# Patient Record
Sex: Female | Born: 1985
Health system: Southern US, Community
[De-identification: ages and names within clinical notes are randomized; demographics above are authoritative.]

## PROBLEM LIST (undated history)

## (undated) ENCOUNTER — Inpatient Hospital Stay (HOSPITAL_COMMUNITY): Payer: Self-pay

## (undated) DIAGNOSIS — D573 Sickle-cell trait: Secondary | ICD-10-CM

## (undated) DIAGNOSIS — R87629 Unspecified abnormal cytological findings in specimens from vagina: Secondary | ICD-10-CM

## (undated) DIAGNOSIS — I82409 Acute embolism and thrombosis of unspecified deep veins of unspecified lower extremity: Secondary | ICD-10-CM

## (undated) DIAGNOSIS — D571 Sickle-cell disease without crisis: Secondary | ICD-10-CM

## (undated) DIAGNOSIS — D649 Anemia, unspecified: Secondary | ICD-10-CM

## (undated) DIAGNOSIS — K219 Gastro-esophageal reflux disease without esophagitis: Secondary | ICD-10-CM

## (undated) DIAGNOSIS — F32A Depression, unspecified: Secondary | ICD-10-CM

## (undated) DIAGNOSIS — F419 Anxiety disorder, unspecified: Secondary | ICD-10-CM

## (undated) DIAGNOSIS — L309 Dermatitis, unspecified: Secondary | ICD-10-CM

## (undated) HISTORY — DX: Anemia, unspecified: D64.9

## (undated) HISTORY — DX: Gastro-esophageal reflux disease without esophagitis: K21.9

## (undated) HISTORY — PX: LEEP: SHX91

## (undated) HISTORY — DX: Anxiety disorder, unspecified: F41.9

---
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
## 2006-08-26 ENCOUNTER — Other Ambulatory Visit: Admission: RE | Admit: 2006-08-26 | Discharge: 2006-08-26 | Payer: Self-pay | Admitting: Family Medicine

## 2007-04-22 ENCOUNTER — Other Ambulatory Visit: Admission: RE | Admit: 2007-04-22 | Discharge: 2007-04-22 | Payer: Self-pay | Admitting: Obstetrics and Gynecology

## 2008-08-09 ENCOUNTER — Emergency Department (HOSPITAL_COMMUNITY): Admission: EM | Admit: 2008-08-09 | Discharge: 2008-08-09 | Payer: Self-pay | Admitting: Emergency Medicine

## 2009-03-12 ENCOUNTER — Emergency Department (HOSPITAL_COMMUNITY): Admission: EM | Admit: 2009-03-12 | Discharge: 2009-03-12 | Payer: Self-pay | Admitting: Emergency Medicine

## 2011-01-13 LAB — WET PREP, GENITAL
Trich, Wet Prep: NONE SEEN
Yeast Wet Prep HPF POC: NONE SEEN

## 2011-10-29 ENCOUNTER — Emergency Department (HOSPITAL_COMMUNITY)
Admission: EM | Admit: 2011-10-29 | Discharge: 2011-10-29 | Disposition: A | Discharge status: Home / Self Care | Payer: Medicaid Other | Attending: Emergency Medicine | Admitting: Emergency Medicine

## 2011-10-29 ENCOUNTER — Emergency Department (HOSPITAL_COMMUNITY): Payer: Medicaid Other

## 2011-10-29 ENCOUNTER — Encounter (HOSPITAL_COMMUNITY): Payer: Self-pay | Admitting: Family Medicine

## 2011-10-29 DIAGNOSIS — N83209 Unspecified ovarian cyst, unspecified side: Secondary | ICD-10-CM | POA: Insufficient documentation

## 2011-10-29 DIAGNOSIS — N390 Urinary tract infection, site not specified: Secondary | ICD-10-CM | POA: Insufficient documentation

## 2011-10-29 DIAGNOSIS — Z79899 Other long term (current) drug therapy: Secondary | ICD-10-CM | POA: Insufficient documentation

## 2011-10-29 DIAGNOSIS — N83299 Other ovarian cyst, unspecified side: Secondary | ICD-10-CM

## 2011-10-29 DIAGNOSIS — F17200 Nicotine dependence, unspecified, uncomplicated: Secondary | ICD-10-CM | POA: Insufficient documentation

## 2011-10-29 HISTORY — DX: Sickle-cell trait: D57.3

## 2011-10-29 LAB — URINE MICROSCOPIC-ADD ON

## 2011-10-29 LAB — URINALYSIS, ROUTINE W REFLEX MICROSCOPIC
Glucose, UA: NEGATIVE mg/dL
Protein, ur: NEGATIVE mg/dL
Specific Gravity, Urine: 1.026 (ref 1.005–1.030)
Urobilinogen, UA: 0.2 mg/dL (ref 0.0–1.0)

## 2011-10-29 LAB — WET PREP, GENITAL: Yeast Wet Prep HPF POC: NONE SEEN

## 2011-10-29 LAB — POCT PREGNANCY, URINE: Preg Test, Ur: NEGATIVE

## 2011-10-29 MED ORDER — HYDROCODONE-ACETAMINOPHEN 5-500 MG PO TABS: 1.0000 | ORAL_TABLET | Freq: Four times a day (QID) | ORAL | Status: AC | PRN

## 2011-10-29 MED ORDER — NITROFURANTOIN MONOHYD MACRO 100 MG PO CAPS: 100.0000 mg | ORAL_CAPSULE | Freq: Two times a day (BID) | ORAL | Status: AC

## 2011-10-29 NOTE — ED Notes (Signed)
Pt states she is unable to obtain urine specimen at this time.  

## 2011-10-29 NOTE — ED Notes (Signed)
Patient transported to Ultrasound 

## 2011-10-29 NOTE — ED Provider Notes (Signed)
History     CSN: [id number]  Arrival date & time 10/29/11  1257   First MD Initiated Contact with Patient 10/29/11 1540      Chief Complaint  Patient presents with  . Abdominal Pain    (Consider location/radiation/quality/duration/timing/severity/associated sxs/prior treatment) Patient is a 26 y.o. female presenting with abdominal pain. The history is provided by the patient.  Abdominal Pain The primary symptoms of the illness include abdominal pain and dysuria. The primary symptoms of the illness do not include fever, shortness of breath, nausea, vomiting, diarrhea, vaginal discharge or vaginal bleeding. Episode onset: 3 weeks. The onset of the illness was gradual. The problem has not changed since onset. The abdominal pain is located in the LLQ (Left pelvic region). The abdominal pain does not radiate. The severity of the abdominal pain is 8/10. The abdominal pain is relieved by nothing. The abdominal pain is exacerbated by urination, coughing and movement.  Onset: 3 weeks. The discomfort is mild. She is currently sexually active. The dysuria is not associated with discharge, hematuria, frequency, urgency or vaginal pain.  The patient states that she believes she is currently not pregnant. The patient has not had a change in bowel habit. Symptoms associated with the illness do not include chills, anorexia, constipation, urgency, hematuria, frequency or back pain. Significant associated medical issues do not include GERD, diabetes or gallstones.    Past Medical History  Diagnosis Date  . Sickle cell trait     History reviewed. No pertinent past surgical history.  History reviewed. No pertinent family history.  History  Substance Use Topics  . Smoking status: Current Everyday Smoker    Types: Cigarettes  . Smokeless tobacco: Not on file  . Alcohol Use: No    OB History    Grav Para Term Preterm Abortions TAB SAB Ect Mult Living                  Review of Systems    Constitutional: Negative for fever and chills.  Respiratory: Negative for shortness of breath.   Gastrointestinal: Positive for abdominal pain. Negative for nausea, vomiting, diarrhea, constipation and anorexia.  Genitourinary: Positive for dysuria. Negative for urgency, frequency, hematuria, vaginal bleeding, vaginal discharge and vaginal pain.  Musculoskeletal: Negative for back pain.  All other systems reviewed and are negative.    Allergies  Review of patient's allergies indicates no known allergies.  Home Medications   Current Outpatient Rx  Name Route Sig Dispense Refill  . ETONOGESTREL-ETHINYL ESTRADIOL 0.12-0.015 MG/24HR VA RING Vaginal Place 1 each vaginally every 28 (twenty-eight) days. Insert vaginally and leave in place for 3 consecutive weeks, then remove for 1 week.    Marland Kitchen FOLIC ACID 1 MG PO TABS Oral Take 1 mg by mouth daily.    Marland Kitchen HYDROCORTISONE 1 % EX CREA Topical Apply 1 application topically 2 (two) times daily. For ecezema    . IBUPROFEN 200 MG PO TABS Oral Take 600 mg by mouth every 6 (six) hours as needed. For headache    . ADULT MULTIVITAMIN W/MINERALS CH Oral Take 1 tablet by mouth daily.      BP 120/80  Pulse 85  Temp(Src) 98.8 F (37.1 C) (Oral)  Resp 18  Ht 5\' 1"  (1.549 m)  Wt 144 lb (65.318 kg)  BMI 27.21 kg/m2  SpO2 100%  LMP 10/05/2011  Physical Exam  Nursing note and vitals reviewed. Constitutional: She is oriented to person, place, and time. She appears well-developed and well-nourished. She appears distressed.  HENT:  Head: Normocephalic and atraumatic.  Eyes: EOM are normal. Pupils are equal, round, and reactive to light.  Cardiovascular: Normal rate, regular rhythm, normal heart sounds and intact distal pulses.  Exam reveals no friction rub.   No murmur heard. Pulmonary/Chest: Effort normal and breath sounds normal. She has no wheezes. She has no rales.  Abdominal: Soft. Bowel sounds are normal. She exhibits no distension. There is  tenderness in the left lower quadrant. There is no rebound, no guarding and no CVA tenderness.    Genitourinary: Right adnexum displays no mass and no tenderness. Left adnexum displays tenderness. Left adnexum displays no mass. No tenderness around the vagina. Vaginal discharge found.       Curd-like vaginal discharge in the vault  Musculoskeletal: Normal range of motion. She exhibits no tenderness.       No edema  Neurological: She is alert and oriented to person, place, and time. No cranial nerve deficit.  Skin: Skin is warm and dry. No rash noted.  Psychiatric: She has a normal mood and affect. Her behavior is normal.    ED Course  Procedures (including critical care time)  Labs Reviewed  URINALYSIS, ROUTINE W REFLEX MICROSCOPIC - Abnormal; Notable for the following:    APPearance CLOUDY (*)    Hgb urine dipstick TRACE (*)    Ketones, ur 40 (*)    Leukocytes, UA MODERATE (*)    All other components within normal limits  WET PREP, GENITAL - Abnormal; Notable for the following:    Clue Cells, Wet Prep RARE (*)    WBC, Wet Prep HPF POC FEW (*)    All other components within normal limits  URINE MICROSCOPIC-ADD ON - Abnormal; Notable for the following:    Squamous Epithelial / LPF MANY (*)    Bacteria, UA MANY (*)    All other components within normal limits  POCT PREGNANCY, URINE  POCT PREGNANCY, URINE  POCT PREGNANCY, URINE  GC/CHLAMYDIA PROBE AMP, GENITAL   US Transvaginal Non-ob  10/29/2011  *RADIOLOGY REPORT*  Clinical Data: Left lower quadrant pelvic pain.  TRANSABDOMINAL AND TRANSVAGINAL ULTRASOUND OF PELVIS Technique:  Both transabdominal and transvaginal ultrasound examinations of the pelvis were performed. Transabdominal technique was performed for global imaging of the pelvis including uterus, ovaries, adnexal regions, and pelvic cul-de-sac.  Comparison: None.   It was necessary to proceed with endovaginal exam following the transabdominal exam to visualize the  endometrium.  Findings:  Uterus: Uterus measures 6.7 x 2.7 x 3.5 cm and demonstrates normal myometrium.  No fibroid is observed.  Endometrium: Measures 6 mm in thickness, within normal limits.  Right ovary:  Measures 2.8 x 1.8 x 1.8 cm and appears normal.  Left ovary: Measures 5.2 x 3.3 x 4.8 cm and contains a complex and slightly septated cystic lesion measuring 3.6 x 2.9 x 3.2 cm. There is also a simple-appearing 1.9 x 1.3 x 1.1 cm left ovarian cyst or follicle.  Detailed Doppler interrogation of the ovary was not requested or performed, although color flow images appear to demonstrate some vascularity in the ovarian parenchyma making torsion unlikely.  There is trace free pelvic fluid adjacent to the left ovary.  IMPRESSION: 1.  Complex cystic lesion of the left ovary favors a hemorrhagic cyst.  Follow-up sonography in 6 weeks time may be warranted in order to ensure expected resolution.  There is also a small adjacent follicle or small cyst in the left ovary, and a trace amount of free pelvic fluid eccentric to  the left. 2.  The right ovary and uterus appear unremarkable.  Original Report Authenticated By: Dellia Cloud, M.D.   US Pelvis Complete  10/29/2011  *RADIOLOGY REPORT*  Clinical Data: Left lower quadrant pelvic pain.  TRANSABDOMINAL AND TRANSVAGINAL ULTRASOUND OF PELVIS Technique:  Both transabdominal and transvaginal ultrasound examinations of the pelvis were performed. Transabdominal technique was performed for global imaging of the pelvis including uterus, ovaries, adnexal regions, and pelvic cul-de-sac.  Comparison: None.   It was necessary to proceed with endovaginal exam following the transabdominal exam to visualize the endometrium.  Findings:  Uterus: Uterus measures 6.7 x 2.7 x 3.5 cm and demonstrates normal myometrium.  No fibroid is observed.  Endometrium: Measures 6 mm in thickness, within normal limits.  Right ovary:  Measures 2.8 x 1.8 x 1.8 cm and appears normal.  Left ovary:  Measures 5.2 x 3.3 x 4.8 cm and contains a complex and slightly septated cystic lesion measuring 3.6 x 2.9 x 3.2 cm. There is also a simple-appearing 1.9 x 1.3 x 1.1 cm left ovarian cyst or follicle.  Detailed Doppler interrogation of the ovary was not requested or performed, although color flow images appear to demonstrate some vascularity in the ovarian parenchyma making torsion unlikely.  There is trace free pelvic fluid adjacent to the left ovary.  IMPRESSION: 1.  Complex cystic lesion of the left ovary favors a hemorrhagic cyst.  Follow-up sonography in 6 weeks time may be warranted in order to ensure expected resolution.  There is also a small adjacent follicle or small cyst in the left ovary, and a trace amount of free pelvic fluid eccentric to the left. 2.  The right ovary and uterus appear unremarkable.  Original Report Authenticated By: Dellia Cloud, M.D.     No diagnosis found.    MDM   Patient history lower quadrant pain for 3 weeks which is mostly in the left pelvic region. It is worse with urination and movement. She denies any nausea, vomiting, diarrhea, vaginal discharge. She states the pain started after her menses which was at time. She has been using her Nuvaring and low likelihood of pregnancy. On exam she has mild left pelvic pain but no other concerning findings. She is otherwise well appearing.  On pelvic exam does appear to have yeast in the vaginal vault and only pain over the left ovary. Concern for either a ovarian cyst versus TOA and will get pelvic ultrasound. Wet prep and GC chlamydia done.  5:29 PM Ultrasound shows a complex left ovarian cyst which is the cause of the patient's pain. Also with bacteria and moderate leukocytes in her urine will treat for a urinary tract infection as the wet prep came back without any signs of yeast.      Gwyneth Sprout, MD 10/29/11 1732

## 2011-10-29 NOTE — ED Notes (Signed)
Pt reports having constant LLQ abdominal pain x3 weeks. Denis N/V/D. Denies vaginal bleeding/discharge or any urinary problems.

## 2012-01-19 ENCOUNTER — Emergency Department (HOSPITAL_COMMUNITY)
Admission: EM | Admit: 2012-01-19 | Discharge: 2012-01-19 | Disposition: A | Discharge status: Home / Self Care | Payer: Medicaid Other | Attending: Emergency Medicine | Admitting: Emergency Medicine

## 2012-01-19 ENCOUNTER — Encounter (HOSPITAL_COMMUNITY): Payer: Self-pay

## 2012-01-19 ENCOUNTER — Emergency Department (HOSPITAL_COMMUNITY): Payer: Medicaid Other

## 2012-01-19 DIAGNOSIS — S62339A Displaced fracture of neck of unspecified metacarpal bone, initial encounter for closed fracture: Secondary | ICD-10-CM

## 2012-01-19 MED ORDER — ONDANSETRON 4 MG PO TBDP: 4.0000 mg | ORAL_TABLET | Freq: Three times a day (TID) | ORAL | Status: AC | PRN

## 2012-01-19 MED ORDER — HYDROCODONE-ACETAMINOPHEN 5-325 MG PO TABS: 1.0000 | ORAL_TABLET | ORAL | Status: AC | PRN
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
## 2012-01-20 NOTE — ED Provider Notes (Signed)
Medical screening examination/treatment/procedure(s) were performed by non-physician practitioner and as supervising physician I was immediately available for consultation/collaboration.  Ethelda Chick, MD 01/20/12 [phone]

## 2012-02-11 ENCOUNTER — Emergency Department (HOSPITAL_COMMUNITY)
Admission: EM | Admit: 2012-02-11 | Discharge: 2012-02-11 | Disposition: A | Discharge status: Home / Self Care | Payer: Medicaid Other | Attending: Emergency Medicine | Admitting: Emergency Medicine

## 2012-02-11 ENCOUNTER — Emergency Department (HOSPITAL_COMMUNITY): Payer: Medicaid Other

## 2012-02-11 DIAGNOSIS — X58XXXA Exposure to other specified factors, initial encounter: Secondary | ICD-10-CM | POA: Insufficient documentation

## 2012-02-11 DIAGNOSIS — S62339A Displaced fracture of neck of unspecified metacarpal bone, initial encounter for closed fracture: Secondary | ICD-10-CM | POA: Insufficient documentation

## 2012-02-11 DIAGNOSIS — D573 Sickle-cell trait: Secondary | ICD-10-CM | POA: Insufficient documentation

## 2012-02-11 DIAGNOSIS — S92301P Fracture of unspecified metatarsal bone(s), right foot, subsequent encounter for fracture with malunion: Secondary | ICD-10-CM

## 2012-02-11 DIAGNOSIS — F17200 Nicotine dependence, unspecified, uncomplicated: Secondary | ICD-10-CM | POA: Insufficient documentation

## 2012-02-11 DIAGNOSIS — M25449 Effusion, unspecified hand: Secondary | ICD-10-CM | POA: Insufficient documentation

## 2012-02-11 DIAGNOSIS — M25549 Pain in joints of unspecified hand: Secondary | ICD-10-CM | POA: Insufficient documentation

## 2012-02-11 MED ORDER — IBUPROFEN 600 MG PO TABS: 600.0000 mg | ORAL_TABLET | Freq: Four times a day (QID) | ORAL | Status: AC | PRN
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
## 2012-02-11 NOTE — ED Notes (Signed)
Pt left before receiving discharge instructions.
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
## 2012-02-11 NOTE — ED Provider Notes (Signed)
Medical screening examination/treatment/procedure(s) were performed by non-physician practitioner and as supervising physician I was immediately available for consultation/collaboration.  Flint Melter, MD 02/11/12 2126

## 2012-02-11 NOTE — Discharge Instructions (Signed)
Please follow up with hand specialist, see referral.

## 2012-07-30 ENCOUNTER — Encounter (HOSPITAL_COMMUNITY): Payer: Self-pay | Admitting: Emergency Medicine

## 2012-07-30 ENCOUNTER — Emergency Department (HOSPITAL_COMMUNITY): Payer: BC Managed Care – PPO

## 2012-07-30 ENCOUNTER — Emergency Department (HOSPITAL_COMMUNITY)
Admission: EM | Admit: 2012-07-30 | Discharge: 2012-07-30 | Disposition: A | Discharge status: Home / Self Care | Payer: BC Managed Care – PPO | Attending: Emergency Medicine | Admitting: Emergency Medicine

## 2012-07-30 DIAGNOSIS — S62306A Unspecified fracture of fifth metacarpal bone, right hand, initial encounter for closed fracture: Secondary | ICD-10-CM

## 2012-07-30 DIAGNOSIS — Y929 Unspecified place or not applicable: Secondary | ICD-10-CM | POA: Insufficient documentation

## 2012-07-30 DIAGNOSIS — S62309A Unspecified fracture of unspecified metacarpal bone, initial encounter for closed fracture: Secondary | ICD-10-CM | POA: Insufficient documentation

## 2012-07-30 DIAGNOSIS — W108XXA Fall (on) (from) other stairs and steps, initial encounter: Secondary | ICD-10-CM | POA: Insufficient documentation

## 2012-07-30 DIAGNOSIS — Y939 Activity, unspecified: Secondary | ICD-10-CM | POA: Insufficient documentation

## 2012-07-30 MED ORDER — OXYCODONE-ACETAMINOPHEN 5-325 MG PO TABS
1.0000 | ORAL_TABLET | Freq: Once | ORAL | Status: AC
  Administered 2012-07-30: 1 via ORAL
  Filled 2012-07-30: qty 1

## 2012-07-30 MED ORDER — OXYCODONE-ACETAMINOPHEN 5-325 MG PO TABS: ORAL_TABLET | ORAL | Status: DC

## 2012-07-30 MED ORDER — IBUPROFEN 800 MG PO TABS
800.0000 mg | ORAL_TABLET | Freq: Once | ORAL | Status: AC
  Administered 2012-07-30: 800 mg via ORAL
  Filled 2012-07-30: qty 1
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
## 2012-07-30 NOTE — ED Provider Notes (Signed)
Medical screening examination/treatment/procedure(s) were performed by non-physician practitioner and as supervising physician I was immediately available for consultation/collaboration.  Alando Colleran, MD 07/30/12 2324 

## 2012-07-30 NOTE — Progress Notes (Signed)
Pt reports pcp is "[facility]" Pt listed with blue cross blue shield and medicaid coverage Aware how to obtain a pcp via toll free number/website
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
## 2012-07-30 NOTE — ED Notes (Signed)
Patient given discharge instructions, information, prescriptions, and diet order. Patient states that they adequately understand discharge information given and to return to ED if symptoms return or worsen.     

## 2013-01-17 ENCOUNTER — Emergency Department (HOSPITAL_COMMUNITY)
Admission: EM | Admit: 2013-01-17 | Discharge: 2013-01-17 | Disposition: A | Discharge status: Home / Self Care | Payer: BC Managed Care – PPO | Attending: Emergency Medicine | Admitting: Emergency Medicine

## 2013-01-17 ENCOUNTER — Emergency Department (HOSPITAL_COMMUNITY): Payer: BC Managed Care – PPO

## 2013-01-17 ENCOUNTER — Encounter (HOSPITAL_COMMUNITY): Payer: Self-pay | Admitting: *Deleted

## 2013-01-17 DIAGNOSIS — S448X9A Injury of other nerves at shoulder and upper arm level, unspecified arm, initial encounter: Secondary | ICD-10-CM | POA: Insufficient documentation

## 2013-01-17 DIAGNOSIS — Y9389 Activity, other specified: Secondary | ICD-10-CM | POA: Insufficient documentation

## 2013-01-17 DIAGNOSIS — Y92410 Unspecified street and highway as the place of occurrence of the external cause: Secondary | ICD-10-CM | POA: Insufficient documentation

## 2013-01-17 DIAGNOSIS — S99929A Unspecified injury of unspecified foot, initial encounter: Secondary | ICD-10-CM | POA: Insufficient documentation

## 2013-01-17 DIAGNOSIS — M25511 Pain in right shoulder: Secondary | ICD-10-CM

## 2013-01-17 DIAGNOSIS — M25561 Pain in right knee: Secondary | ICD-10-CM

## 2013-01-17 DIAGNOSIS — D573 Sickle-cell trait: Secondary | ICD-10-CM | POA: Insufficient documentation

## 2013-01-17 DIAGNOSIS — S8990XA Unspecified injury of unspecified lower leg, initial encounter: Secondary | ICD-10-CM | POA: Insufficient documentation

## 2013-01-17 MED ORDER — IBUPROFEN 800 MG PO TABS
800.0000 mg | ORAL_TABLET | Freq: Once | ORAL | Status: AC
  Administered 2013-01-17: 800 mg via ORAL
  Filled 2013-01-17: qty 1

## 2013-01-17 MED ORDER — TRAMADOL HCL 50 MG PO TABS: 50.0000 mg | ORAL_TABLET | Freq: Four times a day (QID) | ORAL | Status: DC | PRN

## 2013-01-17 MED ORDER — METHOCARBAMOL 500 MG PO TABS: 500.0000 mg | ORAL_TABLET | Freq: Two times a day (BID) | ORAL | Status: DC
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
## 2013-01-18 NOTE — ED Provider Notes (Signed)
Medical screening examination/treatment/procedure(s) were performed by non-physician practitioner and as supervising physician I was immediately available for consultation/collaboration.   Joan Capri L Chaselyn Nanney, MD 01/18/13 0747 

## 2013-03-17 ENCOUNTER — Encounter (HOSPITAL_COMMUNITY): Payer: Self-pay | Admitting: *Deleted

## 2013-03-17 ENCOUNTER — Emergency Department (HOSPITAL_COMMUNITY)
Admission: EM | Admit: 2013-03-17 | Discharge: 2013-03-17 | Disposition: A | Discharge status: Home / Self Care | Payer: BC Managed Care – PPO | Attending: Emergency Medicine | Admitting: Emergency Medicine

## 2013-03-17 ENCOUNTER — Emergency Department (HOSPITAL_COMMUNITY): Payer: BC Managed Care – PPO

## 2013-03-17 DIAGNOSIS — K921 Melena: Secondary | ICD-10-CM | POA: Insufficient documentation

## 2013-03-17 DIAGNOSIS — R142 Eructation: Secondary | ICD-10-CM | POA: Insufficient documentation

## 2013-03-17 DIAGNOSIS — Z79899 Other long term (current) drug therapy: Secondary | ICD-10-CM | POA: Insufficient documentation

## 2013-03-17 DIAGNOSIS — Z3202 Encounter for pregnancy test, result negative: Secondary | ICD-10-CM | POA: Insufficient documentation

## 2013-03-17 DIAGNOSIS — R141 Gas pain: Secondary | ICD-10-CM | POA: Insufficient documentation

## 2013-03-17 DIAGNOSIS — Z791 Long term (current) use of non-steroidal anti-inflammatories (NSAID): Secondary | ICD-10-CM | POA: Insufficient documentation

## 2013-03-17 DIAGNOSIS — K219 Gastro-esophageal reflux disease without esophagitis: Secondary | ICD-10-CM | POA: Insufficient documentation

## 2013-03-17 DIAGNOSIS — Z862 Personal history of diseases of the blood and blood-forming organs and certain disorders involving the immune mechanism: Secondary | ICD-10-CM | POA: Insufficient documentation

## 2013-03-17 DIAGNOSIS — K5900 Constipation, unspecified: Secondary | ICD-10-CM | POA: Insufficient documentation

## 2013-03-17 LAB — URINALYSIS, ROUTINE W REFLEX MICROSCOPIC
Leukocytes, UA: NEGATIVE
Nitrite: NEGATIVE
Specific Gravity, Urine: 1.024 (ref 1.005–1.030)
pH: 6 (ref 5.0–8.0)

## 2013-03-17 MED ORDER — GI COCKTAIL ~~LOC~~
30.0000 mL | Freq: Once | ORAL | Status: AC
  Administered 2013-03-17: 30 mL via ORAL
  Filled 2013-03-17: qty 30

## 2013-03-17 MED ORDER — POLYETHYLENE GLYCOL 3350 17 GM/SCOOP PO POWD: 17.0000 g | Freq: Every day | ORAL | Status: DC

## 2013-03-17 MED ORDER — RANITIDINE HCL 150 MG PO TABS: 150.0000 mg | ORAL_TABLET | Freq: Two times a day (BID) | ORAL | Status: DC

## 2013-03-17 MED ORDER — PEG 3350-KCL-NABCB-NACL-NASULF 236 G PO SOLR: ORAL | Status: DC

## 2013-03-17 NOTE — ED Provider Notes (Signed)
History     CSN: [id number]  Arrival date & time 03/17/13  1217   First MD Initiated Contact with Patient 03/17/13 1221      No chief complaint on file.   (Consider location/radiation/quality/duration/timing/severity/associated sxs/prior treatment) HPI Joan Knight is a 27 y.o. female who presents to ED with complaint of constipation. States this has been happening for 3 years. States daily has abdominal bloating, and states "stomach feels full." denies any pain. Denies fever, chills, nausea, vomiting. Today noted bright red blood in stool. States has to use suppositories every day to have a bowel movement. States tried laxitives in the past and states "they do not work." States she eats healthy, with plenty of green leafy vegetables from her garden. States last bowel movement 4 days ago, no relief in the last few days with suppositories.    Past Medical History  Diagnosis Date  . Sickle cell trait     History reviewed. No pertinent past surgical history.  History reviewed. No pertinent family history.  History  Substance Use Topics  . Smoking status: Never Smoker   . Smokeless tobacco: Not on file  . Alcohol Use: No    OB History   Grav Para Term Preterm Abortions TAB SAB Ect Mult Living                  Review of Systems  Constitutional: Negative for fever and chills.  Gastrointestinal: Positive for constipation, blood in stool and abdominal distention.  All other systems reviewed and are negative.    Allergies  Review of patient's allergies indicates no known allergies.  Home Medications   Current Outpatient Rx  Name  Route  Sig  Dispense  Refill  . calcium carbonate (TUMS - DOSED IN MG ELEMENTAL CALCIUM) 500 MG chewable tablet   Oral   Chew 1 tablet by mouth daily.         Marland Kitchen etonogestrel-ethinyl estradiol (NUVARING) 0.12-0.015 MG/24HR vaginal ring   Vaginal   Place 1 each vaginally every 28 (twenty-eight) days. Insert vaginally and leave in place  for 3 consecutive weeks, then remove for 1 week.         . gabapentin (NEURONTIN) 300 MG capsule   Oral   Take 300 mg by mouth at bedtime.         . meloxicam (MOBIC) 15 MG tablet   Oral   Take 15 mg by mouth daily.         . methocarbamol (ROBAXIN) 500 MG tablet   Oral   Take 500 mg by mouth 2 (two) times daily.         . Multiple Vitamin (MULTIVITAMIN WITH MINERALS) TABS   Oral   Take 1 tablet by mouth daily.         . traMADol (ULTRAM) 50 MG tablet   Oral   Take 50 mg by mouth every 6 (six) hours as needed for pain.           There were no vitals taken for this visit.  Physical Exam  Nursing note and vitals reviewed. Constitutional: She appears well-developed and well-nourished. No distress.  Neck: Neck supple.  Cardiovascular: Normal rate, regular rhythm and normal heart sounds.   Pulmonary/Chest: Effort normal and breath sounds normal. No respiratory distress. She has no wheezes. She has no rales.  Abdominal: Soft. Bowel sounds are normal. She exhibits no distension and no mass. There is tenderness. There is no rebound and no guarding.  Diffuse mild tenderness  Musculoskeletal: She exhibits no edema.  Neurological: She is alert.  Skin: Skin is warm and dry.    ED Course  Procedures (including critical care time)  Results for orders placed during the hospital encounter of 03/17/13  URINALYSIS, ROUTINE W REFLEX MICROSCOPIC      Result Value Range   Color, Urine YELLOW  YELLOW   APPearance CLOUDY (*) CLEAR   Specific Gravity, Urine 1.024  1.005 - 1.030   pH 6.0  5.0 - 8.0   Glucose, UA NEGATIVE  NEGATIVE mg/dL   Hgb urine dipstick NEGATIVE  NEGATIVE   Bilirubin Urine NEGATIVE  NEGATIVE   Ketones, ur NEGATIVE  NEGATIVE mg/dL   Protein, ur NEGATIVE  NEGATIVE mg/dL   Urobilinogen, UA 0.2  0.0 - 1.0 mg/dL   Nitrite NEGATIVE  NEGATIVE   Leukocytes, UA NEGATIVE  NEGATIVE  POCT PREGNANCY, URINE      Result Value Range   Preg Test, Ur NEGATIVE   NEGATIVE   Dg Abd 2 Views  03/17/2013   *RADIOLOGY REPORT*  Clinical Data: Abdominal pain.  No bowel movement.  Sickle cell disease.  ABDOMEN - 2 VIEW  Comparison: None.  Findings: Normal bowel gas pattern without free peritoneal air. Mildly prominent stool throughout the colon.  Unremarkable bones.  IMPRESSION: No acute abnormality.  Mildly prominent stool.   Original Report Authenticated By: Beckie Salts, M.D.     1. Constipation   2. Acid reflux       MDM  Pt with large stool on abdominal x-ray. Pt is dependant on suppositories, which i think is partially why pt is having issues with constipation. Also now mentioning reflux symptoms. States burning into chest and throat. Instructed to stop suppositories. Will try polyethylene glycol for constipation and daily for bowel routine until regular bowel movements then get off of laxitives. Also will start on ranitidine for reflux. Follow up with gastroenterologist. Pt does not have surgical abdomen. She is in no distress. Symptoms are chronic.   Filed Vitals:   03/17/13 1322  BP: 122/69  Pulse: 52  Temp: 98.6 F (37 C)  TempSrc: Oral  Resp: 18  SpO2: 100%           Lottie Mussel, PA-C 03/17/13 1939

## 2013-03-17 NOTE — Progress Notes (Signed)
[facility] ED CM noted pt with coverage but no pcp listed Spoke with pt who confirms no pcp but  [facility] ED CM spoke with pt on how to obtain an in network pcp with insurance coverage via the customer service number or web site  She reports she had medicaid (family planning) and was seeing Dr Inger at that time (last seen "two to three years ago"_but now since she is in "school" she has blue cross and blue shield coverage  Cm answered questions for pt about how a pcp can be obtained after college  

## 2013-03-17 NOTE — ED Notes (Signed)
Pt presents to ed with c/o chronic constipation that is getting worse. Pt sts has to use laxative suppositories every day in order to have bm, sts last bm was on Sunday, 6/8. Pt sts she is able to eat and drink as usual.

## 2013-03-18 NOTE — ED Provider Notes (Signed)
Medical screening examination/treatment/procedure(s) were performed by non-physician practitioner and as supervising physician I was immediately available for consultation/collaboration.   Benny Lennert, MD 03/18/13 [phone]

## 2013-05-16 ENCOUNTER — Other Ambulatory Visit (HOSPITAL_COMMUNITY): Payer: Self-pay | Admitting: Pain Medicine

## 2013-05-16 ENCOUNTER — Encounter (HOSPITAL_COMMUNITY): Payer: Self-pay | Admitting: Emergency Medicine

## 2013-05-16 ENCOUNTER — Emergency Department (HOSPITAL_COMMUNITY)
Admission: EM | Admit: 2013-05-16 | Discharge: 2013-05-16 | Disposition: A | Discharge status: Home / Self Care | Payer: BC Managed Care – PPO | Attending: Emergency Medicine | Admitting: Emergency Medicine

## 2013-05-16 ENCOUNTER — Ambulatory Visit (HOSPITAL_COMMUNITY)
Admission: RE | Admit: 2013-05-16 | Discharge: 2013-05-16 | Disposition: A | Discharge status: Home / Self Care | Payer: BC Managed Care – PPO | Source: Ambulatory Visit | Attending: Pain Medicine | Admitting: Pain Medicine

## 2013-05-16 DIAGNOSIS — Z862 Personal history of diseases of the blood and blood-forming organs and certain disorders involving the immune mechanism: Secondary | ICD-10-CM | POA: Insufficient documentation

## 2013-05-16 DIAGNOSIS — F17200 Nicotine dependence, unspecified, uncomplicated: Secondary | ICD-10-CM | POA: Insufficient documentation

## 2013-05-16 DIAGNOSIS — Z87828 Personal history of other (healed) physical injury and trauma: Secondary | ICD-10-CM | POA: Insufficient documentation

## 2013-05-16 DIAGNOSIS — M7989 Other specified soft tissue disorders: Secondary | ICD-10-CM

## 2013-05-16 DIAGNOSIS — K5900 Constipation, unspecified: Secondary | ICD-10-CM | POA: Insufficient documentation

## 2013-05-16 DIAGNOSIS — I82409 Acute embolism and thrombosis of unspecified deep veins of unspecified lower extremity: Secondary | ICD-10-CM | POA: Insufficient documentation

## 2013-05-16 DIAGNOSIS — R51 Headache: Secondary | ICD-10-CM | POA: Insufficient documentation

## 2013-05-16 DIAGNOSIS — I82401 Acute embolism and thrombosis of unspecified deep veins of right lower extremity: Secondary | ICD-10-CM

## 2013-05-16 DIAGNOSIS — Z3202 Encounter for pregnancy test, result negative: Secondary | ICD-10-CM | POA: Insufficient documentation

## 2013-05-16 DIAGNOSIS — Z79899 Other long term (current) drug therapy: Secondary | ICD-10-CM | POA: Insufficient documentation

## 2013-05-16 DIAGNOSIS — D72829 Elevated white blood cell count, unspecified: Secondary | ICD-10-CM | POA: Insufficient documentation

## 2013-05-16 DIAGNOSIS — M79609 Pain in unspecified limb: Secondary | ICD-10-CM | POA: Insufficient documentation

## 2013-05-16 DIAGNOSIS — M549 Dorsalgia, unspecified: Secondary | ICD-10-CM | POA: Insufficient documentation

## 2013-05-16 LAB — CBC
Platelets: 291 10*3/uL (ref 150–400)
RBC: 4.23 MIL/uL (ref 3.87–5.11)
WBC: 12.6 10*3/uL — ABNORMAL HIGH (ref 4.0–10.5)

## 2013-05-16 LAB — BASIC METABOLIC PANEL
CO2: 25 mEq/L (ref 19–32)
Calcium: 9.4 mg/dL (ref 8.4–10.5)
Chloride: 102 mEq/L (ref 96–112)
Sodium: 139 mEq/L (ref 135–145)

## 2013-05-16 MED ORDER — RIVAROXABAN 15 MG PO TABS: 15.0000 mg | ORAL_TABLET | Freq: Two times a day (BID) | ORAL | Status: DC

## 2013-05-16 NOTE — ED Notes (Signed)
Pt had right leg vascular study today that was positive for DVT; pt sts pain and swelling to right leg and calf x 2 weeks
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
## 2013-05-16 NOTE — ED Notes (Signed)
Pt presents with right calf pain for 2 weeks. Had a vascular study performed today and was positive for a DVT.

## 2013-05-16 NOTE — ED Notes (Addendum)
Per [employer], [place], POC urine pregnancy result was negative.

## 2013-05-16 NOTE — ED Notes (Signed)
PT aware Urine sample needed. Will try to void again.

## 2013-05-16 NOTE — Progress Notes (Addendum)
05/16/13/  1936 Beecher Mcardle RN BSN ED CM consulted to [facility] E-41 by EDP . Pt presents to ED with Rt.leg pain.  In to speak with patient in regards to medication assistance for Xarelto, and follow up care.  Pt states that she has BCBS but does not have a local PCP. Provided pt. with verbal and written information about the [facility]. Provided and went over the [employer] discharge kit, and how to enroll. Explained that this trail offer is for a free 30 day supply of Xeralto, and she can fill prescription without a co- pay at any pharmacy. Pt verbalizes understanding and agrees with plan. Pt being discharged to self-care. Pt instructed to call to make appt with clinic, I will also send e-mail notification to [facility] to contact her for follow up. Dr. Lou Miner made aware of discharge plan. No further CM needs identified.

## 2013-05-16 NOTE — Progress Notes (Signed)
*  PRELIMINARY RESULTS* Vascular Ultrasound Lower extremity venous duplex has been completed.  Preliminary findings: Right = DVT involving the femoral vein, popliteal vein, posterior tibial veins, and peroneal veins. Left = no evidence of DVT.  Called report to Dr. Olevia Perches. Patient has been instructed to go to [facility] ED to start treatment.    Farrel Demark, RDMS, RVT  05/16/2013, 2:34 PM

## 2013-05-17 LAB — POCT PREGNANCY, URINE: Preg Test, Ur: NEGATIVE

## 2013-05-17 NOTE — ED Provider Notes (Signed)
I saw and evaluated the patient, reviewed the resident's note and I agree with the findings and plan.   No signs of progression of DVT. D/w social work in ED to help set up f/u for DVT care as outpatient, and will treat with xarelto  Audree Camel, MD 05/17/13 1328

## 2013-05-31 ENCOUNTER — Ambulatory Visit: Payer: BC Managed Care – PPO | Attending: Internal Medicine | Admitting: Internal Medicine

## 2013-05-31 MED ORDER — RIVAROXABAN 20 MG PO TABS: 20.0000 mg | ORAL_TABLET | Freq: Every day | ORAL | Status: DC

## 2013-05-31 MED ORDER — RIVAROXABAN 15 MG PO TABS: 15.0000 mg | ORAL_TABLET | Freq: Two times a day (BID) | ORAL | Status: DC

## 2013-05-31 NOTE — Progress Notes (Unsigned)
Patient here for medication refill Needs  xarelto

## 2013-05-31 NOTE — Progress Notes (Unsigned)
Patient ID: Joan Knight, female   DOB: 05/19/1986, 27 y.o.   MRN: [id number]  CC:  HPI: 27 year old female is here to establish care, she was recently seen in the ER on 8/11 forRight = DVT involving the femoral vein, popliteal vein, posterior tibial veins, and peroneal veins. Patient is on NuvaRing. She was started on xarelto . Patient has a history of sickle cell trait and is on hormonal contraception, both of which increase her risk of DVT.  She states that the patient has swelling, cramping if she walks. Denies family history of DVT and PE    No Known Allergies Past Medical History  Diagnosis Date  . Sickle cell trait    Current Outpatient Prescriptions on File Prior to Visit  Medication Sig Dispense Refill  . calcium carbonate (TUMS - DOSED IN MG ELEMENTAL CALCIUM) 500 MG chewable tablet Chew 1 tablet by mouth daily.      . diclofenac sodium (VOLTAREN) 1 % GEL Apply 2 g topically 4 (four) times daily as needed (for pain).      Marland Kitchen etonogestrel-ethinyl estradiol (NUVARING) 0.12-0.015 MG/24HR vaginal ring Place 1 each vaginally every 28 (twenty-eight) days. Insert vaginally and leave in place for 3 consecutive weeks, then remove for 1 week.      . gabapentin (NEURONTIN) 300 MG capsule Take 300 mg by mouth at bedtime.      . meloxicam (MOBIC) 15 MG tablet Take 15 mg by mouth daily.      . methocarbamol (ROBAXIN) 500 MG tablet Take 500 mg by mouth 2 (two) times daily.      . Multiple Vitamin (MULTIVITAMIN WITH MINERALS) TABS Take 1 tablet by mouth daily.      . traMADol (ULTRAM) 50 MG tablet Take 50 mg by mouth every 6 (six) hours as needed for pain.       No current facility-administered medications on file prior to visit.   No family history on file. History   Social History  . Marital Status: Single    Spouse Name: N/A    Number of Children: N/A  . Years of Education: N/A   Occupational History  . Not on file.   Social History Main Topics  . Smoking status: Current  Every Day Smoker  . Smokeless tobacco: Not on file  . Alcohol Use: No  . Drug Use: No  . Sexual Activity: Not on file   Other Topics Concern  . Not on file   Social History Narrative  . No narrative on file    Review of Systems  Constitutional: Negative for fever, chills, diaphoresis, activity change, appetite change and fatigue.  HENT: Negative for ear pain, nosebleeds, congestion, facial swelling, rhinorrhea, neck pain, neck stiffness and ear discharge.   Eyes: Negative for pain, discharge, redness, itching and visual disturbance.  Respiratory: Negative for cough, choking, chest tightness, shortness of breath, wheezing and stridor.   Cardiovascular: Negative for chest pain, palpitations and leg swelling.  Gastrointestinal: Negative for abdominal distention.  Genitourinary: Negative for dysuria, urgency, frequency, hematuria, flank pain, decreased urine volume, difficulty urinating and dyspareunia.  Musculoskeletal: Negative for back pain, joint swelling, arthralgias and gait problem.  Neurological: Negative for dizziness, tremors, seizures, syncope, facial asymmetry, speech difficulty, weakness, light-headedness, numbness and headaches.  Hematological: Negative for adenopathy. Does not bruise/bleed easily.  Psychiatric/Behavioral: Negative for hallucinations, behavioral problems, confusion, dysphoric mood, decreased concentration and agitation.    Objective:   Filed Vitals:   05/31/13 1038  BP: 116/80  Pulse: 100  Temp: 98 F (36.7 C)  Resp: 18    Physical Exam  Constitutional: Appears well-developed and well-nourished. No distress.  HENT: Normocephalic. External right and left ear normal. Oropharynx is clear and moist.  Eyes: Conjunctivae and EOM are normal. PERRLA, no scleral icterus.  Neck: Normal ROM. Neck supple. No JVD. No tracheal deviation. No thyromegaly.  CVS: RRR, S1/S2 +, no murmurs, no gallops, no carotid bruit.  Pulmonary: Effort and breath sounds normal,  no stridor, rhonchi, wheezes, rales.  Abdominal: Soft. BS +,  no distension, tenderness, rebound or guarding.  Musculoskeletal: Normal range of motion. No edema and no tenderness.  Lymphadenopathy: No lymphadenopathy noted, cervical, inguinal. Neuro: Alert. Normal reflexes, muscle tone coordination. No cranial nerve deficit. Skin: Skin is warm and dry. No rash noted. Not diaphoretic. No erythema. No pallor.  Psychiatric: Normal mood and affect. Behavior, judgment, thought content normal.   Lab Results  Component Value Date   WBC 12.6* 05/16/2013   HGB 13.2 05/16/2013   HCT 36.1 05/16/2013   MCV 85.3 05/16/2013   PLT 291 05/16/2013   Lab Results  Component Value Date   CREATININE 0.70 05/16/2013   BUN 13 05/16/2013   NA 139 05/16/2013   K 3.5 05/16/2013   CL 102 05/16/2013   CO2 25 05/16/2013    No results found for this basename: HGBA1C   Lipid Panel  No results found for this basename: chol, trig, hdl, cholhdl, vldl, ldlcalc       Assessment and plan:   There are no active problems to display for this patient.  DVT Patient will continue xarelto Due to postphlebitic syndrome, the patient will continue to have leg cramps, swelling, discomfort for at least a few weeks. I have recommended the patient, to sit down for intermittent periods of time during the day and to avoid prolonged standing. She should also discontinue hormonal contraception because of her DVT and use barrier methods only         The patient was given clear instructions to go to ER or return to medical center if symptoms don't improve, worsen or new problems develop. The patient verbalized understanding. The patient was told to call to get any lab results if not heard anything in the next week.

## 2013-06-24 ENCOUNTER — Encounter (HOSPITAL_COMMUNITY): Payer: Self-pay | Admitting: Emergency Medicine

## 2013-06-24 ENCOUNTER — Emergency Department (HOSPITAL_COMMUNITY)
Admission: EM | Admit: 2013-06-24 | Discharge: 2013-06-24 | Disposition: A | Discharge status: Home / Self Care | Payer: BC Managed Care – PPO | Attending: Emergency Medicine | Admitting: Emergency Medicine

## 2013-06-24 DIAGNOSIS — Z862 Personal history of diseases of the blood and blood-forming organs and certain disorders involving the immune mechanism: Secondary | ICD-10-CM | POA: Insufficient documentation

## 2013-06-24 DIAGNOSIS — Z79899 Other long term (current) drug therapy: Secondary | ICD-10-CM | POA: Insufficient documentation

## 2013-06-24 DIAGNOSIS — F17200 Nicotine dependence, unspecified, uncomplicated: Secondary | ICD-10-CM | POA: Insufficient documentation

## 2013-06-24 DIAGNOSIS — N12 Tubulo-interstitial nephritis, not specified as acute or chronic: Secondary | ICD-10-CM | POA: Insufficient documentation

## 2013-06-24 DIAGNOSIS — Z3202 Encounter for pregnancy test, result negative: Secondary | ICD-10-CM | POA: Insufficient documentation

## 2013-06-24 DIAGNOSIS — H0014 Chalazion left upper eyelid: Secondary | ICD-10-CM

## 2013-06-24 DIAGNOSIS — H0019 Chalazion unspecified eye, unspecified eyelid: Secondary | ICD-10-CM | POA: Insufficient documentation

## 2013-06-24 LAB — CBC WITH DIFFERENTIAL/PLATELET
Basophils Absolute: 0.1 10*3/uL (ref 0.0–0.1)
Basophils Relative: 1 % (ref 0–1)
Eosinophils Absolute: 0.2 10*3/uL (ref 0.0–0.7)
Lymphs Abs: 3 10*3/uL (ref 0.7–4.0)
MCH: 30.6 pg (ref 26.0–34.0)
MCHC: 36.6 g/dL — ABNORMAL HIGH (ref 30.0–36.0)
Neutrophils Relative %: 54 % (ref 43–77)
Platelets: 276 10*3/uL (ref 150–400)
RBC: 4.51 MIL/uL (ref 3.87–5.11)
RDW: 12.7 % (ref 11.5–15.5)

## 2013-06-24 LAB — BASIC METABOLIC PANEL
Calcium: 9.8 mg/dL (ref 8.4–10.5)
GFR calc Af Amer: >90 mL/min (ref >90)
GFR calc non Af Amer: >90 mL/min (ref >90)
Potassium: 3.1 mEq/L — ABNORMAL LOW (ref 3.5–5.1)
Sodium: 137 mEq/L (ref 135–145)

## 2013-06-24 LAB — URINALYSIS, ROUTINE W REFLEX MICROSCOPIC
Ketones, ur: NEGATIVE mg/dL
Nitrite: POSITIVE — AB
Specific Gravity, Urine: 1.017 (ref 1.005–1.030)
Urobilinogen, UA: 0.2 mg/dL (ref 0.0–1.0)
pH: 6.5 (ref 5.0–8.0)

## 2013-06-24 MED ORDER — ONDANSETRON HCL 4 MG PO TABS: 4.0000 mg | ORAL_TABLET | Freq: Four times a day (QID) | ORAL | Status: DC

## 2013-06-24 MED ORDER — CIPROFLOXACIN HCL 500 MG PO TABS: 500.0000 mg | ORAL_TABLET | Freq: Two times a day (BID) | ORAL | Status: DC

## 2013-06-24 MED ORDER — SODIUM CHLORIDE 0.9 % IV BOLUS (SEPSIS)
1000.0000 mL | Freq: Once | INTRAVENOUS | Status: AC
  Administered 2013-06-24: 1000 mL via INTRAVENOUS

## 2013-06-24 MED ORDER — MORPHINE SULFATE 4 MG/ML IJ SOLN
4.0000 mg | Freq: Once | INTRAMUSCULAR | Status: AC
  Administered 2013-06-24: 4 mg via INTRAVENOUS
  Filled 2013-06-24: qty 1

## 2013-06-24 MED ORDER — HYDROCODONE-ACETAMINOPHEN 5-325 MG PO TABS: 1.0000 | ORAL_TABLET | ORAL | Status: DC | PRN

## 2013-06-24 MED ORDER — ERYTHROMYCIN 5 MG/GM OP OINT: TOPICAL_OINTMENT | OPHTHALMIC | Status: DC

## 2013-06-24 MED ORDER — DEXTROSE 5 % IV SOLN
1.0000 g | Freq: Once | INTRAVENOUS | Status: AC
  Administered 2013-06-24: 1 g via INTRAVENOUS
  Filled 2013-06-24: qty 10

## 2013-06-24 MED ORDER — POTASSIUM CHLORIDE CRYS ER 20 MEQ PO TBCR
40.0000 meq | EXTENDED_RELEASE_TABLET | Freq: Once | ORAL | Status: AC
  Administered 2013-06-24: 40 meq via ORAL
  Filled 2013-06-24: qty 2

## 2013-06-24 MED ORDER — ONDANSETRON HCL 4 MG/2ML IJ SOLN
4.0000 mg | Freq: Once | INTRAMUSCULAR | Status: AC
  Administered 2013-06-24: 4 mg via INTRAVENOUS
  Filled 2013-06-24: qty 2

## 2013-06-24 NOTE — ED Provider Notes (Signed)
CSN: [id number]     Arrival date & time 06/24/13  1201 History   First MD Initiated Contact with Patient 06/24/13 1206     Chief Complaint  Patient presents with  . Eye Pain  . Abdominal Pain   (Consider location/radiation/quality/duration/timing/severity/associated sxs/prior Treatment) HPI  Agent presents to the emergency department with complaints of left eye swelling and pain that started on Tuesday as well as abdominal pain on her left side towards her back it started on Tuesday if well.  Tuesday- she states that she had a little pimple on the margin of her upper left eyelid. She is warm moist compresses and it opened up and fluid came out. This morning her eye lid was swollen and her eyelashes were matted shut. She denies change in vision or inability to move her eye. No pain with eye movement. No fevers or vomiting.  Abdominal pain- pt has been having dysuria and left flank pain since Tuesday. She has tried AZO over the counter but it has not helped much. She says the pain is a full sensation and is severe. She denies having any diarrhea, nausea, vomiting or fevers.  Past Medical History  Diagnosis Date  . Sickle cell trait    History reviewed. No pertinent past surgical history. History reviewed. No pertinent family history. History  Substance Use Topics  . Smoking status: Current Every Day Smoker  . Smokeless tobacco: Not on file  . Alcohol Use: No   OB History   Grav Para Term Preterm Abortions TAB SAB Ect Mult Living                 Review of Systems  All other systems reviewed and are negative.       Allergies  Review of patient's allergies indicates no known allergies.  Home Medications   Current Outpatient Rx  Name  Route  Sig  Dispense  Refill  . acetaminophen (TYLENOL) 500 MG tablet   Oral   Take 1,000 mg by mouth 3 (three) times daily as needed for pain.         . calcium carbonate (TUMS - DOSED IN MG ELEMENTAL CALCIUM) 500 MG chewable tablet  Oral   Chew 1 tablet by mouth daily.         . diclofenac sodium (VOLTAREN) 1 % GEL   Topical   Apply 2 g topically 4 (four) times daily as needed (for pain).         Marland Kitchen gabapentin (NEURONTIN) 300 MG capsule   Oral   Take 300 mg by mouth daily as needed (pain).          . meloxicam (MOBIC) 15 MG tablet   Oral   Take 15 mg by mouth daily as needed for pain.          . methocarbamol (ROBAXIN) 500 MG tablet   Oral   Take 500 mg by mouth 2 (two) times daily as needed (pain).          . phenazopyridine (PYRIDIUM) 95 MG tablet   Oral   Take 190 mg by mouth 3 (three) times daily as needed for pain.         Marland Kitchen Phenazopyridine HCl (AZO TABS PO)   Oral   Take 2 tablets by mouth as needed (pain).         . Rivaroxaban (XARELTO) 20 MG TABS tablet   Oral   Take 1 tablet (20 mg total) by mouth daily.   60 tablet  2   . traMADol (ULTRAM) 50 MG tablet   Oral   Take 50 mg by mouth every 6 (six) hours as needed for pain.         Marland Kitchen etonogestrel-ethinyl estradiol (NUVARING) 0.12-0.015 MG/24HR vaginal ring   Vaginal   Place 1 each vaginally every 28 (twenty-eight) days. Insert vaginally and leave in place for 3 consecutive weeks, then remove for 1 week.          BP 110/68  Pulse 72  Temp(Src) 98.3 F (36.8 C) (Oral)  Resp 18  SpO2 98%  LMP 06/10/2013 Physical Exam  Nursing note and vitals reviewed. Constitutional: She appears well-developed and well-nourished. No distress.  HENT:  Head: Normocephalic and atraumatic.  Eyes: Pupils are equal, round, and reactive to light.  Neck: Normal range of motion. Neck supple.  Cardiovascular: Normal rate and regular rhythm.   Pulmonary/Chest: Effort normal.  Abdominal: Soft. There is tenderness in the left upper quadrant and left lower quadrant. There is CVA tenderness.    Neurological: She is alert.  Skin: Skin is warm and dry.    ED Course  Procedures (including critical care time) Labs Review Labs Reviewed    URINALYSIS, ROUTINE W REFLEX MICROSCOPIC - Abnormal; Notable for the following:    Color, Urine ORANGE (*)    Hgb urine dipstick TRACE (*)    Nitrite POSITIVE (*)    Leukocytes, UA MODERATE (*)    All other components within normal limits  CBC WITH DIFFERENTIAL - Abnormal; Notable for the following:    MCHC 36.6 (*)    All other components within normal limits  BASIC METABOLIC PANEL - Abnormal; Notable for the following:    Potassium 3.1 (*)    All other components within normal limits  URINE MICROSCOPIC-ADD ON - Abnormal; Notable for the following:    Bacteria, UA FEW (*)    All other components within normal limits  URINE CULTURE  PREGNANCY, URINE   Imaging Review No results found.  MDM   1. Chalazion of left upper eyelid   2. Pyelonephritis    Potassium 3.1- replacement given in ED.  Urinalysis shows bacteria and hgb - symptoms and labs consistent with Pyelo. 1 g IV Rocephin and pain/nausea medications given in ED.  Rx: Cipro, Vicodin and Zofran Rx; Erythromycin ointment for eye infection  27 y.o.Joan Knight's evaluation in the Emergency Department is complete. It has been determined that no acute conditions requiring further emergency intervention are present at this time. The patient/guardian have been advised of the diagnosis and plan. We have discussed signs and symptoms that warrant return to the ED, such as changes or worsening in symptoms.  Vital signs are stable at discharge. Filed Vitals:   06/24/13 1215  BP: 110/68  Pulse: 72  Temp: 98.3 F (36.8 C)  Resp: 18    Patient/guardian has voiced understanding and agreed to follow-up with the PCP or specialist.     Dorthula Matas, PA-C 06/24/13 1428  Dorthula Matas, PA-C 06/24/13 1430

## 2013-06-24 NOTE — ED Notes (Signed)
Pt reports left eye swelling and pain, and also sts having pain in her side and abdomen that is moving to her back.

## 2013-06-24 NOTE — ED Notes (Signed)
Patient aware that urine specimen is needed. Unable to void at this time. 

## 2013-06-24 NOTE — ED Notes (Signed)
Pt c/o l eye pain since Tuesday. Reports pain started with a bump, then started draining last night.

## 2013-06-24 NOTE — ED Provider Notes (Signed)
Medical screening examination/treatment/procedure(s) were performed by non-physician practitioner and as supervising physician I was immediately available for consultation/collaboration.   Roshon Duell M Rodolfo Gaster, MD 06/24/13 1438 

## 2013-06-26 LAB — URINE CULTURE: Colony Count: >=100000

## 2013-06-27 NOTE — ED Notes (Signed)
+   Urine culture sensitive to same-chart appended per protocol MD.

## 2013-10-13 ENCOUNTER — Ambulatory Visit: Payer: BC Managed Care – PPO | Admitting: Internal Medicine

## 2013-10-21 ENCOUNTER — Encounter (HOSPITAL_COMMUNITY): Payer: Self-pay | Admitting: Emergency Medicine

## 2013-10-21 ENCOUNTER — Emergency Department (HOSPITAL_COMMUNITY)
Admission: EM | Admit: 2013-10-21 | Discharge: 2013-10-21 | Disposition: A | Discharge status: Home / Self Care | Payer: BC Managed Care – PPO | Attending: Emergency Medicine | Admitting: Emergency Medicine

## 2013-10-21 DIAGNOSIS — R1110 Vomiting, unspecified: Secondary | ICD-10-CM | POA: Insufficient documentation

## 2013-10-21 DIAGNOSIS — Z86718 Personal history of other venous thrombosis and embolism: Secondary | ICD-10-CM | POA: Insufficient documentation

## 2013-10-21 DIAGNOSIS — J111 Influenza due to unidentified influenza virus with other respiratory manifestations: Secondary | ICD-10-CM | POA: Insufficient documentation

## 2013-10-21 DIAGNOSIS — R5381 Other malaise: Secondary | ICD-10-CM | POA: Insufficient documentation

## 2013-10-21 DIAGNOSIS — R5383 Other fatigue: Secondary | ICD-10-CM

## 2013-10-21 DIAGNOSIS — R000 Tachycardia, unspecified: Secondary | ICD-10-CM | POA: Insufficient documentation

## 2013-10-21 DIAGNOSIS — Z862 Personal history of diseases of the blood and blood-forming organs and certain disorders involving the immune mechanism: Secondary | ICD-10-CM | POA: Insufficient documentation

## 2013-10-21 DIAGNOSIS — Z79899 Other long term (current) drug therapy: Secondary | ICD-10-CM | POA: Insufficient documentation

## 2013-10-21 DIAGNOSIS — Z7901 Long term (current) use of anticoagulants: Secondary | ICD-10-CM | POA: Insufficient documentation

## 2013-10-21 DIAGNOSIS — Z3202 Encounter for pregnancy test, result negative: Secondary | ICD-10-CM | POA: Insufficient documentation

## 2013-10-21 DIAGNOSIS — R42 Dizziness and giddiness: Secondary | ICD-10-CM | POA: Insufficient documentation

## 2013-10-21 DIAGNOSIS — F17200 Nicotine dependence, unspecified, uncomplicated: Secondary | ICD-10-CM | POA: Insufficient documentation

## 2013-10-21 HISTORY — DX: Acute embolism and thrombosis of unspecified deep veins of unspecified lower extremity: I82.409

## 2013-10-21 LAB — URINALYSIS, ROUTINE W REFLEX MICROSCOPIC
Bilirubin Urine: NEGATIVE
GLUCOSE, UA: NEGATIVE mg/dL
Hgb urine dipstick: NEGATIVE
KETONES UR: 15 mg/dL — AB
LEUKOCYTES UA: NEGATIVE
Nitrite: NEGATIVE
PH: 7 (ref 5.0–8.0)
Protein, ur: NEGATIVE mg/dL
Specific Gravity, Urine: 1.026 (ref 1.005–1.030)
Urobilinogen, UA: 1 mg/dL (ref 0.0–1.0)

## 2013-10-21 LAB — BASIC METABOLIC PANEL
BUN: 10 mg/dL (ref 6–23)
CHLORIDE: 100 meq/L (ref 96–112)
CO2: 26 meq/L (ref 19–32)
CREATININE: 0.77 mg/dL (ref 0.50–1.10)
Calcium: 9.5 mg/dL (ref 8.4–10.5)
GFR calc Af Amer: >90 mL/min (ref >90)
GFR calc non Af Amer: >90 mL/min (ref >90)
GLUCOSE: 85 mg/dL (ref 70–99)
Potassium: 3.7 mEq/L (ref 3.7–5.3)
Sodium: 140 mEq/L (ref 137–147)

## 2013-10-21 LAB — CBC WITH DIFFERENTIAL/PLATELET
BASOS ABS: 0 10*3/uL (ref 0.0–0.1)
Basophils Relative: 1 % (ref 0–1)
Eosinophils Absolute: 0 10*3/uL (ref 0.0–0.7)
Eosinophils Relative: 0 % (ref 0–5)
HEMATOCRIT: 35.3 % — AB (ref 36.0–46.0)
HEMOGLOBIN: 12.9 g/dL (ref 12.0–15.0)
LYMPHS ABS: 1.2 10*3/uL (ref 0.7–4.0)
LYMPHS PCT: 22 % (ref 12–46)
MCH: 30.3 pg (ref 26.0–34.0)
MCHC: 36.5 g/dL — ABNORMAL HIGH (ref 30.0–36.0)
MCV: 82.9 fL (ref 78.0–100.0)
MONO ABS: 0.8 10*3/uL (ref 0.1–1.0)
Monocytes Relative: 16 % — ABNORMAL HIGH (ref 3–12)
NEUTROS ABS: 3.3 10*3/uL (ref 1.7–7.7)
Neutrophils Relative %: 62 % (ref 43–77)
Platelets: 251 10*3/uL (ref 150–400)
RBC: 4.26 MIL/uL (ref 3.87–5.11)
RDW: 13.5 % (ref 11.5–15.5)
WBC: 5.3 10*3/uL (ref 4.0–10.5)

## 2013-10-21 LAB — URINE MICROSCOPIC-ADD ON

## 2013-10-21 LAB — PREGNANCY, URINE: PREG TEST UR: NEGATIVE

## 2013-10-21 LAB — HEPATIC FUNCTION PANEL
ALT: 13 U/L (ref 0–35)
AST: 19 U/L (ref 0–37)
Albumin: 4.3 g/dL (ref 3.5–5.2)
Alkaline Phosphatase: 58 U/L (ref 39–117)
Bilirubin, Direct: <0.2 mg/dL (ref 0.0–0.3)
Total Bilirubin: <0.2 mg/dL — ABNORMAL LOW (ref 0.3–1.2)
Total Protein: 7.6 g/dL (ref 6.0–8.3)

## 2013-10-21 LAB — LIPASE, BLOOD: Lipase: 19 U/L (ref 11–59)

## 2013-10-21 MED ORDER — SODIUM CHLORIDE 0.9 % IV BOLUS (SEPSIS)
1000.0000 mL | Freq: Once | INTRAVENOUS | Status: AC
  Administered 2013-10-21: 1000 mL via INTRAVENOUS

## 2013-10-21 MED ORDER — OSELTAMIVIR PHOSPHATE 75 MG PO CAPS: 75.0000 mg | ORAL_CAPSULE | Freq: Two times a day (BID) | ORAL | Status: DC

## 2013-10-21 MED ORDER — KETOROLAC TROMETHAMINE 30 MG/ML IJ SOLN
30.0000 mg | Freq: Once | INTRAMUSCULAR | Status: AC
  Administered 2013-10-21: 30 mg via INTRAVENOUS
  Filled 2013-10-21: qty 1

## 2013-10-21 MED ORDER — NAPROXEN 500 MG PO TABS: 500.0000 mg | ORAL_TABLET | Freq: Two times a day (BID) | ORAL | Status: DC

## 2013-10-21 MED ORDER — METOCLOPRAMIDE HCL 10 MG PO TABS: 10.0000 mg | ORAL_TABLET | Freq: Four times a day (QID) | ORAL | Status: DC | PRN

## 2013-10-21 MED ORDER — ONDANSETRON HCL 4 MG/2ML IJ SOLN
4.0000 mg | Freq: Once | INTRAMUSCULAR | Status: AC
  Administered 2013-10-21: 4 mg via INTRAVENOUS
  Filled 2013-10-21: qty 2

## 2013-10-21 NOTE — ED Notes (Signed)
Pt complains of weakness, fatigue, dizziness, and congestion.  Pt reports vomiting 2x last night.  Pt reports unable to hold down food or liquid. Pt reports body aches.

## 2013-10-21 NOTE — Discharge Instructions (Signed)
Take motrin, tylenol for fever and myalgias.   Take tamiflu as prescribed. STOP taking it if you are vomiting.   Stay hydrated.   Take reglan for nausea.   Follow up with your doctor.   Return to ER if you have dehydration, vomiting, fever for a week.

## 2013-10-21 NOTE — ED Provider Notes (Signed)
CSN: [id number]     Arrival date & time 10/21/13  1113 History   First MD Initiated Contact with Patient 10/21/13 1155     Chief Complaint  Patient presents with  . Influenza   (Consider location/radiation/quality/duration/timing/severity/associated sxs/prior Treatment) The history is provided by the patient.  Joan Knight is a 28 y.o. female hx of sickle cell trait, DVT on xarelto here with weakness, fatigue, dizziness, congestion. Symptoms for the last 2 days. Low grade temperature as well also some body aches. Subjective fever. Since yesterday, she has been vomiting and was unable to tolerate fluids. She didn't get flu shot this year and thought that she may have the flu. Denies worsening shortness of breath.    Past Medical History  Diagnosis Date  . Sickle cell trait   . DVT (deep venous thrombosis)    History reviewed. No pertinent past surgical history. No family history on file. History  Substance Use Topics  . Smoking status: Current Every Day Smoker -- 0.50 packs/day    Types: Cigarettes  . Smokeless tobacco: Not on file  . Alcohol Use: No   OB History   Grav Para Term Preterm Abortions TAB SAB Ect Mult Living                 Review of Systems  Gastrointestinal: Positive for vomiting.  Musculoskeletal: Positive for myalgias.  All other systems reviewed and are negative.    Allergies  Review of patient's allergies indicates no known allergies.  Home Medications   Current Outpatient Rx  Name  Route  Sig  Dispense  Refill  . acetaminophen (TYLENOL) 500 MG tablet   Oral   Take 1,000 mg by mouth 3 (three) times daily as needed for pain.         . calcium carbonate (TUMS - DOSED IN MG ELEMENTAL CALCIUM) 500 MG chewable tablet   Oral   Chew 1 tablet by mouth daily.         . ondansetron (ZOFRAN) 4 MG tablet   Oral   Take 1 tablet (4 mg total) by mouth every 6 (six) hours.   12 tablet   0   . Rivaroxaban (XARELTO) 20 MG TABS tablet   Oral   Take  1 tablet (20 mg total) by mouth daily.   60 tablet   2    BP 99/66  Pulse 105  Temp(Src) 99 F (37.2 C) (Oral)  Resp 18  Ht 5\' 2"  (1.575 m)  Wt 136 lb (61.689 kg)  BMI 24.87 kg/m2  SpO2 100%  LMP 10/06/2013 Physical Exam  Nursing note and vitals reviewed. Constitutional: She is oriented to person, place, and time.  Tired   HENT:  Head: Normocephalic.  MM slightly dry   Eyes: Conjunctivae are normal. Pupils are equal, round, and reactive to light.  Neck: Normal range of motion. Neck supple.  Cardiovascular: Regular rhythm and normal heart sounds.   Slightly tachy   Pulmonary/Chest: Effort normal and breath sounds normal. No respiratory distress. She has no wheezes. She has no rales.  Abdominal: Soft. Bowel sounds are normal. She exhibits no distension. There is no tenderness. There is no rebound and no guarding.  Musculoskeletal: Normal range of motion. She exhibits no edema and no tenderness.  Neurological: She is alert and oriented to person, place, and time.  Skin: Skin is warm and dry.  Psychiatric: She has a normal mood and affect. Her behavior is normal. Judgment and thought content normal.  ED Course  Procedures (including critical care time) Labs Review Labs Reviewed  CBC WITH DIFFERENTIAL - Abnormal; Notable for the following:    HCT 35.3 (*)    MCHC 36.5 (*)    Monocytes Relative 16 (*)    All other components within normal limits  URINALYSIS, ROUTINE W REFLEX MICROSCOPIC - Abnormal; Notable for the following:    APPearance TURBID (*)    Ketones, ur 15 (*)    All other components within normal limits  HEPATIC FUNCTION PANEL - Abnormal; Notable for the following:    Total Bilirubin <0.2 (*)    All other components within normal limits  URINE MICROSCOPIC-ADD ON - Abnormal; Notable for the following:    Squamous Epithelial / LPF MANY (*)    Bacteria, UA MANY (*)    All other components within normal limits  BASIC METABOLIC PANEL  PREGNANCY, URINE   LIPASE, BLOOD   Imaging Review No results found.  EKG Interpretation   None       MDM  No diagnosis found. Joan Knight is a 28 y.o. female here with weakness, fatigue. Likely viral vs flu syndrome. Slightly dehydrated. Will check labs and UA. Will hydrate and reassess.   2:50 PM Felt better after hydration and toradol. Tolerated crackers and PO fluids. Counseled her regarding tamiflu and she wants to get it. Will d/c home with reglan as well.    Wandra Arthurs, MD 10/21/13 [phone]

## 2014-01-01 ENCOUNTER — Emergency Department (HOSPITAL_COMMUNITY): Payer: BC Managed Care – PPO

## 2014-01-01 ENCOUNTER — Emergency Department (HOSPITAL_COMMUNITY)
Admission: EM | Admit: 2014-01-01 | Discharge: 2014-01-01 | Disposition: A | Discharge status: Home / Self Care | Payer: BC Managed Care – PPO | Attending: Emergency Medicine | Admitting: Emergency Medicine

## 2014-01-01 ENCOUNTER — Encounter (HOSPITAL_COMMUNITY): Payer: Self-pay | Admitting: Emergency Medicine

## 2014-01-01 DIAGNOSIS — Z86718 Personal history of other venous thrombosis and embolism: Secondary | ICD-10-CM | POA: Insufficient documentation

## 2014-01-01 DIAGNOSIS — Z862 Personal history of diseases of the blood and blood-forming organs and certain disorders involving the immune mechanism: Secondary | ICD-10-CM | POA: Insufficient documentation

## 2014-01-01 DIAGNOSIS — A499 Bacterial infection, unspecified: Secondary | ICD-10-CM | POA: Insufficient documentation

## 2014-01-01 DIAGNOSIS — R109 Unspecified abdominal pain: Secondary | ICD-10-CM

## 2014-01-01 DIAGNOSIS — B9689 Other specified bacterial agents as the cause of diseases classified elsewhere: Secondary | ICD-10-CM | POA: Insufficient documentation

## 2014-01-01 DIAGNOSIS — Z791 Long term (current) use of non-steroidal anti-inflammatories (NSAID): Secondary | ICD-10-CM | POA: Insufficient documentation

## 2014-01-01 DIAGNOSIS — Z7901 Long term (current) use of anticoagulants: Secondary | ICD-10-CM | POA: Insufficient documentation

## 2014-01-01 DIAGNOSIS — K5900 Constipation, unspecified: Secondary | ICD-10-CM | POA: Insufficient documentation

## 2014-01-01 DIAGNOSIS — Z79899 Other long term (current) drug therapy: Secondary | ICD-10-CM | POA: Insufficient documentation

## 2014-01-01 DIAGNOSIS — Z3202 Encounter for pregnancy test, result negative: Secondary | ICD-10-CM | POA: Insufficient documentation

## 2014-01-01 DIAGNOSIS — F17200 Nicotine dependence, unspecified, uncomplicated: Secondary | ICD-10-CM | POA: Insufficient documentation

## 2014-01-01 DIAGNOSIS — R112 Nausea with vomiting, unspecified: Secondary | ICD-10-CM | POA: Insufficient documentation

## 2014-01-01 DIAGNOSIS — N760 Acute vaginitis: Secondary | ICD-10-CM | POA: Insufficient documentation

## 2014-01-01 LAB — COMPREHENSIVE METABOLIC PANEL
ALBUMIN: 4.1 g/dL (ref 3.5–5.2)
ALT: 11 U/L (ref 0–35)
AST: 14 U/L (ref 0–37)
Alkaline Phosphatase: 49 U/L (ref 39–117)
BUN: 17 mg/dL (ref 6–23)
CHLORIDE: 103 meq/L (ref 96–112)
CO2: 27 mEq/L (ref 19–32)
CREATININE: 0.75 mg/dL (ref 0.50–1.10)
Calcium: 9.5 mg/dL (ref 8.4–10.5)
GFR calc Af Amer: >90 mL/min (ref >90)
GFR calc non Af Amer: >90 mL/min (ref >90)
Glucose, Bld: 89 mg/dL (ref 70–99)
POTASSIUM: 4.1 meq/L (ref 3.7–5.3)
Sodium: 140 mEq/L (ref 137–147)
Total Protein: 7.1 g/dL (ref 6.0–8.3)

## 2014-01-01 LAB — URINE MICROSCOPIC-ADD ON

## 2014-01-01 LAB — CBC WITH DIFFERENTIAL/PLATELET
BASOS ABS: 0 10*3/uL (ref 0.0–0.1)
BASOS PCT: 0 % (ref 0–1)
Eosinophils Absolute: 0 10*3/uL (ref 0.0–0.7)
Eosinophils Relative: 0 % (ref 0–5)
HCT: 34.8 % — ABNORMAL LOW (ref 36.0–46.0)
Hemoglobin: 12.7 g/dL (ref 12.0–15.0)
Lymphocytes Relative: 18 % (ref 12–46)
Lymphs Abs: 1.8 10*3/uL (ref 0.7–4.0)
MCH: 30.8 pg (ref 26.0–34.0)
MCHC: 36.5 g/dL — AB (ref 30.0–36.0)
MCV: 84.5 fL (ref 78.0–100.0)
Monocytes Absolute: 0.4 10*3/uL (ref 0.1–1.0)
Monocytes Relative: 4 % (ref 3–12)
NEUTROS ABS: 7.8 10*3/uL — AB (ref 1.7–7.7)
NEUTROS PCT: 78 % — AB (ref 43–77)
Platelets: 264 10*3/uL (ref 150–400)
RBC: 4.12 MIL/uL (ref 3.87–5.11)
RDW: 13.9 % (ref 11.5–15.5)
WBC: 10 10*3/uL (ref 4.0–10.5)

## 2014-01-01 LAB — URINALYSIS, ROUTINE W REFLEX MICROSCOPIC
BILIRUBIN URINE: NEGATIVE
Glucose, UA: NEGATIVE mg/dL
Hgb urine dipstick: NEGATIVE
Ketones, ur: NEGATIVE mg/dL
NITRITE: NEGATIVE
PH: 7 (ref 5.0–8.0)
Protein, ur: NEGATIVE mg/dL
SPECIFIC GRAVITY, URINE: 1.025 (ref 1.005–1.030)
Urobilinogen, UA: 0.2 mg/dL (ref 0.0–1.0)

## 2014-01-01 LAB — LIPASE, BLOOD: Lipase: 32 U/L (ref 11–59)

## 2014-01-01 LAB — WET PREP, GENITAL
TRICH WET PREP: NONE SEEN
YEAST WET PREP: NONE SEEN

## 2014-01-01 LAB — POC URINE PREG, ED: PREG TEST UR: NEGATIVE

## 2014-01-01 LAB — RPR: RPR Ser Ql: NONREACTIVE

## 2014-01-01 MED ORDER — ONDANSETRON HCL 4 MG/2ML IJ SOLN
4.0000 mg | Freq: Once | INTRAMUSCULAR | Status: AC
  Administered 2014-01-01: 4 mg via INTRAVENOUS
  Filled 2014-01-01: qty 2

## 2014-01-01 MED ORDER — SODIUM CHLORIDE 0.9 % IV BOLUS (SEPSIS)
1000.0000 mL | Freq: Once | INTRAVENOUS | Status: AC
  Administered 2014-01-01: 1000 mL via INTRAVENOUS

## 2014-01-01 MED ORDER — METRONIDAZOLE 500 MG PO TABS: 500.0000 mg | ORAL_TABLET | Freq: Two times a day (BID) | ORAL | Status: DC

## 2014-01-01 MED ORDER — MORPHINE SULFATE 4 MG/ML IJ SOLN
4.0000 mg | Freq: Once | INTRAMUSCULAR | Status: AC
  Administered 2014-01-01: 4 mg via INTRAVENOUS
  Filled 2014-01-01: qty 1

## 2014-01-01 MED ORDER — HYDROMORPHONE HCL PF 1 MG/ML IJ SOLN
1.0000 mg | Freq: Once | INTRAMUSCULAR | Status: AC
  Administered 2014-01-01: 1 mg via INTRAVENOUS
  Filled 2014-01-01: qty 1

## 2014-01-01 NOTE — Discharge Instructions (Signed)
Try Miralax for constipation - this is over the counter  Take flagyl for bacterial vaginosis - do not drink alcohol while taking this medication  Take over the counter Tylenol or Ibuprofen for pain  Return to the emergency department if you develop any changing/worsening condition, repeated vomiting, fever, blood in stool/vomit, feeling lightheaded, or any other concerns (please read additional information regarding your condition below)   Abdominal Pain, Adult Many things can cause abdominal pain. Usually, abdominal pain is not caused by a disease and will improve without treatment. It can often be observed and treated at home. Your health care provider will do a physical exam and possibly order blood tests and X-rays to help determine the seriousness of your pain. However, in many cases, more time must pass before a clear cause of the pain can be found. Before that point, your health care provider may not know if you need more testing or further treatment. HOME CARE INSTRUCTIONS  Monitor your abdominal pain for any changes. The following actions may help to alleviate any discomfort you are experiencing:  Only take over-the-counter or prescription medicines as directed by your health care provider.  Do not take laxatives unless directed to do so by your health care provider.  Try a clear liquid diet (broth, tea, or water) as directed by your health care provider. Slowly move to a bland diet as tolerated. SEEK MEDICAL CARE IF:  You have unexplained abdominal pain.  You have abdominal pain associated with nausea or diarrhea.  You have pain when you urinate or have a bowel movement.  You experience abdominal pain that wakes you in the night.  You have abdominal pain that is worsened or improved by eating food.  You have abdominal pain that is worsened with eating fatty foods. SEEK IMMEDIATE MEDICAL CARE IF:   Your pain does not go away within 2 hours.  You have a fever.  You keep  throwing up (vomiting).  Your pain is felt only in portions of the abdomen, such as the right side or the left lower portion of the abdomen.  You pass bloody or black tarry stools. MAKE SURE YOU:  Understand these instructions.   Will watch your condition.   Will get help right away if you are not doing well or get worse.  Document Released: 07/02/2005 Document Revised: 07/13/2013 Document Reviewed: 06/01/2013 [facility] Patient Information 2014 [place].  Nausea and Vomiting Nausea is a sick feeling that often comes before throwing up (vomiting). Vomiting is a reflex where stomach contents come out of your mouth. Vomiting can cause severe loss of body fluids (dehydration). Children and elderly adults can become dehydrated quickly, especially if they also have diarrhea. Nausea and vomiting are symptoms of a condition or disease. It is important to find the cause of your symptoms. CAUSES   Direct irritation of the stomach lining. This irritation can result from increased acid production (gastroesophageal reflux disease), infection, food poisoning, taking certain medicines (such as nonsteroidal anti-inflammatory drugs), alcohol use, or tobacco use.  Signals from the brain.These signals could be caused by a headache, heat exposure, an inner ear disturbance, increased pressure in the brain from injury, infection, a tumor, or a concussion, pain, emotional stimulus, or metabolic problems.  An obstruction in the gastrointestinal tract (bowel obstruction).  Illnesses such as diabetes, hepatitis, gallbladder problems, appendicitis, kidney problems, cancer, sepsis, atypical symptoms of a heart attack, or eating disorders.  Medical treatments such as chemotherapy and radiation.  Receiving medicine that makes you  sleep (general anesthetic) during surgery. DIAGNOSIS Your caregiver may ask for tests to be done if the problems do not improve after a few days. Tests may also be done if  symptoms are severe or if the reason for the nausea and vomiting is not clear. Tests may include:  Urine tests.  Blood tests.  Stool tests.  Cultures (to look for evidence of infection).  X-rays or other imaging studies. Test results can help your caregiver make decisions about treatment or the need for additional tests. TREATMENT You need to stay well hydrated. Drink frequently but in small amounts.You may wish to drink water, sports drinks, clear broth, or eat frozen ice pops or gelatin dessert to help stay hydrated.When you eat, eating slowly may help prevent nausea.There are also some antinausea medicines that may help prevent nausea. HOME CARE INSTRUCTIONS   Take all medicine as directed by your caregiver.  If you do not have an appetite, do not force yourself to eat. However, you must continue to drink fluids.  If you have an appetite, eat a normal diet unless your caregiver tells you differently.  Eat a variety of complex carbohydrates (rice, wheat, potatoes, bread), lean meats, yogurt, fruits, and vegetables.  Avoid high-fat foods because they are more difficult to digest.  Drink enough water and fluids to keep your urine clear or pale yellow.  If you are dehydrated, ask your caregiver for specific rehydration instructions. Signs of dehydration may include:  Severe thirst.  Dry lips and mouth.  Dizziness.  Dark urine.  Decreasing urine frequency and amount.  Confusion.  Rapid breathing or pulse. SEEK IMMEDIATE MEDICAL CARE IF:   You have blood or brown flecks (like coffee grounds) in your vomit.  You have black or bloody stools.  You have a severe headache or stiff neck.  You are confused.  You have severe abdominal pain.  You have chest pain or trouble breathing.  You do not urinate at least once every 8 hours.  You develop cold or clammy skin.  You continue to vomit for longer than 24 to 48 hours.  You have a fever. MAKE SURE YOU:    Understand these instructions.  Will watch your condition.  Will get help right away if you are not doing well or get worse. Document Released: 09/22/2005 Document Revised: 12/15/2011 Document Reviewed: 02/19/2011 [facility] Patient Information 2014 [place], [place].  Bacterial Vaginosis Bacterial vaginosis is a vaginal infection that occurs when the normal balance of bacteria in the vagina is disrupted. It results from an overgrowth of certain bacteria. This is the most common vaginal infection in women of childbearing age. Treatment is important to prevent complications, especially in pregnant women, as it can cause a premature delivery. CAUSES  Bacterial vaginosis is caused by an increase in harmful bacteria that are normally present in smaller amounts in the vagina. Several different kinds of bacteria can cause bacterial vaginosis. However, the reason that the condition develops is not fully understood. RISK FACTORS Certain activities or behaviors can put you at an increased risk of developing bacterial vaginosis, including:  Having a new sex partner or multiple sex partners.  Douching.  Using an intrauterine device (IUD) for contraception. Women do not get bacterial vaginosis from toilet seats, bedding, swimming pools, or contact with objects around them. SIGNS AND SYMPTOMS  Some women with bacterial vaginosis have no signs or symptoms. Common symptoms include:  Grey vaginal discharge.  A fishlike odor with discharge, especially after sexual intercourse.  Itching or burning  of the vagina and vulva.  Burning or pain with urination. DIAGNOSIS  Your health care provider will take a medical history and examine the vagina for signs of bacterial vaginosis. A sample of vaginal fluid may be taken. Your health care provider will look at this sample under a microscope to check for bacteria and abnormal cells. A vaginal pH test may also be done.  TREATMENT  Bacterial vaginosis may be  treated with antibiotic medicines. These may be given in the form of a pill or a vaginal cream. A second round of antibiotics may be prescribed if the condition comes back after treatment.  HOME CARE INSTRUCTIONS   Only take over-the-counter or prescription medicines as directed by your health care provider.  If antibiotic medicine was prescribed, take it as directed. Make sure you finish it even if you start to feel better.  Do not have sex until treatment is completed.  Tell all sexual partners that you have a vaginal infection. They should see their health care provider and be treated if they have problems, such as a mild rash or itching.  Practice safe sex by using condoms and only having one sex partner. SEEK MEDICAL CARE IF:   Your symptoms are not improving after 3 days of treatment.  You have increased discharge or pain.  You have a fever. MAKE SURE YOU:   Understand these instructions.  Will watch your condition.  Will get help right away if you are not doing well or get worse. FOR MORE INFORMATION  Centers for Disease Control and Prevention, Division of STD Prevention: SolutionApps.co.za American Sexual Health Association (ASHA): www.ashastd.org  Document Released: 09/22/2005 Document Revised: 07/13/2013 Document Reviewed: 05/04/2013 [facility] Patient Information 2014 [place], [place].   Emergency Department Resource Guide 1) Find a Doctor and Pay Out of Pocket Although you won't have to find out who is covered by your insurance plan, it is a good idea to ask around and get recommendations. You will then need to call the office and see if the doctor you have chosen will accept you as a new patient and what types of options they offer for patients who are self-pay. Some doctors offer discounts or will set up payment plans for their patients who do not have insurance, but you will need to ask so you aren't surprised when you get to your appointment.  2) Contact Your Local  Health Department Not all health departments have doctors that can see patients for sick visits, but many do, so it is worth a call to see if yours does. If you don't know where your local health department is, you can check in your phone book. The CDC also has a tool to help you locate your state's health department, and many state websites also have listings of all of their local health departments.  3) Find a Walk-in Clinic If your illness is not likely to be very severe or complicated, you may want to try a walk in clinic. These are popping up all over the country in pharmacies, drugstores, and shopping centers. They're usually staffed by nurse practitioners or physician assistants that have been trained to treat common illnesses and complaints. They're usually fairly quick and inexpensive. However, if you have serious medical issues or chronic medical problems, these are probably not your best option.  No Primary Care Doctor: - Call Health Connect at  [phone] - they can help you locate a primary care doctor that  accepts your insurance, provides certain services, etc. -  Physician Referral Service- [phone]  Chronic Pain Problems: Organization         Address  Phone   Notes  [facility]  [phone] Patients need to be referred by their primary care doctor.   Medication Assistance: Organization         Address  Phone   Notes  [facility] Medication [facility] [address] [place], [place] [address] [phone] --Must be a resident of [facility] -- Must have NO insurance coverage whatsoever (no Medicaid/ Medicare, etc.) -- The pt. MUST have a primary care doctor that directs their care regularly and follows them in the community   MedAssist  [phone]   [employer]  [phone]    Agencies that provide inexpensive medical care: Organization         Address  Phone   Notes  [place]  [phone]    [facility]    [phone]   [facility] [place], [place] [address] [phone]   [place] [address] [place] [phone]   Planned Parenthood    [phone]   [facility]    [phone]   [place] and [place] [address], [place] Phone:  [phone], Fax:  [phone] Hours of Operation:  9 am - 6 pm, M-F.  Also accepts Medicaid/Medicare and self-pay.  [facility] for [place], [address], [place] Phone: [phone], Fax: [phone]. Hours of Operation:  8:30 am - 5:30 pm, M-F.  Also accepts Medicaid and self-pay.  [facility] [place] [address] [place] Phone: [phone]   [place], [place], [place] [phone], Ext. [phone] Mondays & Thursdays: 7-9 AM.  First 15 patients are seen on a first come, first serve basis.    [place] Providers:  Organization         Address  Phone   Notes  [facility] [address], [place] [phone] Also accepts self-pay patients.  [facility] [address], [place]  [phone]   [place], [address], [place] [phone]   [facility] [address] [place] [phone]   Lucianne Lei [address], [place]   [phone] Only accepts [place] Access [place] patients after they have their name applied to their card.   Self-Pay (no insurance) in [facility]:  Organization         Address  Phone   Notes  Sickle Cell Patients, [facility] Internal Medicine [place] [phone]   [facility] Urgent Care [place] [phone]   [facility] [place]  [place], [place],  [place] [phone]   [facility]/Dr. Osei-Bonsu  [address], [place] or [address], [place] [phone] Phone number for both [place] and [place] locations is the same.  [facility]gent [facility] [address] [place] [phone]   [facility] [address] [place] or [address] [phone]   [facility] [place], [place] [phone],  phone; [phone], fax Sees patients 1st and 3rd Saturday of every month.  Must not qualify for public or private insurance (i.e. Medicaid, Medicare, [place] Health Choice, Veterans' Benefits)  Household income should be no more than 200% of the poverty level The clinic cannot treat you if you are pregnant or think you are pregnant  Sexually transmitted diseases are not treated at the clinic.    Dental Care: Organization         Address  Phone  Notes  [facility] Department of [facility] [place] [phone] Accepts children up to age 21 who are enrolled in [place] or [place]; pregnant women with a Medicaid card; and children who have applied for Medicaid or [place] Health Choice, but were declined, whose parents can pay a reduced fee at time of service.  [facility] Department of [facility]  [address], [place] [phone] Accepts children up to age 68 who are enrolled in [place] or [place]; pregnant women with a Medicaid card; and children who have applied for Medicaid or [place] Health Choice, but were declined, whose parents can pay a reduced fee at time of service.  [place] Adult Dental Access PROGRAM  [place] [phone] Patients are seen by appointment only. Walk-ins are not accepted. [place] will see patients 77 years of age and older. Monday - Tuesday (8am-5pm) Most Wednesdays  (8:30-5pm) $30 per visit, cash only  [facility] Adult Dental Access PROGRAM  [address], [facility] [phone] Patients are seen by appointment only. Walk-ins are not accepted. [place] will see patients 28 years of age and older. One Wednesday Evening (Monthly: Volunteer Based).  $30 per visit, cash only  [place]  [phone] for adults; Children under age 32, call Graduate Pediatric Dentistry at [phone]. Children aged 54-14, please call [phone] to request a pediatric application.  Dental services are provided in all areas of dental care including fillings, crowns and bridges, complete and partial dentures, implants, gum treatment, root canals, and extractions. Preventive care is also provided. Treatment is provided to both adults and children. Patients are selected via a lottery and there is often a waiting list.   [facility] [address] [place]  [phone] [email or web address]   [facility] [address] [place], [place] [phone], Ext. [phone] Second and Fourth Thursday of each month, opens at 6:30 AM; Clinic ends at 9 AM.  Patients are seen on a first-come first-served basis, and a limited number are seen during each clinic.   [facility]  [address] Hillard Danker [place], [place] [phone]   Eligibility Requirements You must have lived in [place], [place], or [place] counties for at least the last three months.   You cannot be eligible for state or federal sponsored [employer], including [employer], [place], or [employer].   You generally cannot be eligible for healthcare insurance through your employer.    How to apply: Eligibility screenings are held every Tuesday and Wednesday afternoon from 1:00 pm until 4:00 pm. You do not need an appointment for the interview!  [facility] [address] [place], [place]   [place]  [place] Department  Page  [phone]    Behavioral Health Resources in  the Community: Intensive Outpatient Programs Organization         Address  Phone  Notes  [place]. [address], [place], [place] [phone]   [facility] [address], [place], [place]   ADS: Alcohol [facility] Drug [facility] [address] [place], [place]   [place] [address]  [place], [place] or [phone]   Substance Abuse Resources Organization         Address  Phone  Notes  Alcohol and Drug Services  [phone]   [place]  [phone]   [facility] [place]   Chinita Pester  [phone]   Residential & Outpatient Substance Abuse Program  [phone]   Psychological Services Organization         Address  Phone  Notes  [facility] [place]  [place]  [phone]   [place] [address] [place] or [phone]    Mobile Crisis Teams Organization         Address  Phone  Notes  [facility], Mobile Crisis Care Unit  [phone]   [facility]  [address] [place], [place]   [place] [address], [place] [phone]    Self-Help/Support Groups Organization         Address  Phone             Notes  [place]. of [place] - variety of support groups  [place] Call for more information  Narcotics Anonymous (NA), [facility] [address], [employer] [place]  2 meetings at this location   Special educational needs teacher         Address  Phone  Notes  ASAP Residential Treatment [place],    [place]  [phone]   [facility]  [address] [place] 6[address]854, [place], [place]   [place], [place] [phone] Admissions: 8am-3pm M-F  Incentives Substance [place] [address],    [place], [place] [phone]   [facility] [address] [place], [place], [place]   [facility] [address]  [place], [place]   [facility] Programs - Intensive Outpatient [address] Kristeen [address], [place], [place]   [facility] (.) [place].,  [place], [place] [phone] or [phone]   [facility] Services (RTS) [address] [place], [place] Accepts Medicaid  [facility] [place] [address]  [place] [phone] Substance Abuse/Addiction Treatment   [facility] Organization         Address  Phone  Notes  [facility]  [phone]   Domenic Schwab, PhD [address] Arlis Porta [place], [place]   [phone] or [phone]   [place], [place] [phone]   [place] [address] [place], [place] [phone] Insurance/Medicaid/sponsorship through [employer] and Families [address] [place]                                    [place], [place] [phone] [place] [address], [place] [phone]  Dr. Adele Schilder  [phone]   [facility] Dept. 1) [address], [place] 2) [place] 3)  [place] [address], [place] [phone]  [phone]   [place] [phone] or [phone] (After Hours)

## 2014-01-01 NOTE — ED Notes (Signed)
Pt unable to void at this time. 

## 2014-01-01 NOTE — ED Provider Notes (Signed)
CSN: [id number]     Arrival date & time 01/01/14  1341 History   None    Chief Complaint  Patient presents with  . Abdominal Pain  . Nausea  . Emesis   HPI  Joan Knight is a 28 y.o. female with a PMH of sickle cell trait and DVT who presents to the ED for evaluation of abdominal pain, nausea, and emesis. History was provided by the patient. Patient states that she has gradually worsening constant abdominal pain in the LLQ with radiation up to the LUQ which started this morning. Pain is described as a sharp and cramping sensation. Pain also radiates to the lower back bilaterally. She tried taking Tylenol with no relief. Pain similar to when she had a UTI and STD. Associated symptoms include nausea and vomiting x 3. Patient also has had chronic constipation with a bowel movement today after giving herself a suppository. Patient denies any rectal bleeding/pain. No previous abdominal surgeries. Patient also has had a thick white vaginal discharge. No vaginal bleeding. LNMP 2 weeks ago. Patient currently sexually active. No new partners. No concerns for STD's but would like to be tested. No dysuria, hematuria. No means of contraception. No fever, chills, chest pain, SOB, cough, headache, dizziness, lightheadedness, leg edema. Patient currently on Xarelto for DVT.    Past Medical History  Diagnosis Date  . Sickle cell trait   . DVT (deep venous thrombosis)    History reviewed. No pertinent past surgical history. No family history on file. History  Substance Use Topics  . Smoking status: Current Every Day Smoker -- 0.50 packs/day    Types: Cigarettes  . Smokeless tobacco: Not on file  . Alcohol Use: No   OB History   Grav Para Term Preterm Abortions TAB SAB Ect Mult Living                 Review of Systems  Constitutional: Negative for fever, chills, diaphoresis, activity change, appetite change and fatigue.  HENT: Negative for congestion, rhinorrhea and sore throat.   Respiratory:  Negative for cough and shortness of breath.   Cardiovascular: Negative for chest pain and leg swelling.  Gastrointestinal: Positive for nausea, vomiting, abdominal pain and constipation. Negative for diarrhea, blood in stool, anal bleeding and rectal pain.  Genitourinary: Positive for vaginal discharge. Negative for dysuria, frequency, hematuria, decreased urine volume, vaginal bleeding, difficulty urinating, vaginal pain, menstrual problem and pelvic pain.  Musculoskeletal: Positive for back pain. Negative for myalgias and neck pain.  Skin: Negative for color change and rash.  Neurological: Negative for dizziness, syncope, weakness, light-headedness, numbness and headaches.    Allergies  Review of patient's allergies indicates no known allergies.  Home Medications   Current Outpatient Rx  Name  Route  Sig  Dispense  Refill  . acetaminophen (TYLENOL) 500 MG tablet   Oral   Take 1,000 mg by mouth 3 (three) times daily as needed for pain.         . calcium carbonate (TUMS - DOSED IN MG ELEMENTAL CALCIUM) 500 MG chewable tablet   Oral   Chew 1 tablet by mouth daily.         . metoCLOPramide (REGLAN) 10 MG tablet   Oral   Take 1 tablet (10 mg total) by mouth every 6 (six) hours as needed for nausea (nausea/headache).   15 tablet   0   . naproxen (NAPROSYN) 500 MG tablet   Oral   Take 1 tablet (500 mg total) by  mouth 2 (two) times daily.   30 tablet   0   . ondansetron (ZOFRAN) 4 MG tablet   Oral   Take 1 tablet (4 mg total) by mouth every 6 (six) hours.   12 tablet   0   . oseltamivir (TAMIFLU) 75 MG capsule   Oral   Take 1 capsule (75 mg total) by mouth every 12 (twelve) hours.   10 capsule   0   . Rivaroxaban (XARELTO) 20 MG TABS tablet   Oral   Take 1 tablet (20 mg total) by mouth daily.   60 tablet   2    BP 106/72  Pulse 70  Temp(Src) 98.1 F (36.7 C) (Oral)  Resp 16  Wt 130 lb (58.968 kg)  SpO2 100%  LMP 12/04/2013  Filed Vitals:   01/01/14 1358  01/01/14 1855  BP: 106/72 111/63  Pulse: 70 56  Temp: 98.1 F (36.7 C)   TempSrc: Oral   Resp: 16 16  Weight: 130 lb (58.968 kg)   SpO2: 100% 93%    Physical Exam  Nursing note and vitals reviewed. Constitutional: She is oriented to person, place, and time. She appears well-developed and well-nourished. No distress.  HENT:  Head: Normocephalic and atraumatic.  Right Ear: External ear normal.  Left Ear: External ear normal.  Nose: Nose normal.  Mouth/Throat: Oropharynx is clear and moist.  Eyes: Conjunctivae are normal. Right eye exhibits no discharge. Left eye exhibits no discharge.  Neck: Normal range of motion. Neck supple.  Cardiovascular: Normal rate, regular rhythm, normal heart sounds and intact distal pulses.  Exam reveals no gallop and no friction rub.   No murmur heard. Pulmonary/Chest: Effort normal and breath sounds normal. No respiratory distress. She has no wheezes. She has no rales. She exhibits no tenderness.  Abdominal: Soft. She exhibits no distension and no mass. There is no tenderness. There is no rebound and no guarding.  Musculoskeletal: Normal range of motion. She exhibits tenderness. She exhibits no edema.  Mild tenderness to the lower lumbar paraspinal muscles diffusely. No CVA tenderness. No flank tenderness.   Neurological: She is alert and oriented to person, place, and time.  Skin: Skin is warm and dry. She is not diaphoretic.    ED Course  Procedures (including critical care time) Labs Review Labs Reviewed  CBC WITH DIFFERENTIAL - Abnormal; Notable for the following:    HCT 34.8 (*)    MCHC 36.5 (*)    Neutrophils Relative % 78 (*)    Neutro Abs 7.8 (*)    All other components within normal limits  COMPREHENSIVE METABOLIC PANEL - Abnormal; Notable for the following:    Total Bilirubin <0.2 (*)    All other components within normal limits  LIPASE, BLOOD  URINALYSIS, ROUTINE W REFLEX MICROSCOPIC  POC URINE PREG, ED   Imaging Review No  results found.   EKG Interpretation None     Results for orders placed during the hospital encounter of 01/01/14  WET PREP, GENITAL      Result Value Ref Range   Yeast Wet Prep HPF POC NONE SEEN  NONE SEEN   Trich, Wet Prep NONE SEEN  NONE SEEN   Clue Cells Wet Prep HPF POC FEW (*) NONE SEEN   WBC, Wet Prep HPF POC FEW (*) NONE SEEN  CBC WITH DIFFERENTIAL      Result Value Ref Range   WBC 10.0  4.0 - 10.5 K/uL   RBC 4.12  3.87 - 5.11 MIL/uL  Hemoglobin 12.7  12.0 - 15.0 g/dL   HCT 34.8 (*) 36.0 - 46.0 %   MCV 84.5  78.0 - 100.0 fL   MCH 30.8  26.0 - 34.0 pg   MCHC 36.5 (*) 30.0 - 36.0 g/dL   RDW 13.9  11.5 - 15.5 %   Platelets 264  150 - 400 K/uL   Neutrophils Relative % 78 (*) 43 - 77 %   Neutro Abs 7.8 (*) 1.7 - 7.7 K/uL   Lymphocytes Relative 18  12 - 46 %   Lymphs Abs 1.8  0.7 - 4.0 K/uL   Monocytes Relative 4  3 - 12 %   Monocytes Absolute 0.4  0.1 - 1.0 K/uL   Eosinophils Relative 0  0 - 5 %   Eosinophils Absolute 0.0  0.0 - 0.7 K/uL   Basophils Relative 0  0 - 1 %   Basophils Absolute 0.0  0.0 - 0.1 K/uL  COMPREHENSIVE METABOLIC PANEL      Result Value Ref Range   Sodium 140  137 - 147 mEq/L   Potassium 4.1  3.7 - 5.3 mEq/L   Chloride 103  96 - 112 mEq/L   CO2 27  19 - 32 mEq/L   Glucose, Bld 89  70 - 99 mg/dL   BUN 17  6 - 23 mg/dL   Creatinine, Ser 0.75  0.50 - 1.10 mg/dL   Calcium 9.5  8.4 - 10.5 mg/dL   Total Protein 7.1  6.0 - 8.3 g/dL   Albumin 4.1  3.5 - 5.2 g/dL   AST 14  0 - 37 U/L   ALT 11  0 - 35 U/L   Alkaline Phosphatase 49  39 - 117 U/L   Total Bilirubin <0.2 (*) 0.3 - 1.2 mg/dL   GFR calc non Af Amer >90  >90 mL/min   GFR calc Af Amer >90  >90 mL/min  LIPASE, BLOOD      Result Value Ref Range   Lipase 32  11 - 59 U/L  URINALYSIS, ROUTINE W REFLEX MICROSCOPIC      Result Value Ref Range   Color, Urine YELLOW  YELLOW   APPearance TURBID (*) CLEAR   Specific Gravity, Urine 1.025  1.005 - 1.030   pH 7.0  5.0 - 8.0   Glucose, UA NEGATIVE   NEGATIVE mg/dL   Hgb urine dipstick NEGATIVE  NEGATIVE   Bilirubin Urine NEGATIVE  NEGATIVE   Ketones, ur NEGATIVE  NEGATIVE mg/dL   Protein, ur NEGATIVE  NEGATIVE mg/dL   Urobilinogen, UA 0.2  0.0 - 1.0 mg/dL   Nitrite NEGATIVE  NEGATIVE   Leukocytes, UA SMALL (*) NEGATIVE  URINE MICROSCOPIC-ADD ON      Result Value Ref Range   Squamous Epithelial / LPF MANY (*) RARE   WBC, UA 7-10  <3 WBC/hpf   RBC / HPF 0-2  <3 RBC/hpf   Bacteria, UA RARE  RARE   Urine-Other AMORPHOUS URATES/PHOSPHATES    POC URINE PREG, ED      Result Value Ref Range   Preg Test, Ur NEGATIVE  NEGATIVE    MDM   Joan Knight is a 28 y.o. female with a PMH of sickle cell trait and DVT who presents to the ED for evaluation of abdominal pain, nausea, and emesis.   Rechecks  4:00 PM = Patient talking on cell phone in no distress. Pelvic exam at bedside with ED tech present. Mild-moderate thin and thick white discharge present in the  vaginal vault. No CMT. Left adnexal tenderness. No right adnexal tenderness. No vaginal bleeding.   4:30 PM = Patient requesting more pain medication. States morphine helped take the edge off but has pain with movement and sitting up. No pain while laying flat. Pain moving in various locations throughout her abdomen. Ordering 1 mg dilaudid.  6:40 PM = Patient states "I am hungry" and "I want to go home" and "I wasted my only day off being here all day." Asking to be discharged. Reviewed results with patient. States "I may be getting my period" soon.      Etiology of abdominal pain, nausea and vomiting possibly due to gastroenteritis vs constipation vs follicular cyst (Mittelscmerz). Abdominal exam benign. UA not suggestive of a UTI. Urine sent for culture. Labs unremarkable. US done due to left adnexal tenderness, which showed a left follicular cyst. 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 No torsion/masses. Pelvic exam otherwise unremarkable. Doubt PID. STD panel pending. Patient  had few clue cells on wet mount. Will treat for BV as patient is symptomatic. Labs unremarkable. Vital signs stable. Patient afebrile and non-toxic in appearance. Patient requesting discharge and will follow-up with PCP or return if symptoms worsen or do not improve. Return precautions, discharge instructions, and follow-up was discussed with the patient before discharge.     Discharge Medication List as of 01/01/2014  6:49 PM    START taking these medications   Details  metroNIDAZOLE (FLAGYL) 500 MG tablet Take 1 tablet (500 mg total) by mouth 2 (two) times daily., Starting 01/01/2014, Until Discontinued, Print         Final impressions: 1. Nausea & vomiting   2. Abdominal pain   3. Bacterial vaginosis      Harold Hedge Amyrie Illingworth PA-C          [place], [place] 01/02/14 1421

## 2014-01-01 NOTE — ED Notes (Signed)
Jessica, PA at bedside

## 2014-01-01 NOTE — ED Notes (Signed)
U/s at bedside

## 2014-01-01 NOTE — ED Notes (Signed)
Pt from home c/o  Left sided abdominal, nausea and vomiting that began this a.m.

## 2014-01-02 LAB — GC/CHLAMYDIA PROBE AMP
CT PROBE, AMP APTIMA: NEGATIVE
GC PROBE AMP APTIMA: NEGATIVE

## 2014-01-02 LAB — URINE CULTURE

## 2014-01-02 LAB — HIV ANTIBODY (ROUTINE TESTING W REFLEX): HIV: NONREACTIVE

## 2014-01-03 NOTE — ED Provider Notes (Signed)
Medical screening examination/treatment/procedure(s) were performed by non-physician practitioner and as supervising physician I was immediately available for consultation/collaboration.    Kathalene Frames, MD 01/03/14 [phone]

## 2014-03-07 ENCOUNTER — Ambulatory Visit: Payer: BC Managed Care – PPO | Attending: Internal Medicine | Admitting: Internal Medicine

## 2014-03-07 ENCOUNTER — Other Ambulatory Visit (HOSPITAL_COMMUNITY)
Admission: RE | Admit: 2014-03-07 | Discharge: 2014-03-07 | Disposition: A | Discharge status: Home / Self Care | Payer: BC Managed Care – PPO | Source: Ambulatory Visit | Attending: Internal Medicine | Admitting: Internal Medicine

## 2014-03-07 ENCOUNTER — Encounter: Payer: Self-pay | Admitting: Internal Medicine

## 2014-03-07 VITALS — BP 107/71 | HR 85 | Temp 98.1°F | Resp 15 | Ht 62.0 in | Wt 140.0 lb

## 2014-03-07 DIAGNOSIS — N760 Acute vaginitis: Secondary | ICD-10-CM | POA: Insufficient documentation

## 2014-03-07 DIAGNOSIS — Z86718 Personal history of other venous thrombosis and embolism: Secondary | ICD-10-CM | POA: Insufficient documentation

## 2014-03-07 DIAGNOSIS — Z113 Encounter for screening for infections with a predominantly sexual mode of transmission: Secondary | ICD-10-CM | POA: Insufficient documentation

## 2014-03-07 DIAGNOSIS — F17200 Nicotine dependence, unspecified, uncomplicated: Secondary | ICD-10-CM | POA: Insufficient documentation

## 2014-03-07 DIAGNOSIS — Z01419 Encounter for gynecological examination (general) (routine) without abnormal findings: Secondary | ICD-10-CM | POA: Insufficient documentation

## 2014-03-07 DIAGNOSIS — M7989 Other specified soft tissue disorders: Secondary | ICD-10-CM

## 2014-03-07 DIAGNOSIS — K5900 Constipation, unspecified: Secondary | ICD-10-CM | POA: Insufficient documentation

## 2014-03-07 DIAGNOSIS — L2089 Other atopic dermatitis: Secondary | ICD-10-CM

## 2014-03-07 DIAGNOSIS — K219 Gastro-esophageal reflux disease without esophagitis: Secondary | ICD-10-CM | POA: Insufficient documentation

## 2014-03-07 DIAGNOSIS — L293 Anogenital pruritus, unspecified: Secondary | ICD-10-CM | POA: Insufficient documentation

## 2014-03-07 DIAGNOSIS — L209 Atopic dermatitis, unspecified: Secondary | ICD-10-CM

## 2014-03-07 DIAGNOSIS — Z0000 Encounter for general adult medical examination without abnormal findings: Secondary | ICD-10-CM

## 2014-03-07 DIAGNOSIS — N898 Other specified noninflammatory disorders of vagina: Secondary | ICD-10-CM | POA: Insufficient documentation

## 2014-03-07 DIAGNOSIS — Z3009 Encounter for other general counseling and advice on contraception: Secondary | ICD-10-CM

## 2014-03-07 LAB — POCT URINE PREGNANCY: Preg Test, Ur: NEGATIVE

## 2014-03-07 LAB — CBC
HCT: 38.4 % (ref 36.0–46.0)
Hemoglobin: 13.1 g/dL (ref 12.0–15.0)
MCH: 30.1 pg (ref 26.0–34.0)
MCHC: 34.1 g/dL (ref 30.0–36.0)
MCV: 88.3 fL (ref 78.0–100.0)
Platelets: 298 10*3/uL (ref 150–400)
RBC: 4.35 MIL/uL (ref 3.87–5.11)
RDW: 14.1 % (ref 11.5–15.5)
WBC: 8.8 10*3/uL (ref 4.0–10.5)

## 2014-03-07 MED ORDER — POLYETHYLENE GLYCOL 3350 17 GM/SCOOP PO POWD: 17.0000 g | Freq: Every day | ORAL | Status: DC

## 2014-03-07 MED ORDER — TRIAMCINOLONE ACETONIDE 0.1 % EX CREA: 1.0000 "application " | TOPICAL_CREAM | Freq: Two times a day (BID) | CUTANEOUS | Status: DC

## 2014-03-07 MED ORDER — OMEPRAZOLE 20 MG PO CPDR: 20.0000 mg | DELAYED_RELEASE_CAPSULE | Freq: Every day | ORAL | Status: DC

## 2014-03-07 NOTE — Patient Instructions (Signed)
Smoking Cessation Quitting smoking is important to your health and has many advantages. However, it is not always easy to quit since nicotine is a very addictive drug. Often times, people try 3 times or more before being able to quit. This document explains the best ways for you to prepare to quit smoking. Quitting takes hard work and a lot of effort, but you can do it. ADVANTAGES OF QUITTING SMOKING  You will live longer, feel better, and live better.  Your body will feel the impact of quitting smoking almost immediately.  Within 20 minutes, blood pressure decreases. Your pulse returns to its normal level.  After 8 hours, carbon monoxide levels in the blood return to normal. Your oxygen level increases.  After 24 hours, the chance of having a heart attack starts to decrease. Your breath, hair, and body stop smelling like smoke.  After 48 hours, damaged nerve endings begin to recover. Your sense of taste and smell improve.  After 72 hours, the body is virtually free of nicotine. Your bronchial tubes relax and breathing becomes easier.  After 2 to 12 weeks, lungs can hold more air. Exercise becomes easier and circulation improves.  The risk of having a heart attack, stroke, cancer, or lung disease is greatly reduced.  After 1 year, the risk of coronary heart disease is cut in half.  After 5 years, the risk of stroke falls to the same as a nonsmoker.  After 10 years, the risk of lung cancer is cut in half and the risk of other cancers decreases significantly.  After 15 years, the risk of coronary heart disease drops, usually to the level of a nonsmoker.  If you are pregnant, quitting smoking will improve your chances of having a healthy baby.  The people you live with, especially any children, will be healthier.  You will have extra money to spend on things other than cigarettes. QUESTIONS TO THINK ABOUT BEFORE ATTEMPTING TO QUIT You may want to talk about your answers with your  caregiver.  Why do you want to quit?  If you tried to quit in the past, what helped and what did not?  What will be the most difficult situations for you after you quit? How will you plan to handle them?  Who can help you through the tough times? Your family? Friends? A caregiver?  What pleasures do you get from smoking? What ways can you still get pleasure if you quit? Here are some questions to ask your caregiver:  How can you help me to be successful at quitting?  What medicine do you think would be best for me and how should I take it?  What should I do if I need more help?  What is smoking withdrawal like? How can I get information on withdrawal? GET READY  Set a quit date.  Change your environment by getting rid of all cigarettes, ashtrays, matches, and lighters in your home, car, or work. Do not let people smoke in your home.  Review your past attempts to quit. Think about what worked and what did not. GET SUPPORT AND ENCOURAGEMENT You have a better chance of being successful if you have help. You can get support in many ways.  Tell your family, friends, and co-workers that you are going to quit and need their support. Ask them not to smoke around you.  Get individual, group, or telephone counseling and support. Programs are available at local hospitals and health centers. Call your local health department for   information about programs in your area.  Spiritual beliefs and practices may help some smokers quit.  Download a "quit meter" on your computer to keep track of quit statistics, such as how long you have gone without smoking, cigarettes not smoked, and money saved.  Get a self-help book about quitting smoking and staying off of tobacco. [place] yourself from urges to smoke. Talk to someone, go for a walk, or occupy your time with a task.  Change your normal routine. Take a different route to work. Drink tea instead of coffee.  Eat breakfast in a different place.  Reduce your stress. Take a hot bath, exercise, or read a book.  Plan something enjoyable to do every day. Reward yourself for not smoking.  Explore interactive web-based programs that specialize in helping you quit. GET MEDICINE AND USE IT CORRECTLY Medicines can help you stop smoking and decrease the urge to smoke. Combining medicine with the above behavioral methods and support can greatly increase your chances of successfully quitting smoking.  Nicotine replacement therapy helps deliver nicotine to your body without the negative effects and risks of smoking. Nicotine replacement therapy includes nicotine gum, lozenges, inhalers, nasal sprays, and skin patches. Some may be available over-the-counter and others require a prescription.  Antidepressant medicine helps people abstain from smoking, but how this works is unknown. This medicine is available by prescription.  Nicotinic receptor partial agonist medicine simulates the effect of nicotine in your brain. This medicine is available by prescription. Ask your caregiver for advice about which medicines to use and how to use them based on your health history. Your caregiver will tell you what side effects to look out for if you choose to be on a medicine or therapy. Carefully read the information on the package. Do not use any other product containing nicotine while using a nicotine replacement product.  RELAPSE OR DIFFICULT SITUATIONS Most relapses occur within the first 3 months after quitting. Do not be discouraged if you start smoking again. Remember, most people try several times before finally quitting. You may have symptoms of withdrawal because your body is used to nicotine. You may crave cigarettes, be irritable, feel very hungry, cough often, get headaches, or have difficulty concentrating. The withdrawal symptoms are only temporary. They are strongest when you first quit, but they will go away within  10 14 days. To reduce the chances of relapse, try to:  Avoid drinking alcohol. Drinking lowers your chances of successfully quitting.  Reduce the amount of caffeine you consume. Once you quit smoking, the amount of caffeine in your body increases and can give you symptoms, such as a rapid heartbeat, sweating, and anxiety.  Avoid smokers because they can make you want to smoke.  Do not let weight gain distract you. Many smokers will gain weight when they quit, usually less than 10 pounds. Eat a healthy diet and stay active. You can always lose the weight gained after you quit.  Find ways to improve your mood other than smoking. FOR MORE INFORMATION  www.smokefree.gov  Document Released: 09/16/2001 Document Revised: 03/23/2012 Document Reviewed: 01/01/2012 [facility] Patient Information 2014 [place], [place]. Constipation, Adult Constipation is when a person has fewer than 3 bowel movements a week; has difficulty having a bowel movement; or has stools that are dry, hard, or larger than normal. As people grow older, constipation is more common. If you try to fix constipation with medicines that make you have a bowel movement (laxatives), the problem  may get worse. Long-term laxative use may cause the muscles of the colon to become weak. A low-fiber diet, not taking in enough fluids, and taking certain medicines may make constipation worse. CAUSES   Certain medicines, such as antidepressants, pain medicine, iron supplements, antacids, and water pills.   Certain diseases, such as diabetes, irritable bowel syndrome (IBS), thyroid disease, or depression.   Not drinking enough water.   Not eating enough fiber-rich foods.   Stress or travel.  Lack of physical activity or exercise.  Not going to the restroom when there is the urge to have a bowel movement.  Ignoring the urge to have a bowel movement.  Using laxatives too much. SYMPTOMS   Having fewer than 3 bowel movements a week.    Straining to have a bowel movement.   Having hard, dry, or larger than normal stools.   Feeling full or bloated.   Pain in the lower abdomen.  Not feeling relief after having a bowel movement. DIAGNOSIS  Your caregiver will take a medical history and perform a physical exam. Further testing may be done for severe constipation. Some tests may include:   A barium enema X-ray to examine your rectum, colon, and sometimes, your small intestine.  A sigmoidoscopy to examine your lower colon.  A colonoscopy to examine your entire colon. TREATMENT  Treatment will depend on the severity of your constipation and what is causing it. Some dietary treatments include drinking more fluids and eating more fiber-rich foods. Lifestyle treatments may include regular exercise. If these diet and lifestyle recommendations do not help, your caregiver may recommend taking over-the-counter laxative medicines to help you have bowel movements. Prescription medicines may be prescribed if over-the-counter medicines do not work.  HOME CARE INSTRUCTIONS   Increase dietary fiber in your diet, such as fruits, vegetables, whole grains, and beans. Limit high-fat and processed sugars in your diet, such as [place] fries, hamburgers, cookies, candies, and soda.   A fiber supplement may be added to your diet if you cannot get enough fiber from foods.   Drink enough fluids to keep your urine clear or pale yellow.   Exercise regularly or as directed by your caregiver.   Go to the restroom when you have the urge to go. Do not hold it.  Only take medicines as directed by your caregiver. Do not take other medicines for constipation without talking to your caregiver first. [place] IF:   You have bright red blood in your stool.   Your constipation lasts for more than 4 days or gets worse.   You have abdominal or rectal pain.   You have thin, pencil-like stools.  You have unexplained  weight loss. MAKE SURE YOU:   Understand these instructions.  Will watch your condition.  Will get help right away if you are not doing well or get worse. Document Released: 06/20/2004 Document Revised: 12/15/2011 Document Reviewed: 07/04/2013 [facility] Patient Information 2014 [place], [place].

## 2014-03-07 NOTE — Progress Notes (Signed)
Pt is here today for a physical and a pap smear. Pt reports that she has a rash under her breast.

## 2014-03-07 NOTE — Progress Notes (Signed)
Patient ID: Joan Knight, female   DOB: [birth year], 28 y.o.   MRN: [id number]  CC: pap smear, rash  [name] reports that she was diagnosed with eczema over 10 years ago. She now has a rash under left breast for 1 month that is very pruritic.  Randomly see flare up on back of neck, under breast, and bikini line. She also c/o of constipation for 4 years.  Reports that she uses daily suppository 3 times per day to assist with bowel movements. She denies drinking water.  Feels bloated and has generalized abdominal pain.  Trouble passing gas.  Constantly feeling nauseous.  Has heartburns at night and after dinner, with vomiting.    Today patient presents for pap smear.  She reports that she has had unprotected sex recently and would like to be tested for STD's.  She reports a thick white vaginal discharge for one week. She reports vaginal itching. Denies odor, lesions, or dysuria.  Was on Xarelto for DVT in right leg back in August. Continually smoking. Was on Nuvaring, stopped last year when found DVT.  Patient reports pain in right claf while walking for past month and swelling. Patient denies SOB or chest pain.   No Known Allergies Past Medical History  Diagnosis Date  . Sickle cell trait   . DVT (deep venous thrombosis)    Current Outpatient Prescriptions on File Prior to Visit  Medication Sig Dispense Refill  . acetaminophen (TYLENOL) 500 MG tablet Take 1,000 mg by mouth 3 (three) times daily as needed for pain.      . metroNIDAZOLE (FLAGYL) 500 MG tablet Take 1 tablet (500 mg total) by mouth 2 (two) times daily.  14 tablet  0   No current facility-administered medications on file prior to visit.   History reviewed. No pertinent family history. History   Social History  . Marital Status: Single    Spouse Name: N/A    Number of Children: N/A  . Years of Education: N/A   Occupational History  . Not on file.   Social History Main Topics  . Smoking status: Current Every Day  Smoker -- 0.50 packs/day    Types: Cigarettes  . Smokeless tobacco: Not on file  . Alcohol Use: No  . Drug Use: No  . Sexual Activity: Not on file   Other Topics Concern  . Not on file   Social History Narrative  . No narrative on file   Review of Systems  Constitutional: Negative.   HENT: Negative.   Respiratory: Negative.   Cardiovascular: Negative for palpitations, claudication and leg swelling.  Gastrointestinal: Positive for heartburn, nausea, vomiting and constipation.  Genitourinary: Negative.   Musculoskeletal: Negative.   Skin: Positive for itching and rash.  Neurological: Negative for dizziness.     Objective:   Filed Vitals:   03/07/14 1417  BP: 107/71  Pulse: 85  Temp: 98.1 F (36.7 C)  Resp: 15    Physical Exam: Constitutional: Patient appears well-developed and well-nourished. No distress. HENT: Normocephalic, atraumatic, External right and left ear normal. Oropharynx is clear and moist.  Eyes: Conjunctivae and EOM are normal. PERRLA, no scleral icterus. Neck: Normal ROM. Neck supple. No JVD. No tracheal deviation. No thyromegaly. CVS: RRR, S1/S2 +, no murmurs, no gallops, no carotid bruit.  Pulmonary: Effort and breath sounds normal, no stridor, rhonchi, wheezes, rales.  Abdominal: Soft. BS +,  no distension, tenderness, rebound or guarding.  Musculoskeletal: Normal range of motion. No edema and no tenderness.  Lymphadenopathy:  No lymphadenopathy noted, cervical, inguinal or axillary Neuro: Alert. Normal reflexes, muscle tone coordination. No cranial nerve deficit. Skin: Skin is warm and dry. No rash noted. Not diaphoretic. No erythema. No pallor. Psychiatric: Normal mood and affect. Behavior, judgment, thought content normal.  Physical Exam  Pulmonary/Chest: Right breast exhibits no mass, no nipple discharge, no skin change and no tenderness. Left breast exhibits no mass, no nipple discharge, no skin change and no tenderness.  Genitourinary: Vagina  normal and uterus normal. Cervix exhibits no motion tenderness, no discharge and no friability. Right adnexum displays no mass and no tenderness. Left adnexum displays no mass and no tenderness. No vaginal discharge found.    Lab Results  Component Value Date   WBC 10.0 01/01/2014   HGB 12.7 01/01/2014   HCT 34.8* 01/01/2014   MCV 84.5 01/01/2014   PLT 264 01/01/2014   Lab Results  Component Value Date   CREATININE 0.75 01/01/2014   BUN 17 01/01/2014   NA 140 01/01/2014   K 4.1 01/01/2014   CL 103 01/01/2014   CO2 27 01/01/2014    No results found for this basename: HGBA1C   Lipid Panel  No results found for this basename: chol, trig, hdl, cholhdl, vldl, ldlcalc       Assessment and plan:   Joan Knight was seen today for follow-up.  Diagnoses and associated orders for this visit:  Swelling of limb - Lower Extremity Venous Duplex Right; Future Sending patient for venous duplex of right lower extremity. May need to start patient back on Xarelto. Unspecified constipation - polyethylene glycol powder (GLYCOLAX/MIRALAX) powder; Take 17 g by mouth daily. Will try miralax daily with increased water and fiber intake. WIll try bentyl on next exam if patient has continued constipation.  Acid reflux - omeprazole (PRILOSEC) 20 MG capsule; Take 1 capsule (20 mg total) by mouth daily. 30 minutes before meal Annual physical exam - POCT urine pregnancy - Cytology - PAP [place] - Cervicovaginal ancillary only - HIV antibody - RPR - CBC - COMPLETE METABOLIC PANEL WITH GFR - TSH - Hemoglobin A1C  Counseling for initiation of birth control method - Ambulatory referral to Gynecology Spent 10 minutes counseling on different methods of birth control  Atopic dermatitis - triamcinolone cream (KENALOG) 0.1 %; Apply 1 application topically 2 (two) times daily. Do not apply to face   Return if symptoms worsen or fail to improve.       Lance Bosch, [facility] [phone] 03/07/2014, 2:24 PM

## 2014-03-08 LAB — COMPLETE METABOLIC PANEL WITH GFR
ALT: 8 U/L (ref 0–35)
AST: 13 U/L (ref 0–37)
Albumin: 4.2 g/dL (ref 3.5–5.2)
Alkaline Phosphatase: 45 U/L (ref 39–117)
BUN: 12 mg/dL (ref 6–23)
CO2: 27 mEq/L (ref 19–32)
Calcium: 9.2 mg/dL (ref 8.4–10.5)
Chloride: 105 mEq/L (ref 96–112)
Creat: 0.79 mg/dL (ref 0.50–1.10)
GFR, Est African American: >89 mL/min
GLUCOSE: 54 mg/dL — AB (ref 70–99)
POTASSIUM: 3.7 meq/L (ref 3.5–5.3)
Sodium: 141 mEq/L (ref 135–145)
TOTAL PROTEIN: 6.5 g/dL (ref 6.0–8.3)
Total Bilirubin: 0.2 mg/dL (ref 0.2–1.2)

## 2014-03-08 LAB — RPR

## 2014-03-08 LAB — TSH: TSH: 0.418 u[IU]/mL (ref 0.350–4.500)

## 2014-03-08 LAB — HEMOGLOBIN A1C
Hgb A1c MFr Bld: 5.2 % (ref <5.7)
Mean Plasma Glucose: 103 mg/dL (ref <117)

## 2014-03-08 LAB — HIV ANTIBODY (ROUTINE TESTING W REFLEX): HIV 1&2 Ab, 4th Generation: NONREACTIVE

## 2014-03-10 ENCOUNTER — Telehealth: Payer: Self-pay

## 2014-03-10 LAB — CYTOLOGY - PAP

## 2014-03-10 MED ORDER — FLUCONAZOLE 150 MG PO TABS: 150.0000 mg | ORAL_TABLET | Freq: Once | ORAL | Status: DC

## 2014-03-10 MED ORDER — METRONIDAZOLE 500 MG PO TABS: 500.0000 mg | ORAL_TABLET | Freq: Two times a day (BID) | ORAL | Status: DC

## 2014-03-10 NOTE — Telephone Encounter (Signed)
Message copied by Theodis Shove on Fri Mar 10, 2014 10:08 AM ------      Message from: Chari Manning A      Created: Thu Mar 09, 2014  2:01 PM       Let patient know all STD's are negative and labs are normal. She came back positive for yeast and BV. May send metronidazole 500 mg BID for 7 days and send a one time dose of diflucan of 150 mg. Thanks. No alcohol on this medication. ------

## 2014-03-10 NOTE — Telephone Encounter (Signed)
Patient notified of lab results.  Script for metronidazole and diflucan sent to pharmacy. Patient verbalized understanding.

## 2014-03-14 ENCOUNTER — Telehealth: Payer: Self-pay | Admitting: *Deleted

## 2014-03-14 ENCOUNTER — Ambulatory Visit (HOSPITAL_COMMUNITY): Payer: BC Managed Care – PPO | Attending: Internal Medicine

## 2014-03-14 NOTE — Telephone Encounter (Signed)
Message copied by Velora Heckler on Tue Mar 14, 2014 12:13 PM ------      Message from: Chari Manning A      Created: Sun Mar 12, 2014  6:40 PM       Pap is normal will not need repeat pap for 3 years. Thanks. Make sure she is still getting the doppler studies of her lower extremity. Thanks ------

## 2014-03-14 NOTE — Telephone Encounter (Signed)
Patient notified of pap results and instructions.  States she had to reschedule the doppler study due to work schedule but that she does plan on getting it done.

## 2014-03-28 ENCOUNTER — Telehealth: Payer: Self-pay | Admitting: Internal Medicine

## 2014-03-28 NOTE — Telephone Encounter (Signed)
Pt has called in today to request a medication refill for pain medication for her leg; please f/u with pt at your earliest

## 2014-03-30 ENCOUNTER — Ambulatory Visit (INDEPENDENT_AMBULATORY_CARE_PROVIDER_SITE_OTHER): Payer: BC Managed Care – PPO | Admitting: Family Medicine

## 2014-03-30 VITALS — BP 116/73 | HR 78 | Temp 98.1°F

## 2014-03-30 DIAGNOSIS — Z3046 Encounter for surveillance of implantable subdermal contraceptive: Secondary | ICD-10-CM

## 2014-03-30 DIAGNOSIS — Z30017 Encounter for initial prescription of implantable subdermal contraceptive: Secondary | ICD-10-CM

## 2014-03-30 DIAGNOSIS — Z309 Encounter for contraceptive management, unspecified: Secondary | ICD-10-CM

## 2014-03-30 LAB — POCT URINE PREGNANCY: PREG TEST UR: NEGATIVE

## 2014-03-30 MED ORDER — ETONOGESTREL 68 MG ~~LOC~~ IMPL
68.0000 mg | DRUG_IMPLANT | Freq: Once | SUBCUTANEOUS | Status: AC
  Administered 2014-03-30: 68 mg via SUBCUTANEOUS

## 2014-03-31 NOTE — Progress Notes (Signed)
Patient ID: Joan Knight, female   DOB: [birth year], 28 y.o.   MRN: [id number] Patient here for contraceptive discussion. Cannot decide easily between IUD and nexplanon. After extensive discussion, she ultimately chose nexplanon of her IUD because she did not want to have to check for her IUD strings every month. PROCEDURE NOTE: [place] Patient given informed consent and signed copy in the chart.  Pregnancy test was  negative  Appropriate time out was taken. Left  arm was prepped and draped in the usual sterile fashion. Appropriate measurement was made for insertion of nexplanon and landmarks identified, insertion site marked. Two cc of 1%lidocaine  was used for local anesthesia. Once anesthesia obtained, nexplanon was inserted in typical fashion. No complications. Pressure bandage applied to decrease bruising. Patient given follow up instructions should she experience redness, swelling at sight or fever in the next 24 hours. Patient given Nexplanon pocket card.

## 2014-07-14 ENCOUNTER — Other Ambulatory Visit (HOSPITAL_COMMUNITY)
Admission: RE | Admit: 2014-07-14 | Discharge: 2014-07-14 | Disposition: A | Discharge status: Home / Self Care | Payer: BC Managed Care – PPO | Source: Ambulatory Visit | Attending: Internal Medicine | Admitting: Internal Medicine

## 2014-07-14 ENCOUNTER — Ambulatory Visit: Payer: BC Managed Care – PPO | Attending: Internal Medicine | Admitting: Internal Medicine

## 2014-07-14 ENCOUNTER — Encounter: Payer: Self-pay | Admitting: Internal Medicine

## 2014-07-14 VITALS — BP 111/72 | HR 85 | Temp 98.6°F | Resp 16 | Ht 62.0 in | Wt 145.0 lb

## 2014-07-14 DIAGNOSIS — N898 Other specified noninflammatory disorders of vagina: Secondary | ICD-10-CM | POA: Insufficient documentation

## 2014-07-14 DIAGNOSIS — Z862 Personal history of diseases of the blood and blood-forming organs and certain disorders involving the immune mechanism: Secondary | ICD-10-CM | POA: Insufficient documentation

## 2014-07-14 DIAGNOSIS — L304 Erythema intertrigo: Secondary | ICD-10-CM | POA: Diagnosis not present

## 2014-07-14 DIAGNOSIS — L309 Dermatitis, unspecified: Secondary | ICD-10-CM | POA: Diagnosis not present

## 2014-07-14 DIAGNOSIS — Z86718 Personal history of other venous thrombosis and embolism: Secondary | ICD-10-CM | POA: Insufficient documentation

## 2014-07-14 DIAGNOSIS — N760 Acute vaginitis: Secondary | ICD-10-CM | POA: Diagnosis present

## 2014-07-14 DIAGNOSIS — Z113 Encounter for screening for infections with a predominantly sexual mode of transmission: Secondary | ICD-10-CM | POA: Diagnosis present

## 2014-07-14 DIAGNOSIS — F17210 Nicotine dependence, cigarettes, uncomplicated: Secondary | ICD-10-CM | POA: Insufficient documentation

## 2014-07-14 DIAGNOSIS — Z79899 Other long term (current) drug therapy: Secondary | ICD-10-CM | POA: Diagnosis not present

## 2014-07-14 DIAGNOSIS — Z23 Encounter for immunization: Secondary | ICD-10-CM

## 2014-07-14 LAB — POCT URINALYSIS DIPSTICK
BILIRUBIN UA: NEGATIVE
GLUCOSE UA: NEGATIVE
Ketones, UA: NEGATIVE
Leukocytes, UA: NEGATIVE
NITRITE UA: NEGATIVE
Protein, UA: NEGATIVE
Spec Grav, UA: 1.02
Urobilinogen, UA: 0.2
pH, UA: 6.5

## 2014-07-14 MED ORDER — NYSTATIN-TRIAMCINOLONE 100000-0.1 UNIT/GM-% EX OINT: 1.0000 "application " | TOPICAL_OINTMENT | Freq: Two times a day (BID) | CUTANEOUS | Status: DC

## 2014-07-14 NOTE — Patient Instructions (Signed)
Smoking Cessation Quitting smoking is important to your health and has many advantages. However, it is not always easy to quit since nicotine is a very addictive drug. Oftentimes, people try 3 times or more before being able to quit. This document explains the best ways for you to prepare to quit smoking. Quitting takes hard work and a lot of effort, but you can do it. ADVANTAGES OF QUITTING SMOKING  You will live longer, feel better, and live better.  Your body will feel the impact of quitting smoking almost immediately.  Within 20 minutes, blood pressure decreases. Your pulse returns to its normal level.  After 8 hours, carbon monoxide levels in the blood return to normal. Your oxygen level increases.  After 24 hours, the chance of having a heart attack starts to decrease. Your breath, hair, and body stop smelling like smoke.  After 48 hours, damaged nerve endings begin to recover. Your sense of taste and smell improve.  After 72 hours, the body is virtually free of nicotine. Your bronchial tubes relax and breathing becomes easier.  After 2 to 12 weeks, lungs can hold more air. Exercise becomes easier and circulation improves.  The risk of having a heart attack, stroke, cancer, or lung disease is greatly reduced.  After 1 year, the risk of coronary heart disease is cut in half.  After 5 years, the risk of stroke falls to the same as a nonsmoker.  After 10 years, the risk of lung cancer is cut in half and the risk of other cancers decreases significantly.  After 15 years, the risk of coronary heart disease drops, usually to the level of a nonsmoker.  If you are pregnant, quitting smoking will improve your chances of having a healthy baby.  The people you live with, especially any children, will be healthier.  You will have extra money to spend on things other than cigarettes. QUESTIONS TO THINK ABOUT BEFORE ATTEMPTING TO QUIT You may want to talk about your answers with your  health care provider.  Why do you want to quit?  If you tried to quit in the past, what helped and what did not?  What will be the most difficult situations for you after you quit? How will you plan to handle them?  Who can help you through the tough times? Your family? Friends? A health care provider?  What pleasures do you get from smoking? What ways can you still get pleasure if you quit? Here are some questions to ask your health care provider:  How can you help me to be successful at quitting?  What medicine do you think would be best for me and how should I take it?  What should I do if I need more help?  What is smoking withdrawal like? How can I get information on withdrawal? GET READY  Set a quit date.  Change your environment by getting rid of all cigarettes, ashtrays, matches, and lighters in your home, car, or work. Do not let people smoke in your home.  Review your past attempts to quit. Think about what worked and what did not. GET SUPPORT AND ENCOURAGEMENT You have a better chance of being successful if you have help. You can get support in many ways.  Tell your family, friends, and coworkers that you are going to quit and need their support. Ask them not to smoke around you.  Get individual, group, or telephone counseling and support. Programs are available at local hospitals and health centers. Call   your local health department for information about programs in your area.  Spiritual beliefs and practices may help some smokers quit.  Download a "quit meter" on your computer to keep track of quit statistics, such as how long you have gone without smoking, cigarettes not smoked, and money saved.  Get a self-help book about quitting smoking and staying off tobacco. LEARN NEW SKILLS AND BEHAVIORS  Distract yourself from urges to smoke. Talk to someone, go for a walk, or occupy your time with a task.  Change your normal routine. Take a different route to work.  Drink tea instead of coffee. Eat breakfast in a different place.  Reduce your stress. Take a hot bath, exercise, or read a book.  Plan something enjoyable to do every day. Reward yourself for not smoking.  Explore interactive web-based programs that specialize in helping you quit. GET MEDICINE AND USE IT CORRECTLY Medicines can help you stop smoking and decrease the urge to smoke. Combining medicine with the above behavioral methods and support can greatly increase your chances of successfully quitting smoking.  Nicotine replacement therapy helps deliver nicotine to your body without the negative effects and risks of smoking. Nicotine replacement therapy includes nicotine gum, lozenges, inhalers, nasal sprays, and skin patches. Some may be available over-the-counter and others require a prescription.  Antidepressant medicine helps people abstain from smoking, but how this works is unknown. This medicine is available by prescription.  Nicotinic receptor partial agonist medicine simulates the effect of nicotine in your brain. This medicine is available by prescription. Ask your health care provider for advice about which medicines to use and how to use them based on your health history. Your health care provider will tell you what side effects to look out for if you choose to be on a medicine or therapy. Carefully read the information on the package. Do not use any other product containing nicotine while using a nicotine replacement product.  RELAPSE OR DIFFICULT SITUATIONS Most relapses occur within the first 3 months after quitting. Do not be discouraged if you start smoking again. Remember, most people try several times before finally quitting. You may have symptoms of withdrawal because your body is used to nicotine. You may crave cigarettes, be irritable, feel very hungry, cough often, get headaches, or have difficulty concentrating. The withdrawal symptoms are only temporary. They are strongest  when you first quit, but they will go away within 10-14 days. To reduce the chances of relapse, try to:  Avoid drinking alcohol. Drinking lowers your chances of successfully quitting.  Reduce the amount of caffeine you consume. Once you quit smoking, the amount of caffeine in your body increases and can give you symptoms, such as a rapid heartbeat, sweating, and anxiety.  Avoid smokers because they can make you want to smoke.  Do not let weight gain distract you. Many smokers will gain weight when they quit, usually less than 10 pounds. Eat a healthy diet and stay active. You can always lose the weight gained after you quit.  Find ways to improve your mood other than smoking. FOR MORE INFORMATION  www.smokefree.gov  Document Released: 09/16/2001 Document Revised: 02/06/2014 Document Reviewed: 01/01/2012 Exit[employer] Patient Information 2015 [employer]. This information is not intended to replace advice given to you by your health care provider. Make sure you discuss any questions you have with your health care provider.  

## 2014-07-14 NOTE — Progress Notes (Signed)
Patient ID: Joan Knight, female   DOB: 04-Feb-1986, 28 y.o.   MRN: [id number]  CC: vaginal discharge  HPI:  Patient reports that she had Nexplanon for the past three months.  She reports that this is her first period since insertion.  She states that the cycle started out heavy and has now leveled off to spotting.  She states that she has had a creamy discharge with no associated odor that has been presents for the past three weeks.  She would like to be checked for yeast and BV.  She denies recent unprotected sex.   She also states that she has been having a exacerbation of her eczema for the past two weeks.  She states that she has ben getting hot more frequently and sweating on her neck that is causing irritation and itching.  She states that she has noticed a similar rash under bilateral breast as well.    No Known Allergies Past Medical History  Diagnosis Date  . Sickle cell trait   . DVT (deep venous thrombosis)    Current Outpatient Prescriptions on File Prior to Visit  Medication Sig Dispense Refill  . acetaminophen (TYLENOL) 500 MG tablet Take 1,000 mg by mouth 3 (three) times daily as needed for pain.      . fluconazole (DIFLUCAN) 150 MG tablet Take 1 tablet (150 mg total) by mouth once.  1 tablet  0  . metroNIDAZOLE (FLAGYL) 500 MG tablet Take 1 tablet (500 mg total) by mouth 2 (two) times daily.  14 tablet  0  . omeprazole (PRILOSEC) 20 MG capsule Take 1 capsule (20 mg total) by mouth daily. 30 minutes before meal  30 capsule  3  . polyethylene glycol powder (GLYCOLAX/MIRALAX) powder Take 17 g by mouth daily.  850 g  1  . triamcinolone cream (KENALOG) 0.1 % Apply 1 application topically 2 (two) times daily. Do not apply to face  30 g  1   No current facility-administered medications on file prior to visit.   History reviewed. No pertinent family history. History   Social History  . Marital Status: Single    Spouse Name: N/A    Number of Children: N/A  . Years of Education:  N/A   Occupational History  . Not on file.   Social History Main Topics  . Smoking status: Current Every Day Smoker -- 0.50 packs/day    Types: Cigarettes  . Smokeless tobacco: Not on file  . Alcohol Use: No  . Drug Use: No  . Sexual Activity: Not on file   Other Topics Concern  . Not on file   Social History Narrative  . No narrative on file    Review of Systems: See hpi    Objective:   Filed Vitals:   07/14/14 1202  BP: 111/72  Pulse: 85  Temp: 98.6 F (37 C)  Resp: 16    Physical Exam  Constitutional: She is oriented to person, place, and time.  Cardiovascular: Normal rate, regular rhythm and normal heart sounds.   Pulmonary/Chest: Effort normal and breath sounds normal.  Genitourinary: Vagina normal and uterus normal. No vaginal discharge found.  Musculoskeletal: Normal range of motion.  Neurological: She is alert and oriented to person, place, and time.  Skin: Rash (under bilateral breast, back of neck) noted.   .  Lab Results  Component Value Date   WBC 8.8 03/07/2014   HGB 13.1 03/07/2014   HCT 38.4 03/07/2014   MCV 88.3 03/07/2014  PLT 298 03/07/2014   Lab Results  Component Value Date   CREATININE 0.79 03/07/2014   BUN 12 03/07/2014   NA 141 03/07/2014   K 3.7 03/07/2014   CL 105 03/07/2014   CO2 27 03/07/2014    Lab Results  Component Value Date   HGBA1C 5.2 03/07/2014   Lipid Panel  No results found for this basename: chol, trig, hdl, cholhdl, vldl, ldlcalc       Assessment and plan:   Joan Knight was seen today for follow-up.  Diagnoses and associated orders for this visit:  Vaginal discharge - Urinalysis Dipstick - Cervicovaginal ancillary only  Eczema May continue to use Kenalog cream, make appointment with Dermatology  Intertrigo - nystatin-triamcinolone ointment (MYCOLOG); Apply 1 application topically 2 (two) times daily. Apply under breast  Encounter for immunization Received flu shot  Return if symptoms worsen or fail to  improve.        Joan Manning, NP-C [facility] [phone] 07/16/2014, 10:35 PM

## 2014-07-14 NOTE — Progress Notes (Signed)
Pt has a rash on her neck. Pt is requesting to check for a yeast infection and stds. Pt is having a creamy discharge.

## 2014-07-17 LAB — CERVICOVAGINAL ANCILLARY ONLY
Chlamydia: NEGATIVE
NEISSERIA GONORRHEA: NEGATIVE
Wet Prep (BD Affirm): NEGATIVE
Wet Prep (BD Affirm): NEGATIVE
Wet Prep (BD Affirm): POSITIVE — AB

## 2014-07-20 ENCOUNTER — Telehealth: Payer: Self-pay | Admitting: Emergency Medicine

## 2014-07-20 NOTE — Telephone Encounter (Signed)
Message copied by Ricci Barker on Thu Jul 20, 2014  4:44 PM ------      Message from: Chari Manning A      Created: Tue Jul 18, 2014  8:10 PM       Patient is positive for BV. Please send metrogel 1 applicator QHS for 7 days. No alcohol. ------

## 2014-07-20 NOTE — Telephone Encounter (Signed)
Pt given pap smear results with instructions to apply Metro gel x 7 days QHS and to refrain from alcohol Pt requesting Dermatology referral. Please f.u

## 2014-07-21 NOTE — Telephone Encounter (Signed)
May send referral.  

## 2014-07-24 ENCOUNTER — Telehealth: Payer: Self-pay | Admitting: Emergency Medicine

## 2014-07-24 ENCOUNTER — Other Ambulatory Visit: Payer: Self-pay | Admitting: Emergency Medicine

## 2014-07-24 DIAGNOSIS — L309 Dermatitis, unspecified: Secondary | ICD-10-CM

## 2014-08-25 ENCOUNTER — Other Ambulatory Visit: Payer: Self-pay | Admitting: *Deleted

## 2014-08-25 ENCOUNTER — Telehealth: Payer: Self-pay | Admitting: Internal Medicine

## 2014-08-25 MED ORDER — METRONIDAZOLE 0.75 % VA GEL: 1.0000 | Freq: Two times a day (BID) | VAGINAL | Status: DC

## 2014-08-25 NOTE — Progress Notes (Signed)
Medication wasn't sent to pharmacy for the vaginal gel. I sent the rx to outpatient pharm.

## 2014-09-05 ENCOUNTER — Telehealth: Payer: Self-pay | Admitting: *Deleted

## 2014-09-05 NOTE — Telephone Encounter (Signed)
Patient called requesting STD testing unaware that this was done at her last visit. Lab results reviewed with patient Patient states that she just started the metrogel 3 nights ago. Instructed patient to finish all doses of metrogel and avoid alcohol while taking this med Patient states understanding

## 2015-12-26 ENCOUNTER — Emergency Department (HOSPITAL_COMMUNITY)
Admission: EM | Admit: 2015-12-26 | Discharge: 2015-12-26 | Disposition: A | Discharge status: Home / Self Care | Payer: Self-pay | Attending: Emergency Medicine | Admitting: Emergency Medicine

## 2015-12-26 ENCOUNTER — Encounter (HOSPITAL_COMMUNITY): Payer: Self-pay | Admitting: Family Medicine

## 2015-12-26 ENCOUNTER — Emergency Department (HOSPITAL_COMMUNITY): Payer: Self-pay

## 2015-12-26 DIAGNOSIS — Z86718 Personal history of other venous thrombosis and embolism: Secondary | ICD-10-CM | POA: Insufficient documentation

## 2015-12-26 DIAGNOSIS — N760 Acute vaginitis: Secondary | ICD-10-CM | POA: Insufficient documentation

## 2015-12-26 DIAGNOSIS — R109 Unspecified abdominal pain: Secondary | ICD-10-CM

## 2015-12-26 DIAGNOSIS — F17210 Nicotine dependence, cigarettes, uncomplicated: Secondary | ICD-10-CM | POA: Insufficient documentation

## 2015-12-26 DIAGNOSIS — B9689 Other specified bacterial agents as the cause of diseases classified elsewhere: Secondary | ICD-10-CM

## 2015-12-26 DIAGNOSIS — Z79899 Other long term (current) drug therapy: Secondary | ICD-10-CM | POA: Insufficient documentation

## 2015-12-26 DIAGNOSIS — Z862 Personal history of diseases of the blood and blood-forming organs and certain disorders involving the immune mechanism: Secondary | ICD-10-CM | POA: Insufficient documentation

## 2015-12-26 DIAGNOSIS — Z3202 Encounter for pregnancy test, result negative: Secondary | ICD-10-CM | POA: Insufficient documentation

## 2015-12-26 LAB — COMPREHENSIVE METABOLIC PANEL
ALBUMIN: 4.3 g/dL (ref 3.5–5.0)
ALK PHOS: 48 U/L (ref 38–126)
ALT: 16 U/L (ref 14–54)
ANION GAP: 12 (ref 5–15)
AST: 19 U/L (ref 15–41)
BILIRUBIN TOTAL: 0.4 mg/dL (ref 0.3–1.2)
BUN: 11 mg/dL (ref 6–20)
CALCIUM: 9.7 mg/dL (ref 8.9–10.3)
CO2: 21 mmol/L — ABNORMAL LOW (ref 22–32)
Chloride: 107 mmol/L (ref 101–111)
Creatinine, Ser: 0.74 mg/dL (ref 0.44–1.00)
GFR calc Af Amer: >60 mL/min (ref >60)
GFR calc non Af Amer: >60 mL/min (ref >60)
GLUCOSE: 108 mg/dL — AB (ref 65–99)
Potassium: 3.9 mmol/L (ref 3.5–5.1)
Sodium: 140 mmol/L (ref 135–145)
TOTAL PROTEIN: 7.4 g/dL (ref 6.5–8.1)

## 2015-12-26 LAB — URINALYSIS, ROUTINE W REFLEX MICROSCOPIC
BILIRUBIN URINE: NEGATIVE
Glucose, UA: NEGATIVE mg/dL
Hgb urine dipstick: NEGATIVE
KETONES UR: NEGATIVE mg/dL
Leukocytes, UA: NEGATIVE
NITRITE: NEGATIVE
Protein, ur: NEGATIVE mg/dL
Specific Gravity, Urine: 1.029 (ref 1.005–1.030)
pH: 7.5 (ref 5.0–8.0)

## 2015-12-26 LAB — CBC
HCT: 37.1 % (ref 36.0–46.0)
Hemoglobin: 13.5 g/dL (ref 12.0–15.0)
MCH: 30.3 pg (ref 26.0–34.0)
MCHC: 36.4 g/dL — AB (ref 30.0–36.0)
MCV: 83.4 fL (ref 78.0–100.0)
PLATELETS: 286 10*3/uL (ref 150–400)
RBC: 4.45 MIL/uL (ref 3.87–5.11)
RDW: 13.8 % (ref 11.5–15.5)
WBC: 7.2 10*3/uL (ref 4.0–10.5)

## 2015-12-26 LAB — WET PREP, GENITAL
SPERM: NONE SEEN
Trich, Wet Prep: NONE SEEN
WBC WET PREP: NONE SEEN
YEAST WET PREP: NONE SEEN

## 2015-12-26 LAB — I-STAT BETA HCG BLOOD, ED (MC, WL, AP ONLY): I-stat hCG, quantitative: <5 m[IU]/mL (ref <5)

## 2015-12-26 LAB — LIPASE, BLOOD: Lipase: 36 U/L (ref 11–51)

## 2015-12-26 MED ORDER — ACETAMINOPHEN 500 MG PO TABS
1000.0000 mg | ORAL_TABLET | Freq: Once | ORAL | Status: AC
  Administered 2015-12-26: 1000 mg via ORAL
  Filled 2015-12-26: qty 2

## 2015-12-26 MED ORDER — KETOROLAC TROMETHAMINE 30 MG/ML IJ SOLN
30.0000 mg | Freq: Once | INTRAMUSCULAR | Status: AC
  Administered 2015-12-26: 30 mg via INTRAMUSCULAR
  Filled 2015-12-26: qty 1

## 2015-12-26 MED ORDER — METRONIDAZOLE 500 MG PO TABS: 500.0000 mg | ORAL_TABLET | Freq: Two times a day (BID) | ORAL | Status: DC

## 2015-12-26 MED ORDER — IBUPROFEN 400 MG PO TABS
400.0000 mg | ORAL_TABLET | Freq: Once | ORAL | Status: AC
  Administered 2015-12-26: 400 mg via ORAL
  Filled 2015-12-26: qty 1

## 2015-12-26 MED ORDER — KETOROLAC TROMETHAMINE 30 MG/ML IJ SOLN: 30.0000 mg | Freq: Once | INTRAMUSCULAR | Status: DC

## 2015-12-26 MED FILL — metroNIDAZOLE 500 MG TABS: 500 | 7 days supply | Qty: 14 | Fill #0

## 2015-12-26 NOTE — ED Notes (Signed)
Pt here for generalized abd pain, nausea, diarrhea since yesterday.

## 2015-12-26 NOTE — ED Notes (Signed)
Sever abd pain sine 3 am had fried chic for dinner denies vag d/c or dysuria but wants to be checked for std

## 2015-12-26 NOTE — ED Provider Notes (Signed)
CSN: [id number]     Arrival date & time 12/26/15  0820 History   First MD Initiated Contact with Patient 12/26/15 1011     Chief Complaint  Patient presents with  . Abdominal Pain   (Consider location/radiation/quality/duration/timing/severity/associated sxs/prior Treatment) HPI 30 y.o. female presents to the Emergency Department today complaining of abdominal pain that started around 3AM this morning. States that pain is 10/10 and sharp. Woke her from her sleep. Notes that the pain migrates from right to left. Noted N/V/D. No fevers. Does not endorse any vaginal bleeding/discahrge. No dysuria. Pt is sexually active. Pt has Implanon device. No CP/SOB. No other symptoms noted.    Past Medical History  Diagnosis Date  . Sickle cell trait ([place])   . DVT (deep venous thrombosis) ([place])    History reviewed. No pertinent past surgical history. History reviewed. No pertinent family history. Social History  Substance Use Topics  . Smoking status: Current Every Day Smoker -- 0.50 packs/day    Types: Cigarettes  . Smokeless tobacco: None  . Alcohol Use: No   OB History    No data available     Review of Systems ROS reviewed and all are negative for acute change except as noted in the HPI.  Allergies  Review of patient's allergies indicates no known allergies.  Home Medications   Prior to Admission medications   Medication Sig Start Date End Date Taking? Authorizing Provider  acetaminophen (TYLENOL) 500 MG tablet Take 1,000 mg by mouth 3 (three) times daily as needed for pain.   Yes Historical Provider, MD  Etonogestrel (IMPLANON [place]) Inject 1 application into the skin one time only at 6 PM.   Yes Historical Provider, MD  nystatin-triamcinolone ointment (MYCOLOG) Apply 1 application topically 2 (two) times daily. Apply under breast 07/14/14  Yes Lance Bosch, NP  omeprazole (PRILOSEC) 20 MG capsule Take 1 capsule (20 mg total) by mouth daily. 30 minutes before meal 03/07/14  Yes Lance Bosch, NP  polyethylene glycol powder (GLYCOLAX/MIRALAX) powder Take 17 g by mouth daily. 03/07/14  Yes Lance Bosch, NP  triamcinolone cream (KENALOG) 0.1 % Apply 1 application topically 2 (two) times daily. Do not apply to face 03/07/14  Yes Lance Bosch, NP  fluconazole (DIFLUCAN) 150 MG tablet Take 1 tablet (150 mg total) by mouth once. 03/10/14   Lance Bosch, NP  metroNIDAZOLE (FLAGYL) 500 MG tablet Take 1 tablet (500 mg total) by mouth 2 (two) times daily. 03/10/14   Lance Bosch, NP  metroNIDAZOLE (METROGEL VAGINAL) 0.75 % vaginal gel Place 1 Applicatorful vaginally 2 (two) times daily. 08/25/14   Tresa Garter, MD  rivaroxaban (XARELTO) 10 MG TABS tablet Take 10 mg by mouth daily.    Historical Provider, MD   BP 115/81 mmHg  Pulse 83  Temp(Src) 97.9 F (36.6 C) (Oral)  Resp 18  SpO2 99%   Physical Exam  Constitutional: She is oriented to person, place, and time. She appears well-developed and well-nourished.  HENT:  Head: Normocephalic and atraumatic.  Eyes: EOM are normal. Pupils are equal, round, and reactive to light.  Neck: Normal range of motion. Neck supple. No tracheal deviation present.  Cardiovascular: Normal rate, regular rhythm and normal heart sounds.   Pulmonary/Chest: Effort normal and breath sounds normal. No respiratory distress. She has no wheezes. She has no rales. She exhibits no tenderness.  Abdominal: Soft. Normal appearance and bowel sounds are normal. There is tenderness in the right lower quadrant, suprapubic  area and left lower quadrant. There is no rigidity, no rebound, no guarding, no CVA tenderness, no tenderness at McBurney's point and negative Murphy's sign.  Musculoskeletal: Normal range of motion.  Neurological: She is alert and oriented to person, place, and time.  Skin: Skin is warm and dry.  Psychiatric: She has a normal mood and affect. Her behavior is normal. Thought content normal.  Nursing note and vitals reviewed. Exam performed by  Ozella Rocks,  exam chaperoned Date: 12/26/2015 Pelvic exam: normal external genitalia without evidence of trauma. VULVA: normal appearing vulva with no masses, tenderness or lesion. VAGINA: normal appearing vagina with normal color and discharge, no lesions. CERVIX: normal appearing cervix without lesions, cervical motion tenderness absent, cervical os closed with out purulent discharge; vaginal discharge - curd-like, Wet prep and DNA probe for chlamydia and GC obtained.   ADNEXA: normal adnexa in size, nontender and no masses UTERUS: uterus is normal size, shape, consistency and nontender.   ED Course  Procedures (including critical care time) Labs Review Labs Reviewed  WET PREP, GENITAL - Abnormal; Notable for the following:    Clue Cells Wet Prep HPF POC PRESENT (*)    All other components within normal limits  COMPREHENSIVE METABOLIC PANEL - Abnormal; Notable for the following:    CO2 21 (*)    Glucose, Bld 108 (*)    All other components within normal limits  CBC - Abnormal; Notable for the following:    MCHC 36.4 (*)    All other components within normal limits  LIPASE, BLOOD  URINALYSIS, ROUTINE W REFLEX MICROSCOPIC (NOT AT [facility])  I-STAT BETA HCG BLOOD, ED ([facility], [facility], [facility] ONLY)  GC/CHLAMYDIA PROBE AMP ([place]) NOT AT [facility]   Imaging Review US Transvaginal Non-ob  12/26/2015  CLINICAL DATA:  Pelvic pain for several hours EXAM: TRANSABDOMINAL AND TRANSVAGINAL ULTRASOUND OF PELVIS DOPPLER ULTRASOUND OF OVARIES TECHNIQUE: Both transabdominal and transvaginal ultrasound examinations of the pelvis were performed. Transabdominal technique was performed for global imaging of the pelvis including uterus, ovaries, adnexal regions, and pelvic cul-de-sac. It was necessary to proceed with endovaginal exam following the transabdominal exam to visualize the ovaries. Color and duplex Doppler ultrasound was utilized to evaluate blood flow to the ovaries. COMPARISON:  None. FINDINGS: Uterus  Measurements: 6.7 x 2.7 x 2.7 cm. A small 8 mm fibroid is noted posteriorly near the fundus. Endometrium Thickness: 2.4 mm.  No focal abnormality visualized. Right ovary Measurements: 2.8 x 1.7 x 1.9 cm. A 1 cm cyst is noted within the right ovary. Left ovary Measurements: 2.6 x 2.4 x 2.9 cm. 9 mm cyst is noted within the left ovary. Multiple small follicles are seen. Pulsed Doppler evaluation of both ovaries demonstrates normal low-resistance arterial and venous waveforms. Other findings No abnormal free fluid. IMPRESSION: Small ovarian cysts.  No acute abnormality is noted. Electronically Signed   By: Inez Catalina M.D.   On: 12/26/2015 12:00   US Pelvis Complete  12/26/2015  CLINICAL DATA:  Pelvic pain for several hours EXAM: TRANSABDOMINAL AND TRANSVAGINAL ULTRASOUND OF PELVIS DOPPLER ULTRASOUND OF OVARIES TECHNIQUE: Both transabdominal and transvaginal ultrasound examinations of the pelvis were performed. Transabdominal technique was performed for global imaging of the pelvis including uterus, ovaries, adnexal regions, and pelvic cul-de-sac. It was necessary to proceed with endovaginal exam following the transabdominal exam to visualize the ovaries. Color and duplex Doppler ultrasound was utilized to evaluate blood flow to the ovaries. COMPARISON:  None. FINDINGS: Uterus Measurements: 6.7 x 2.7 x 2.7  cm. A small 8 mm fibroid is noted posteriorly near the fundus. Endometrium Thickness: 2.4 mm.  No focal abnormality visualized. Right ovary Measurements: 2.8 x 1.7 x 1.9 cm. A 1 cm cyst is noted within the right ovary. Left ovary Measurements: 2.6 x 2.4 x 2.9 cm. 9 mm cyst is noted within the left ovary. Multiple small follicles are seen. Pulsed Doppler evaluation of both ovaries demonstrates normal low-resistance arterial and venous waveforms. Other findings No abnormal free fluid. IMPRESSION: Small ovarian cysts.  No acute abnormality is noted. Electronically Signed   By: Inez Catalina M.D.   On: 12/26/2015  12:00   [place] Art/ven Flow Abd Pelv Doppler  12/26/2015  CLINICAL DATA:  Pelvic pain for several hours EXAM: TRANSABDOMINAL AND TRANSVAGINAL ULTRASOUND OF PELVIS DOPPLER ULTRASOUND OF OVARIES TECHNIQUE: Both transabdominal and transvaginal ultrasound examinations of the pelvis were performed. Transabdominal technique was performed for global imaging of the pelvis including uterus, ovaries, adnexal regions, and pelvic cul-de-sac. It was necessary to proceed with endovaginal exam following the transabdominal exam to visualize the ovaries. Color and duplex Doppler ultrasound was utilized to evaluate blood flow to the ovaries. COMPARISON:  None. FINDINGS: Uterus Measurements: 6.7 x 2.7 x 2.7 cm. A small 8 mm fibroid is noted posteriorly near the fundus. Endometrium Thickness: 2.4 mm.  No focal abnormality visualized. Right ovary Measurements: 2.8 x 1.7 x 1.9 cm. A 1 cm cyst is noted within the right ovary. Left ovary Measurements: 2.6 x 2.4 x 2.9 cm. 9 mm cyst is noted within the left ovary. Multiple small follicles are seen. Pulsed Doppler evaluation of both ovaries demonstrates normal low-resistance arterial and venous waveforms. Other findings No abnormal free fluid. IMPRESSION: Small ovarian cysts.  No acute abnormality is noted. Electronically Signed   By: Inez Catalina M.D.   On: 12/26/2015 12:00   I have personally reviewed and evaluated these images and lab results as part of my medical decision-making.   EKG Interpretation None      MDM  I have reviewed and evaluated the relevant laboratory values.I have reviewed and evaluated the relevant imaging studies.I personally evaluated and interpreted the relevant EKG.I have reviewed the relevant previous healthcare records.I have reviewed EMS Documentation.I obtained HPI from historian. Patient discussed with supervising physician  ED Course:  Assessment: Patient is a 29yF presents with abdominal pain x 3AM. On exam, nontoxic, nonseptic appearing, in no  apparent distress. Patient's pain and other symptoms adequately managed in emergency department.  Made NPO. Fluid bolus given.  Labs, imaging and vitals reviewed.  Patient does not meet the SIRS or Sepsis criteria.  On repeat exam patient does not have a surgical abdomen and there are no peritoneal signs.  No indication of appendicitis, bowel obstruction, bowel perforation, cholecystitis, diverticulitis, PID or ectopic pregnancy. Transvaginal US revealed no ovarian torsion. Wet prep positive for BV. Will treat with Flagyl. Patient discharged home with symptomatic treatment and given strict instructions for follow-up with their primary care physician.  I have also discussed reasons to return immediately to the ER.  Patient expresses understanding and agrees with plan.  Disposition/Plan:  DC Home Additional Verbal discharge instructions given and discussed with patient.  Pt Instructed to f/u with PCP in the next 48-72 hours for evaluation and treatment of symptoms. Return precautions given Pt acknowledges and agrees with plan  Supervising Physician Malvin Johns, MD   Final diagnoses:  Abdominal pain  Bacterial vaginosis      Shary Decamp, PA-C 12/26/15 [place],  MD 12/26/15 1610

## 2015-12-26 NOTE — Discharge Instructions (Signed)
Please read and follow all provided instructions.  Your diagnoses today include:  1. Bacterial vaginosis   2. Abdominal pain    Tests performed today include:  Vital signs. See below for your results today.   Medications prescribed:   Take medication as prescribed   Home care instructions:  Follow any educational materials contained in this packet.  Follow-up instructions: Please follow-up with your primary care provider in the next 48 hours for further evaluation of symptoms and treatment   Return instructions:   Please return to the Emergency Department if you do not get better, if you get worse, or new symptoms OR  - Fever (temperature greater than 101.35F)  - Bleeding that does not stop with holding pressure to the area    -Severe pain (please note that you may be more sore the day after your accident)  - Chest Pain  - Difficulty breathing  - Severe nausea or vomiting  - Inability to tolerate food and liquids  - Passing out  - Skin becoming red around your wounds  - Change in mental status (confusion or lethargy)  - New numbness or weakness     Please return if you have any other emergent concerns.  Additional Information:  Your vital signs today were: BP 115/81 mmHg   Pulse 83   Temp(Src) 97.9 F (36.6 C) (Oral)   Resp 18   SpO2 99% If your blood pressure (BP) was elevated above 135/85 this visit, please have this repeated by your doctor within one month. ---------------

## 2015-12-27 LAB — GC/CHLAMYDIA PROBE AMP (~~LOC~~) NOT AT ARMC
CHLAMYDIA, DNA PROBE: NEGATIVE
Neisseria Gonorrhea: NEGATIVE

## 2015-12-29 ENCOUNTER — Encounter (HOSPITAL_COMMUNITY): Payer: Self-pay | Admitting: Emergency Medicine

## 2015-12-29 DIAGNOSIS — Z7952 Long term (current) use of systemic steroids: Secondary | ICD-10-CM | POA: Insufficient documentation

## 2015-12-29 DIAGNOSIS — F17210 Nicotine dependence, cigarettes, uncomplicated: Secondary | ICD-10-CM | POA: Insufficient documentation

## 2015-12-29 DIAGNOSIS — Z86718 Personal history of other venous thrombosis and embolism: Secondary | ICD-10-CM | POA: Insufficient documentation

## 2015-12-29 DIAGNOSIS — Z862 Personal history of diseases of the blood and blood-forming organs and certain disorders involving the immune mechanism: Secondary | ICD-10-CM | POA: Insufficient documentation

## 2015-12-29 DIAGNOSIS — Z79899 Other long term (current) drug therapy: Secondary | ICD-10-CM | POA: Insufficient documentation

## 2015-12-29 DIAGNOSIS — Z3202 Encounter for pregnancy test, result negative: Secondary | ICD-10-CM | POA: Insufficient documentation

## 2015-12-29 DIAGNOSIS — R1084 Generalized abdominal pain: Secondary | ICD-10-CM | POA: Insufficient documentation

## 2015-12-29 MED ORDER — ONDANSETRON 4 MG PO TBDP
4.0000 mg | ORAL_TABLET | Freq: Once | ORAL | Status: AC | PRN
  Administered 2015-12-29: 4 mg via ORAL

## 2015-12-29 MED ORDER — ONDANSETRON 4 MG PO TBDP
ORAL_TABLET | ORAL | Status: AC
  Filled 2015-12-29: qty 1

## 2015-12-29 NOTE — ED Notes (Signed)
Patient seen here on Wednesday for abdominal pain, has not followed up with PCP.  Patient continues with abdominal pain, nausea and vomiting.

## 2015-12-30 ENCOUNTER — Encounter (HOSPITAL_COMMUNITY): Payer: Self-pay | Admitting: Radiology

## 2015-12-30 ENCOUNTER — Emergency Department (HOSPITAL_COMMUNITY): Payer: Self-pay

## 2015-12-30 ENCOUNTER — Emergency Department (HOSPITAL_COMMUNITY)
Admission: EM | Admit: 2015-12-30 | Discharge: 2015-12-30 | Disposition: A | Discharge status: Home / Self Care | Payer: Self-pay | Attending: Emergency Medicine | Admitting: Emergency Medicine

## 2015-12-30 DIAGNOSIS — R1084 Generalized abdominal pain: Secondary | ICD-10-CM

## 2015-12-30 HISTORY — DX: Sickle-cell disease without crisis: D57.1

## 2015-12-30 LAB — URINALYSIS, ROUTINE W REFLEX MICROSCOPIC
Bilirubin Urine: NEGATIVE
Glucose, UA: NEGATIVE mg/dL
Hgb urine dipstick: NEGATIVE
Ketones, ur: 15 mg/dL — AB
LEUKOCYTES UA: NEGATIVE
Nitrite: NEGATIVE
PROTEIN: 30 mg/dL — AB
Specific Gravity, Urine: 1.027 (ref 1.005–1.030)
pH: 6.5 (ref 5.0–8.0)

## 2015-12-30 LAB — COMPREHENSIVE METABOLIC PANEL
ALT: 13 U/L — AB (ref 14–54)
ANION GAP: 8 (ref 5–15)
AST: 17 U/L (ref 15–41)
Albumin: 3.9 g/dL (ref 3.5–5.0)
Alkaline Phosphatase: 48 U/L (ref 38–126)
BUN: 10 mg/dL (ref 6–20)
CALCIUM: 9.2 mg/dL (ref 8.9–10.3)
CHLORIDE: 110 mmol/L (ref 101–111)
CO2: 24 mmol/L (ref 22–32)
CREATININE: 0.7 mg/dL (ref 0.44–1.00)
Glucose, Bld: 105 mg/dL — ABNORMAL HIGH (ref 65–99)
Potassium: 3.3 mmol/L — ABNORMAL LOW (ref 3.5–5.1)
Sodium: 142 mmol/L (ref 135–145)
Total Bilirubin: 0.4 mg/dL (ref 0.3–1.2)
Total Protein: 6.5 g/dL (ref 6.5–8.1)

## 2015-12-30 LAB — CBC
HCT: 35.3 % — ABNORMAL LOW (ref 36.0–46.0)
HEMOGLOBIN: 12.4 g/dL (ref 12.0–15.0)
MCH: 29.7 pg (ref 26.0–34.0)
MCHC: 35.1 g/dL (ref 30.0–36.0)
MCV: 84.4 fL (ref 78.0–100.0)
PLATELETS: 276 10*3/uL (ref 150–400)
RBC: 4.18 MIL/uL (ref 3.87–5.11)
RDW: 13.7 % (ref 11.5–15.5)
WBC: 10.6 10*3/uL — AB (ref 4.0–10.5)

## 2015-12-30 LAB — URINE MICROSCOPIC-ADD ON

## 2015-12-30 LAB — LIPASE, BLOOD: LIPASE: 45 U/L (ref 11–51)

## 2015-12-30 LAB — POC URINE PREG, ED: PREG TEST UR: NEGATIVE

## 2015-12-30 MED ORDER — IOHEXOL 300 MG/ML  SOLN
100.0000 mL | Freq: Once | INTRAMUSCULAR | Status: AC | PRN
  Administered 2015-12-30: 100 mL via INTRAVENOUS

## 2015-12-30 MED ORDER — SODIUM CHLORIDE 0.9 % IV BOLUS (SEPSIS)
1000.0000 mL | Freq: Once | INTRAVENOUS | Status: AC
  Administered 2015-12-30: 1000 mL via INTRAVENOUS

## 2015-12-30 MED ORDER — MORPHINE SULFATE (PF) 4 MG/ML IV SOLN
4.0000 mg | Freq: Once | INTRAVENOUS | Status: AC
  Administered 2015-12-30: 4 mg via INTRAVENOUS
  Filled 2015-12-30: qty 1

## 2015-12-30 MED ORDER — ONDANSETRON HCL 4 MG/2ML IJ SOLN
4.0000 mg | Freq: Once | INTRAMUSCULAR | Status: AC
  Administered 2015-12-30: 4 mg via INTRAVENOUS
  Filled 2015-12-30: qty 2

## 2015-12-30 NOTE — Discharge Instructions (Signed)
Abdominal Pain, Adult Ms. Vivar, your CT scan does not show a cause for your pain.  Take tylenol or ibuprofen as needed and see your primary care doctor within 3 days for close follow up. If symptoms worsen, come back to the ED immediately. Thank you. Many things can cause belly (abdominal) pain. Most times, the belly pain is not dangerous. Many cases of belly pain can be watched and treated at home. HOME CARE   Do not take medicines that help you go poop (laxatives) unless told to by your doctor.  Only take medicine as told by your doctor.  Eat or drink as told by your doctor. Your doctor will tell you if you should be on a special diet. GET HELP IF:  You do not know what is causing your belly pain.  You have belly pain while you are sick to your stomach (nauseous) or have runny poop (diarrhea).  You have pain while you pee or poop.  Your belly pain wakes you up at night.  You have belly pain that gets worse or better when you eat.  You have belly pain that gets worse when you eat fatty foods.  You have a fever. GET HELP RIGHT AWAY IF:   The pain does not go away within 2 hours.  You keep throwing up (vomiting).  The pain changes and is only in the right or left part of the belly.  You have bloody or tarry looking poop. MAKE SURE YOU:   Understand these instructions.  Will watch your condition.  Will get help right away if you are not doing well or get worse.   This information is not intended to replace advice given to you by your health care provider. Make sure you discuss any questions you have with your health care provider.   Document Released: 03/10/2008 Document Revised: 10/13/2014 Document Reviewed: 06/01/2013 Elsevier Interactive Patient Education [employer].

## 2015-12-30 NOTE — ED Notes (Signed)
Pt requesting for more pain medication, Dr. Claudine Mouton to be notified.

## 2015-12-30 NOTE — ED Provider Notes (Signed)
CSN: [id number]     Arrival date & time 12/29/15  2306 History  By signing my name below, I, [name], attest that this documentation has been prepared under the direction and in the presence of physician practitioner, Everlene Balls, MD. Electronically Signed: [name], Scribe. 12/30/2015. 2:27 AM.    Chief Complaint  Patient presents with  . Abdominal Pain    The history is provided by the patient. No language interpreter was used.     HPI Comments: Joan Knight is a 30 y.o. female with h/o sickle cell trait and DVT who presents to the Emergency Department complaining of sudden onset, constant, worsening, generalized abdominal pain for the last 5 days. She notes that she came to the ER 5 days ago for the same issue; she was prescribed medication but does not think that it is working. She endorses abdominal pain exacerbation with eating and palpation throughout her abdomen. She notes that she has been experiencing nausea and hematemesis as well. Pt has been taking two 200 mg ibuprofen every two hours for her symptoms; she notes that she takes between 6-8 ibuprofen per day. Pt had an ultrasound taken in the ER 5 days ago which came back normal. She denies diarrhea, dysuria, hematuria, abnormal vaginal discharge/bleeding, or any other associated symptoms.  Past Medical History  Diagnosis Date  . Sickle cell trait ([place])   . DVT (deep venous thrombosis) ([place])    History reviewed. No pertinent past surgical history. No family history on file. Social History  Substance Use Topics  . Smoking status: Current Every Day Smoker -- 0.50 packs/day    Types: Cigarettes  . Smokeless tobacco: None  . Alcohol Use: No   OB History    No data available     Review of Systems  A complete 10 system review of systems was obtained and all systems are negative except as noted in the HPI and PMH.   Allergies  Review of patient's allergies indicates no known allergies.  Home Medications    Prior to Admission medications   Medication Sig Start Date End Date Taking? Authorizing Provider  acetaminophen (TYLENOL) 500 MG tablet Take 1,000 mg by mouth 3 (three) times daily as needed for pain.    Historical Provider, MD  Etonogestrel (IMPLANON [place]) Inject 1 application into the skin one time only at 6 PM.    Historical Provider, MD  fluconazole (DIFLUCAN) 150 MG tablet Take 1 tablet (150 mg total) by mouth once. 03/10/14   Lance Bosch, NP  metroNIDAZOLE (FLAGYL) 500 MG tablet Take 1 tablet (500 mg total) by mouth 2 (two) times daily. 12/26/15   Shary Decamp, PA-C  metroNIDAZOLE (METROGEL VAGINAL) 0.75 % vaginal gel Place 1 Applicatorful vaginally 2 (two) times daily. 08/25/14   Tresa Garter, MD  nystatin-triamcinolone ointment (MYCOLOG) Apply 1 application topically 2 (two) times daily. Apply under breast 07/14/14   Lance Bosch, NP  omeprazole (PRILOSEC) 20 MG capsule Take 1 capsule (20 mg total) by mouth daily. 30 minutes before meal 03/07/14   Lance Bosch, NP  polyethylene glycol powder (GLYCOLAX/MIRALAX) powder Take 17 g by mouth daily. 03/07/14   Lance Bosch, NP  rivaroxaban (XARELTO) 10 MG TABS tablet Take 10 mg by mouth daily.    Historical Provider, MD  triamcinolone cream (KENALOG) 0.1 % Apply 1 application topically 2 (two) times daily. Do not apply to face 03/07/14   Lance Bosch, NP   BP 125/83 mmHg  Pulse 86  Temp(Src) 97.9 F (36.6 C) (Oral)  Resp 18  SpO2 99% Physical Exam  Constitutional: She is oriented to person, place, and time. She appears well-developed and well-nourished. No distress.  HENT:  Head: Normocephalic and atraumatic.  Nose: Nose normal.  Mouth/Throat: Oropharynx is clear and moist. No oropharyngeal exudate.  Eyes: Conjunctivae and EOM are normal. Pupils are equal, round, and reactive to light. No scleral icterus.  Neck: Normal range of motion. Neck supple. No JVD present. No tracheal deviation present. No thyromegaly present.   Cardiovascular: Normal rate, regular rhythm and normal heart sounds.  Exam reveals no gallop and no friction rub.   No murmur heard. Pulmonary/Chest: Effort normal and breath sounds normal. No respiratory distress. She has no wheezes. She exhibits no tenderness.  Abdominal: Soft. Bowel sounds are normal. She exhibits no distension and no mass. There is no tenderness. There is no rebound and no guarding.  Musculoskeletal: Normal range of motion. She exhibits no edema or tenderness.  Lymphadenopathy:    She has no cervical adenopathy.  Neurological: She is alert and oriented to person, place, and time. No cranial nerve deficit. She exhibits normal muscle tone.  Skin: Skin is warm and dry. No rash noted. No erythema. No pallor.  Nursing note and vitals reviewed.   ED Course  Procedures (including critical care time)  DIAGNOSTIC STUDIES: Oxygen Saturation is 99% on RA, normal by my interpretation.    COORDINATION OF CARE: 2:27 AM Will administer fluids, morphine injection, and Zofran injection. Will order CT Abdomen Pelvis w/ Contrast. Discussed treatment plan with pt at bedside and pt agreed to plan.  Labs Review Labs Reviewed  COMPREHENSIVE METABOLIC PANEL - Abnormal; Notable for the following:    Potassium 3.3 (*)    Glucose, Bld 105 (*)    ALT 13 (*)    All other components within normal limits  CBC - Abnormal; Notable for the following:    WBC 10.6 (*)    HCT 35.3 (*)    All other components within normal limits  URINALYSIS, ROUTINE W REFLEX MICROSCOPIC (NOT AT [facility]) - Abnormal; Notable for the following:    Color, Urine AMBER (*)    APPearance TURBID (*)    Ketones, ur 15 (*)    Protein, ur 30 (*)    All other components within normal limits  URINE MICROSCOPIC-ADD ON - Abnormal; Notable for the following:    Squamous Epithelial / LPF TOO NUMEROUS TO COUNT (*)    Bacteria, UA MANY (*)    All other components within normal limits  LIPASE, BLOOD  POC URINE PREG, ED     Imaging Review Ct Abdomen Pelvis W Contrast  12/30/2015  CLINICAL DATA:  30 year old female with diffuse abdominal pain and vomiting EXAM: CT ABDOMEN AND PELVIS WITH CONTRAST TECHNIQUE: Multidetector CT imaging of the abdomen and pelvis was performed using the standard protocol following bolus administration of intravenous contrast. CONTRAST:  156mL OMNIPAQUE IOHEXOL 300 MG/ML  SOLN COMPARISON:  Pelvic ultrasound dated 12/26/2015 FINDINGS: The visualized lung bases are clear. No intra-abdominal free air or free fluid. The liver, gallbladder, pancreas, spleen, adrenal glands, kidneys, visualized ureters, and urinary bladder appear unremarkable. A 1.4 cm right ovarian dominant follicle/cyst. The uterus is anteverted and grossly unremarkable. Evaluation of the bowel is limited in the absence of oral contrast. Multiple decompressed loops of distal small bowel noted. There is no evidence of bowel obstruction or active inflammation. Normal appendix. The abdominal aorta and IVC appear unremarkable. No portal venous gas  identified. There is no adenopathy. The abdominal wall soft tissues appear unremarkable. The osseous structures are intact. IMPRESSION: No acute intra-abdominal or pelvic pathology. Electronically Signed   By: Anner Crete M.D.   On: 12/30/2015 06:28   I have personally reviewed and evaluated these images and lab results as part of my medical decision-making.   EKG Interpretation None      MDM   Final diagnoses:  None   Patient presents emergency department for abdominal pain. She is not a bounce back patient after she received a negative ultrasound last visit. Will obtain CT scan for evaluation. Physical exam is rather unremarkable. She is requesting pain medication she was given morphine and Zofran for her symptoms.  CT scan is neg for acute pathology.  Patient feels better after morphine.  Plan for PCP fu within 3 days. She appears well and in NAD.  VS remainwithin her normal  limits and she is safe for DC.  I personally performed the services described in this documentation, which was scribed in my presence. The recorded information has been reviewed and is accurate.      Everlene Balls, MD 12/30/15 [phone]

## 2017-02-22 ENCOUNTER — Encounter (HOSPITAL_COMMUNITY): Payer: Self-pay | Admitting: *Deleted

## 2017-02-22 ENCOUNTER — Emergency Department (HOSPITAL_COMMUNITY)
Admission: EM | Admit: 2017-02-22 | Discharge: 2017-02-22 | Disposition: A | Discharge status: Home / Self Care | Payer: No Typology Code available for payment source | Attending: Emergency Medicine | Admitting: Emergency Medicine

## 2017-02-22 DIAGNOSIS — Y939 Activity, unspecified: Secondary | ICD-10-CM | POA: Diagnosis not present

## 2017-02-22 DIAGNOSIS — M546 Pain in thoracic spine: Secondary | ICD-10-CM | POA: Diagnosis not present

## 2017-02-22 DIAGNOSIS — M549 Dorsalgia, unspecified: Secondary | ICD-10-CM

## 2017-02-22 DIAGNOSIS — Y92410 Unspecified street and highway as the place of occurrence of the external cause: Secondary | ICD-10-CM | POA: Insufficient documentation

## 2017-02-22 DIAGNOSIS — F17210 Nicotine dependence, cigarettes, uncomplicated: Secondary | ICD-10-CM | POA: Diagnosis not present

## 2017-02-22 DIAGNOSIS — R0789 Other chest pain: Secondary | ICD-10-CM | POA: Diagnosis not present

## 2017-02-22 DIAGNOSIS — Y999 Unspecified external cause status: Secondary | ICD-10-CM | POA: Insufficient documentation

## 2017-02-22 DIAGNOSIS — S4991XA Unspecified injury of right shoulder and upper arm, initial encounter: Secondary | ICD-10-CM | POA: Diagnosis present

## 2017-02-22 DIAGNOSIS — S46911A Strain of unspecified muscle, fascia and tendon at shoulder and upper arm level, right arm, initial encounter: Secondary | ICD-10-CM | POA: Insufficient documentation

## 2017-02-22 MED ORDER — IBUPROFEN 600 MG PO TABS: 600.0000 mg | ORAL_TABLET | Freq: Four times a day (QID) | ORAL | 0 refills | Status: DC | PRN

## 2017-02-22 MED ORDER — METHOCARBAMOL 500 MG PO TABS: 500.0000 mg | ORAL_TABLET | Freq: Every evening | ORAL | 0 refills | Status: DC | PRN
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
## 2017-02-22 NOTE — Discharge Instructions (Signed)
Take Ibuprofen three times daily for the next week. Take this medicine with food. °Take muscle relaxer at bedtime to help you sleep. This medicine makes you drowsy so do not take before driving or work °Use a heating pad for sore muscles - use for 20 minutes several times a day °Return for worsening symptoms ° °
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
## 2017-02-24 ENCOUNTER — Ambulatory Visit (INDEPENDENT_AMBULATORY_CARE_PROVIDER_SITE_OTHER): Payer: BLUE CROSS/BLUE SHIELD | Admitting: Family Medicine

## 2017-02-24 ENCOUNTER — Encounter: Payer: Self-pay | Admitting: Family Medicine

## 2017-02-24 DIAGNOSIS — Z975 Presence of (intrauterine) contraceptive device: Secondary | ICD-10-CM

## 2017-02-24 DIAGNOSIS — D571 Sickle-cell disease without crisis: Secondary | ICD-10-CM

## 2017-02-24 DIAGNOSIS — Z86718 Personal history of other venous thrombosis and embolism: Secondary | ICD-10-CM

## 2017-02-24 DIAGNOSIS — D582 Other hemoglobinopathies: Secondary | ICD-10-CM | POA: Insufficient documentation

## 2017-02-24 HISTORY — DX: Presence of (intrauterine) contraceptive device: Z97.5
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
## 2017-02-24 NOTE — Patient Instructions (Addendum)
     IF you received an x-ray today, you will receive an invoice from [facility]. Please contact [facility] at [phone] with questions or concerns regarding your invoice.   IF you received labwork today, you will receive an invoice from [facility]. Please contact [facility] at [phone] with questions or concerns regarding your invoice.   Our billing staff will not be able to assist you with questions regarding bills from these companies.  You will be contacted with the lab results as soon as they are available. The fastest way to get your results is to activate your My Chart account. Instructions are located on the last page of this paperwork. If you have not heard from [place] regarding the results in 2 weeks, please contact this office.      Muscle Strain A muscle strain is an injury that occurs when a muscle is stretched beyond its normal length. Usually a small number of muscle fibers are torn when this happens. Muscle strain is rated in degrees. First-degree strains have the least amount of muscle fiber tearing and pain. Second-degree and third-degree strains have increasingly more tearing and pain. Usually, recovery from muscle strain takes 1-2 weeks. Complete healing takes 5-6 weeks. What are the causes? Muscle strain happens when a sudden, violent force placed on a muscle stretches it too far. This may occur with lifting, sports, or a fall. What increases the risk? Muscle strain is especially common in athletes. What are the signs or symptoms? At the site of the muscle strain, there may be:  Pain.  Bruising.  Swelling.  Difficulty using the muscle due to pain or lack of normal function. How is this diagnosed? Your health care provider will perform a physical exam and ask about your medical history. How is this treated? Often, the best treatment for a muscle strain is resting, icing, and applying cold compresses to the injured area. Follow these  instructions at home:  Use the PRICE method of treatment to promote muscle healing during the first 2-3 days after your injury. The PRICE method involves:  Protecting the muscle from being injured again.  Restricting your activity and resting the injured body part.  Icing your injury. To do this, put ice in a plastic bag. Place a towel between your skin and the bag. Then, apply the ice and leave it on from 15-20 minutes each hour. After the third day, switch to moist heat packs.  Apply compression to the injured area with a splint or elastic bandage. Be careful not to wrap it too tightly. This may interfere with blood circulation or increase swelling.  Elevate the injured body part above the level of your heart as often as you can.  Only take over-the-counter or prescription medicines for pain, discomfort, or fever as directed by your health care provider.  Warming up prior to exercise helps to prevent future muscle strains. Contact a health care provider if:  You have increasing pain or swelling in the injured area.  You have numbness, tingling, or a significant loss of strength in the injured area. This information is not intended to replace advice given to you by your health care provider. Make sure you discuss any questions you have with your health care provider. Document Released: 09/22/2005 Document Revised: 02/28/2016 Document Reviewed: 04/21/2013 Elsevier Interactive Patient Education  2017 [employer].

## 2017-03-06 ENCOUNTER — Encounter: Payer: BLUE CROSS/BLUE SHIELD | Admitting: Family Medicine

## 2019-05-14 ENCOUNTER — Emergency Department (HOSPITAL_COMMUNITY): Payer: Self-pay

## 2019-05-14 ENCOUNTER — Encounter (HOSPITAL_COMMUNITY): Payer: Self-pay | Admitting: Radiology

## 2019-05-14 ENCOUNTER — Emergency Department (HOSPITAL_COMMUNITY)
Admission: EM | Admit: 2019-05-14 | Discharge: 2019-05-14 | Disposition: A | Discharge status: Home / Self Care | Payer: Self-pay | Attending: Emergency Medicine | Admitting: Emergency Medicine

## 2019-05-14 ENCOUNTER — Other Ambulatory Visit: Payer: Self-pay

## 2019-05-14 DIAGNOSIS — Z87891 Personal history of nicotine dependence: Secondary | ICD-10-CM | POA: Insufficient documentation

## 2019-05-14 DIAGNOSIS — R0789 Other chest pain: Secondary | ICD-10-CM | POA: Insufficient documentation

## 2019-05-14 DIAGNOSIS — Z86718 Personal history of other venous thrombosis and embolism: Secondary | ICD-10-CM | POA: Insufficient documentation

## 2019-05-14 DIAGNOSIS — Z79899 Other long term (current) drug therapy: Secondary | ICD-10-CM | POA: Insufficient documentation

## 2019-05-14 DIAGNOSIS — K209 Esophagitis, unspecified without bleeding: Secondary | ICD-10-CM

## 2019-05-14 DIAGNOSIS — M542 Cervicalgia: Secondary | ICD-10-CM | POA: Insufficient documentation

## 2019-05-14 DIAGNOSIS — D571 Sickle-cell disease without crisis: Secondary | ICD-10-CM | POA: Insufficient documentation

## 2019-05-14 LAB — CBC WITH DIFFERENTIAL/PLATELET
Abs Immature Granulocytes: 0.04 10*3/uL (ref 0.00–0.07)
Basophils Absolute: 0 10*3/uL (ref 0.0–0.1)
Basophils Relative: 1 %
Eosinophils Absolute: 0.2 10*3/uL (ref 0.0–0.5)
Eosinophils Relative: 2 %
HCT: 37.1 % (ref 36.0–46.0)
Hemoglobin: 13.2 g/dL (ref 12.0–15.0)
Immature Granulocytes: 1 %
Lymphocytes Relative: 27 %
Lymphs Abs: 2.3 10*3/uL (ref 0.7–4.0)
MCH: 30 pg (ref 26.0–34.0)
MCHC: 35.6 g/dL (ref 30.0–36.0)
MCV: 84.3 fL (ref 80.0–100.0)
Monocytes Absolute: 0.8 10*3/uL (ref 0.1–1.0)
Monocytes Relative: 9 %
Neutro Abs: 5.2 10*3/uL (ref 1.7–7.7)
Neutrophils Relative %: 60 %
Platelets: 311 10*3/uL (ref 150–400)
RBC: 4.4 MIL/uL (ref 3.87–5.11)
RDW: 13.2 % (ref 11.5–15.5)
WBC: 8.4 10*3/uL (ref 4.0–10.5)
nRBC: 0 % (ref 0.0–0.2)

## 2019-05-14 LAB — TYPE AND SCREEN
ABO/RH(D): A POS
Antibody Screen: NEGATIVE

## 2019-05-14 LAB — APTT: aPTT: 29 seconds (ref 24–36)

## 2019-05-14 LAB — BASIC METABOLIC PANEL
Anion gap: 5 (ref 5–15)
BUN: 15 mg/dL (ref 6–20)
CO2: 24 mmol/L (ref 22–32)
Calcium: 9 mg/dL (ref 8.9–10.3)
Chloride: 110 mmol/L (ref 98–111)
Creatinine, Ser: 0.93 mg/dL (ref 0.44–1.00)
GFR calc Af Amer: >60 mL/min (ref >60)
GFR calc non Af Amer: >60 mL/min (ref >60)
Glucose, Bld: 88 mg/dL (ref 70–99)
Potassium: 4.4 mmol/L (ref 3.5–5.1)
Sodium: 139 mmol/L (ref 135–145)

## 2019-05-14 LAB — PROTIME-INR
INR: 1.3 — ABNORMAL HIGH (ref 0.8–1.2)
Prothrombin Time: 15.7 seconds — ABNORMAL HIGH (ref 11.4–15.2)

## 2019-05-14 LAB — I-STAT BETA HCG BLOOD, ED (MC, WL, AP ONLY): I-stat hCG, quantitative: <5 m[IU]/mL (ref <5)

## 2019-05-14 LAB — I-STAT CREATININE, ED: Creatinine, Ser: 0.9 mg/dL (ref 0.44–1.00)

## 2019-05-14 LAB — ABO/RH: ABO/RH(D): A POS

## 2019-05-14 MED ORDER — IOHEXOL 350 MG/ML SOLN
100.0000 mL | Freq: Once | INTRAVENOUS | Status: AC | PRN
  Administered 2019-05-14: 15:00:00 100 mL via INTRAVENOUS

## 2019-05-14 MED ORDER — PANTOPRAZOLE SODIUM 20 MG PO TBEC: 20.0000 mg | DELAYED_RELEASE_TABLET | Freq: Every day | ORAL | 0 refills | Status: DC
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
## 2019-05-14 NOTE — ED Notes (Signed)
Called reading room to check status of Ct results, doctor still reading pending CT scans.

## 2019-05-14 NOTE — ED Provider Notes (Signed)
[place] EMERGENCY DEPARTMENT Provider Note   CSN: [id number] Arrival date & time: 05/14/19  1223     History   Chief Complaint Chief Complaint  Patient presents with   Mass    Lt side of neck    HPI Joan Knight is a 33 y.o. female.     HPI   33 year old female with left-sided neck pressure and she felt like a mass.  Noticed that she woke up this morning.  She states that symptoms have waxed and waned since then.  Feels tight/pressure.  No shortness of breath.  No change in voice.  Denies any recent trauma or strain.  No history of similar symptoms.  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   OB History   No obstetric history on file.      Home Medications    Prior to Admission medications   Medication Sig Start Date End Date Taking? Authorizing Provider  fluconazole (DIFLUCAN) 150 MG tablet Take 1 tablet (150 mg total) by mouth once. Patient not taking: Reported on 05/14/2019 03/10/14   Lance Bosch, NP  ibuprofen (ADVIL,MOTRIN) 600 MG tablet Take 1 tablet (600 mg total) by mouth every 6 (six) hours as needed. Patient not taking: Reported on 05/14/2019 02/22/17   Recardo Evangelist, PA-C  methocarbamol (ROBAXIN) 500 MG tablet Take 1 tablet (500 mg total) by mouth at bedtime and may repeat dose one time if needed. Patient not taking: Reported on 05/14/2019 02/22/17   Recardo Evangelist, PA-C  metroNIDAZOLE (FLAGYL) 500 MG tablet Take 1 tablet (500 mg total) by mouth 2 (two) times daily. Patient not taking: Reported on 05/14/2019 12/26/15   Shary Decamp, PA-C  metroNIDAZOLE (METROGEL VAGINAL) 0.75 % vaginal gel Place 1 Applicatorful  vaginally 2 (two) times daily. Patient not taking: Reported on 05/14/2019 08/25/14   Tresa Garter, MD  nystatin-triamcinolone ointment [facility]) Apply 1 application topically 2 (two) times daily. Apply under breast Patient not taking: Reported on 05/14/2019 07/14/14   Lance Bosch, NP  omeprazole (PRILOSEC) 20 MG capsule Take 1 capsule (20 mg total) by mouth daily. 30 minutes before meal Patient not taking: Reported on 05/14/2019 03/07/14   Chari Manning A, NP  polyethylene glycol powder (GLYCOLAX/MIRALAX) powder Take 17 g by mouth daily. Patient not taking: Reported on 05/14/2019 03/07/14   Lance Bosch, NP  triamcinolone cream (KENALOG) 0.1 % Apply 1 application topically 2 (two) times daily. Do not apply to face Patient not taking: Reported on 05/14/2019 03/07/14   Lance Bosch, NP    Family History No family history on file.  Social History Social History   Tobacco Use   Smoking status: Former Smoker    Packs/day: 0.50    Types: Cigarettes    Quit date: 08/27/2016    Years since quitting: 2.7   Smokeless tobacco: Never Used  Substance Use Topics   Alcohol use: No   Drug use: No     Allergies   Patient has no known allergies.   Review of Systems Review of Systems  All systems reviewed and negative, other than  as noted in HPI.  Physical Exam Updated Vital Signs BP 109/65    Pulse 61    Temp 98.3 F (36.8 C) (Oral)    Resp 20    Ht 5' (1.524 m)    LMP 05/01/2019    SpO2 99%    BMI 28.90 kg/m   Physical Exam Vitals signs and nursing note reviewed.  Constitutional:      General: She is not in acute distress.    Appearance: She is well-developed. She is obese.  HENT:     Head: Normocephalic and atraumatic.  Eyes:     General:        Right eye: No discharge.        Left eye: No discharge.     Conjunctiva/sclera: Conjunctivae normal.  Neck:     Musculoskeletal: Neck supple.     Comments: Neck is grossly normal in appearance to me.  I appreciate a mass.  No  adenopathy.  No tenderness.  Neck is supple.  Normal sounding voice.  No stridor.  Oropharynx is clear. Cardiovascular:     Rate and Rhythm: Normal rate and regular rhythm.     Heart sounds: Normal heart sounds. No murmur. No friction rub. No gallop.   Pulmonary:     Effort: Pulmonary effort is normal. No respiratory distress.     Breath sounds: Normal breath sounds.  Abdominal:     General: There is no distension.     Palpations: Abdomen is soft.     Tenderness: There is no abdominal tenderness.  Musculoskeletal:        General: No tenderness.  Skin:    General: Skin is warm and dry.  Neurological:     Mental Status: She is alert.  Psychiatric:        Behavior: Behavior normal.        Thought Content: Thought content normal.      ED Treatments / Results  Labs (all labs ordered are listed, but only abnormal results are displayed) Labs Reviewed  PROTIME-INR - Abnormal; Notable for the following components:      Result Value   Prothrombin Time 15.7 (*)    INR 1.3 (*)    All other components within normal limits  CBC WITH DIFFERENTIAL/PLATELET  BASIC METABOLIC PANEL  APTT  I-STAT BETA HCG BLOOD, ED ([facility], [facility], [facility] ONLY)  I-STAT CREATININE, ED  TYPE AND SCREEN  ABO/RH    EKG EKG Interpretation  Date/Time:  Saturday May 14 2019 14:02:11 EDT Ventricular Rate:  72 PR Interval:    QRS Duration: 72 QT Interval:  392 QTC Calculation: 429 R Axis:   47 Text Interpretation:  Sinus rhythm Confirmed by Virgel Manifold [phone]) on 05/14/2019 4:02:01 PM   Radiology No results found.   Ct Angio Head W Or Wo Contrast  Result Date: 05/14/2019 CLINICAL DATA:  Initial evaluation for acute neck pain and chest pain, dizziness. EXAM: CT ANGIOGRAPHY HEAD AND NECK TECHNIQUE: Multidetector CT imaging of the head and neck was performed using the standard protocol during bolus administration of intravenous contrast. Multiplanar CT image reconstructions and MIPs were obtained to evaluate the  vascular anatomy. Carotid stenosis measurements (when applicable) are obtained utilizing NASCET criteria, using the distal internal carotid diameter as the denominator. CONTRAST:  167mL OMNIPAQUE IOHEXOL 350 MG/ML SOLN COMPARISON:  None available. FINDINGS: CT HEAD FINDINGS Brain: Cerebral volume within normal limits for patient age. No evidence for acute intracranial hemorrhage. No findings to suggest acute large vessel territory infarct. No  mass lesion, midline shift, or mass effect. Ventricles are normal in size without evidence for hydrocephalus. No extra-axial fluid collection identified. Vascular: No hyperdense vessel identified. Skull: Scalp soft tissues demonstrate no acute abnormality. Calvarium intact. Sinuses/Orbits: Globes and orbital soft tissues within normal limits. Visualized paranasal sinuses are clear. No mastoid effusion. CTA NECK FINDINGS Aortic arch: Visualized aortic arch of normal caliber with normal branch pattern. No hemodynamically significant stenosis seen about the origin of the great vessels. Visualized subclavian arteries widely patent. Right carotid system: Right common and internal carotid arteries widely patent without stenosis, dissection, or occlusion. No atheromatous narrowing about the right carotid bifurcation. Left carotid system: Left common and internal carotid arteries widely patent without stenosis, dissection, or occlusion. No atheromatous narrowing about the left carotid bifurcation. Vertebral arteries: Both vertebral arteries arise from the subclavian arteries. Vertebral arteries widely patent within the neck without stenosis, dissection or occlusion. Skeleton: No acute osseous abnormality. No discrete lytic or blastic osseous lesions. Other neck: No other acute soft tissue abnormality within the neck. Upper chest: Visualized upper chest demonstrates no acute finding. Review of the MIP images confirms the above findings CTA HEAD FINDINGS Anterior circulation: Evaluation  of the intracranial circulation limited by motion artifact. Internal carotid arteries widely patent to the termini without stenosis or other abnormality. A1 segments patent bilaterally. Grossly normal anterior communicating artery complex. Anterior cerebral arteries grossly patent to their distal aspects without stenosis or occlusion. No M1 stenosis. Grossly negative MCA bifurcations. Distal MCA branches well perfused and symmetric. Posterior circulation: Vertebral arteries widely patent to the vertebrobasilar junction without stenosis. Posteroinferior cerebral arteries patent bilaterally. Basilar widely patent to its distal aspect without stenosis. Superior cerebellar and posterior cerebral arteries widely patent bilaterally. Venous sinuses: Grossly patent allowing for timing of the contrast bolus and motion artifact. Anatomic variants: None significant.  No intracranial aneurysm. Review of the MIP images confirms the above findings IMPRESSION: 1. Normal CTA of the head and neck. No large vessel occlusion, dissection, or other acute vascular abnormality identified. No aneurysm. 2. No other acute intracranial abnormality identified. Electronically Signed   By: Jeannine Boga M.D.   On: 05/14/2019 16:48   Ct Angio Neck W And/or Wo Contrast  Result Date: 05/14/2019 CLINICAL DATA:  Initial evaluation for acute neck pain and chest pain, dizziness. EXAM: CT ANGIOGRAPHY HEAD AND NECK TECHNIQUE: Multidetector CT imaging of the head and neck was performed using the standard protocol during bolus administration of intravenous contrast. Multiplanar CT image reconstructions and MIPs were obtained to evaluate the vascular anatomy. Carotid stenosis measurements (when applicable) are obtained utilizing NASCET criteria, using the distal internal carotid diameter as the denominator. CONTRAST:  134mL OMNIPAQUE IOHEXOL 350 MG/ML SOLN COMPARISON:  None available. FINDINGS: CT HEAD FINDINGS Brain: Cerebral volume within normal  limits for patient age. No evidence for acute intracranial hemorrhage. No findings to suggest acute large vessel territory infarct. No mass lesion, midline shift, or mass effect. Ventricles are normal in size without evidence for hydrocephalus. No extra-axial fluid collection identified. Vascular: No hyperdense vessel identified. Skull: Scalp soft tissues demonstrate no acute abnormality. Calvarium intact. Sinuses/Orbits: Globes and orbital soft tissues within normal limits. Visualized paranasal sinuses are clear. No mastoid effusion. CTA NECK FINDINGS Aortic arch: Visualized aortic arch of normal caliber with normal branch pattern. No hemodynamically significant stenosis seen about the origin of the great vessels. Visualized subclavian arteries widely patent. Right carotid system: Right common and internal carotid arteries widely patent without stenosis, dissection, or occlusion. No atheromatous  narrowing about the right carotid bifurcation. Left carotid system: Left common and internal carotid arteries widely patent without stenosis, dissection, or occlusion. No atheromatous narrowing about the left carotid bifurcation. Vertebral arteries: Both vertebral arteries arise from the subclavian arteries. Vertebral arteries widely patent within the neck without stenosis, dissection or occlusion. Skeleton: No acute osseous abnormality. No discrete lytic or blastic osseous lesions. Other neck: No other acute soft tissue abnormality within the neck. Upper chest: Visualized upper chest demonstrates no acute finding. Review of the MIP images confirms the above findings CTA HEAD FINDINGS Anterior circulation: Evaluation of the intracranial circulation limited by motion artifact. Internal carotid arteries widely patent to the termini without stenosis or other abnormality. A1 segments patent bilaterally. Grossly normal anterior communicating artery complex. Anterior cerebral arteries grossly patent to their distal aspects  without stenosis or occlusion. No M1 stenosis. Grossly negative MCA bifurcations. Distal MCA branches well perfused and symmetric. Posterior circulation: Vertebral arteries widely patent to the vertebrobasilar junction without stenosis. Posteroinferior cerebral arteries patent bilaterally. Basilar widely patent to its distal aspect without stenosis. Superior cerebellar and posterior cerebral arteries widely patent bilaterally. Venous sinuses: Grossly patent allowing for timing of the contrast bolus and motion artifact. Anatomic variants: None significant.  No intracranial aneurysm. Review of the MIP images confirms the above findings IMPRESSION: 1. Normal CTA of the head and neck. No large vessel occlusion, dissection, or other acute vascular abnormality identified. No aneurysm. 2. No other acute intracranial abnormality identified. Electronically Signed   By: Jeannine Boga M.D.   On: 05/14/2019 16:48   Ct Angio Chest Aorta W And/or Wo Contrast  Result Date: 05/14/2019 CLINICAL DATA:  Chest pain and neck pain. Dizziness earlier today. EXAM: CT ANGIOGRAPHY CHEST WITH CONTRAST TECHNIQUE: Multidetector CT imaging of the chest was performed using the standard protocol during bolus administration of intravenous contrast. Multiplanar CT image reconstructions and MIPs were obtained to evaluate the vascular anatomy. CONTRAST:  127mL OMNIPAQUE IOHEXOL 350 MG/ML SOLN COMPARISON:  None. FINDINGS: Cardiovascular: Satisfactory opacification of the pulmonary arteries to the segmental level. No evidence of pulmonary embolism. Normal heart size. No pericardial effusion. Mediastinum/Nodes: No enlarged mediastinal, hilar, or axillary lymph nodes. Thyroid gland, and trachea demonstrate no significant findings. Mild diffuse circumferential thickening of the esophagus. Lungs/Pleura: Lungs are clear. No pleural effusion or pneumothorax. Upper Abdomen: No acute abnormality. Musculoskeletal: No chest wall abnormality. No acute or  significant osseous findings. Review of the MIP images confirms the above findings. IMPRESSION: 1. No evidence of pulmonary embolus. 2. Mild diffuse circumferential thickening of the esophagus, which may represent esophagitis or GERD. Electronically Signed   By: Fidela Salisbury M.D.   On: 05/14/2019 15:33     Procedures Procedures (including critical care time)  Medications Ordered in ED Medications  iohexol (OMNIPAQUE) 350 MG/ML injection 100 mL (100 mLs Intravenous Contrast Given 05/14/19 1451)     Initial Impression / Assessment and Plan / ED Course  I have reviewed the triage vital signs and the nursing notes.  Pertinent labs & imaging results that were available during my care of the patient were reviewed by me and considered in my medical decision making (see chart for details).  Clinical Course as of May 13 1548  Sat May 14, 2019  1329 Spoke with charge nurse Roselyn Reef who says that she will find a room in the acute side for patient to go to.   [EH]    Clinical Course User Index [EH] Lorin Glass, PA-C  33yF with atraumatic L neck swelling. She is obese which limits exam, but I did not really appreciate any mass/swelling on my assessment. She reports symptoms wax/wane. Oropharynx clear. Location she reports symptoms is lower than I would expect from for parotitis or submandibular gland process. I'm not worried about her airway. I feel bilateral carotid pulses. Neck is supple.   Imaging pending. If negative, I think she is fine for discharge. I advised her to take her xarelto regularly as prescribed. Will also try protonix. Esophagitis noticed on CT chest and this may potentially be causing her discomfort into her neck.   Final Clinical Impressions(s) / ED Diagnoses   Final diagnoses:  Neck discomfort  Esophagitis    ED Discharge Orders    None       Virgel Manifold, MD 05/19/19 1013

## 2019-05-14 NOTE — ED Triage Notes (Signed)
Pt called EMS because of pulsating mass on Lt side of neck. Pt reports she has had blood clot in the past. Pt is noncompliant with xarelto  But took a dose this AM and the mass appeared on Lt side of neck. Pt denies any pain.

## 2019-05-14 NOTE — Discharge Instructions (Addendum)
Take xarelto as prescribed. Your CT scan showed esophagitis (inflammation of your esophagus). This is often caused by acid reflux. It could potentially be the cause of your neck discomfort. You are being prescribed protonix which is a medication used to treat this condition.

## 2019-05-14 NOTE — ED Notes (Signed)
ED Provider at bedside. 

## 2019-05-14 NOTE — ED Provider Notes (Signed)
MSE was initiated and I personally evaluated the patient and placed orders (if any) at  1:38 PM on May 14, 2019.  The patient appears stable so that the remainder of the MSE may be completed by another provider.  Patient reports that she woke up this morning with a large, reportedly pulsating mass on the left side of her neck.  She states that she has had a blood clot in the past and is supposed to be on Xarelto.  She states that it felt like she had a blood clot in her neck.  She took her dose of Xarelto this morning.  She reports that she feels like she has a pressure in her chest in addition.  She denies sharp pain however states pressure in both the left side of her neck and her chest.    I placed orders, asked RN to get her IV and labs started and asked charge to move patient to a more appropriate bed, concern for extensive thrombus vs dissection.  She is grossly   1413: Patient moved into trauma C. Verbal report given to [facility] pod doctor.    Lorin Glass, [place] 05/14/19 1415    Virgel Manifold, MD 05/18/19 1455

## 2019-07-24 ENCOUNTER — Emergency Department (HOSPITAL_COMMUNITY): Payer: Self-pay

## 2019-07-24 ENCOUNTER — Encounter (HOSPITAL_COMMUNITY): Payer: Self-pay | Admitting: Emergency Medicine

## 2019-07-24 ENCOUNTER — Other Ambulatory Visit: Payer: Self-pay

## 2019-07-24 ENCOUNTER — Emergency Department (HOSPITAL_COMMUNITY)
Admission: EM | Admit: 2019-07-24 | Discharge: 2019-07-24 | Disposition: A | Discharge status: Home / Self Care | Payer: Self-pay | Attending: Emergency Medicine | Admitting: Emergency Medicine

## 2019-07-24 DIAGNOSIS — K5900 Constipation, unspecified: Secondary | ICD-10-CM | POA: Insufficient documentation

## 2019-07-24 DIAGNOSIS — Z87891 Personal history of nicotine dependence: Secondary | ICD-10-CM | POA: Insufficient documentation

## 2019-07-24 DIAGNOSIS — R1084 Generalized abdominal pain: Secondary | ICD-10-CM

## 2019-07-24 DIAGNOSIS — Z79899 Other long term (current) drug therapy: Secondary | ICD-10-CM | POA: Insufficient documentation

## 2019-07-24 LAB — COMPREHENSIVE METABOLIC PANEL
ALT: 14 U/L (ref 0–44)
AST: 20 U/L (ref 15–41)
Albumin: 4.2 g/dL (ref 3.5–5.0)
Alkaline Phosphatase: 43 U/L (ref 38–126)
Anion gap: 7 (ref 5–15)
BUN: 14 mg/dL (ref 6–20)
CO2: 24 mmol/L (ref 22–32)
Calcium: 9.2 mg/dL (ref 8.9–10.3)
Chloride: 107 mmol/L (ref 98–111)
Creatinine, Ser: 0.93 mg/dL (ref 0.44–1.00)
GFR calc Af Amer: >60 mL/min (ref >60)
GFR calc non Af Amer: >60 mL/min (ref >60)
Glucose, Bld: 89 mg/dL (ref 70–99)
Potassium: 3.8 mmol/L (ref 3.5–5.1)
Sodium: 138 mmol/L (ref 135–145)
Total Bilirubin: 0.3 mg/dL (ref 0.3–1.2)
Total Protein: 7.3 g/dL (ref 6.5–8.1)

## 2019-07-24 LAB — URINALYSIS, ROUTINE W REFLEX MICROSCOPIC
Bacteria, UA: NONE SEEN
Bilirubin Urine: NEGATIVE
Glucose, UA: NEGATIVE mg/dL
Hgb urine dipstick: NEGATIVE
Ketones, ur: 5 mg/dL — AB
Nitrite: NEGATIVE
Protein, ur: NEGATIVE mg/dL
Specific Gravity, Urine: 1.028 (ref 1.005–1.030)
pH: 6 (ref 5.0–8.0)

## 2019-07-24 LAB — CBC
HCT: 36.9 % (ref 36.0–46.0)
Hemoglobin: 13.1 g/dL (ref 12.0–15.0)
MCH: 30.1 pg (ref 26.0–34.0)
MCHC: 35.5 g/dL (ref 30.0–36.0)
MCV: 84.8 fL (ref 80.0–100.0)
Platelets: 308 10*3/uL (ref 150–400)
RBC: 4.35 MIL/uL (ref 3.87–5.11)
RDW: 13.4 % (ref 11.5–15.5)
WBC: 6.7 10*3/uL (ref 4.0–10.5)
nRBC: 0 % (ref 0.0–0.2)

## 2019-07-24 LAB — I-STAT BETA HCG BLOOD, ED (MC, WL, AP ONLY): I-stat hCG, quantitative: <5 m[IU]/mL (ref <5)

## 2019-07-24 LAB — MONONUCLEOSIS SCREEN: Mono Screen: NEGATIVE

## 2019-07-24 LAB — GROUP A STREP BY PCR: Group A Strep by PCR: NOT DETECTED

## 2019-07-24 LAB — TROPONIN I (HIGH SENSITIVITY)
Troponin I (High Sensitivity): <2 ng/L (ref <18)
Troponin I (High Sensitivity): <2 ng/L (ref <18)

## 2019-07-24 LAB — LIPASE, BLOOD: Lipase: 21 U/L (ref 11–51)

## 2019-07-24 MED ORDER — SODIUM CHLORIDE (PF) 0.9 % IJ SOLN
INTRAMUSCULAR | Status: AC
  Filled 2019-07-24: qty 50

## 2019-07-24 MED ORDER — POLYETHYLENE GLYCOL 3350 17 G PO PACK: 17.0000 g | PACK | Freq: Every day | ORAL | 0 refills | Status: DC

## 2019-07-24 MED ORDER — IOHEXOL 350 MG/ML SOLN
100.0000 mL | Freq: Once | INTRAVENOUS | Status: AC | PRN
  Administered 2019-07-24: 100 mL via INTRAVENOUS

## 2019-07-24 MED ORDER — MORPHINE SULFATE (PF) 4 MG/ML IV SOLN
4.0000 mg | Freq: Once | INTRAVENOUS | Status: AC
  Administered 2019-07-24: 4 mg via INTRAVENOUS
  Filled 2019-07-24: qty 1

## 2019-07-24 NOTE — Discharge Instructions (Signed)
Your work-up today was overall reassuring.  You do appear to have a large amount of stool likely related to the constipation contributing abdominal pain.  Please drink lots of fluids to help hydrate and use the MiraLAX as we discussed.  You may also use over-the-counter medications to help with the pain.  Please rest.  Please follow-up with a primary doctor.  Your other work-up only revealed the small ovarian cyst on the right side, please follow-up with your OB/GYN for this.  If any symptoms change or worsen, please return to the nearest emergency department.

## 2019-07-24 NOTE — ED Triage Notes (Signed)
Per pt, states left upper abdominal swelling and pain- left lymph node swollen for 1 month-both occurred at the same time

## 2019-07-24 NOTE — ED Notes (Signed)
Pt to CT scan.

## 2019-07-24 NOTE — ED Provider Notes (Signed)
[facility] DEPT Provider Note   CSN: [id number] Arrival date & time: 07/24/19  1617     History   Chief Complaint Chief Complaint  Patient presents with   Abdominal Pain    HPI Joan Knight is a 33 y.o. female.     The history is provided by the patient. No language interpreter was used.  Abdominal Pain Pain location:  LUQ Pain quality: aching and bloating   Pain radiates to:  Chest Pain severity:  Severe Onset quality:  Gradual Duration:  4 weeks Timing:  Constant Progression:  Worsening Chronicity:  New Context: not previous surgeries and not trauma   Relieved by:  Nothing Worsened by:  Nothing Associated symptoms: chest pain, constipation, fatigue and sore throat   Associated symptoms: no chills, no cough, no diarrhea, no dysuria, no fever, no flatus, no nausea, no shortness of breath and no vomiting     Past Medical History:  Diagnosis Date   DVT (deep venous thrombosis) (HCC)    Sickle cell anemia (HCC)    Sickle cell trait [facility])     Patient Active Problem List   Diagnosis Date Noted   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 No pertinent surgical history.   OB History   No obstetric history on file.      Home Medications    Prior to Admission medications   Medication Sig Start Date End Date Taking? Authorizing Provider  fluconazole (DIFLUCAN) 150 MG tablet Take 1 tablet (150 mg total) by mouth once. Patient not taking: Reported on 05/14/2019 03/10/14   Lance Bosch, NP  ibuprofen (ADVIL,MOTRIN) 600 MG tablet Take 1 tablet (600 mg total) by mouth every 6 (six) hours as needed. Patient not taking: Reported on 05/14/2019 02/22/17   Recardo Evangelist, PA-C  methocarbamol (ROBAXIN) 500 MG tablet Take 1 tablet (500 mg total) by mouth at bedtime and may repeat dose one time if needed. Patient not taking:  Reported on 05/14/2019 02/22/17   Recardo Evangelist, PA-C  metroNIDAZOLE (FLAGYL) 500 MG tablet Take 1 tablet (500 mg total) by mouth 2 (two) times daily. Patient not taking: Reported on 05/14/2019 12/26/15   Shary Decamp, PA-C  metroNIDAZOLE (METROGEL VAGINAL) 0.75 % vaginal gel Place 1 Applicatorful vaginally 2 (two) times daily. Patient not taking: Reported on 05/14/2019 08/25/14   Tresa Garter, MD  nystatin-triamcinolone ointment [facility]) Apply 1 application topically 2 (two) times daily. Apply under breast Patient not taking: Reported on 05/14/2019 07/14/14   Lance Bosch, NP  omeprazole (PRILOSEC) 20 MG capsule Take 1 capsule (20 mg total) by mouth daily. 30 minutes before meal Patient not taking: Reported on 05/14/2019 03/07/14   Lance Bosch, NP  pantoprazole (PROTONIX) 20 MG tablet Take 1 tablet (20 mg total) by mouth daily. 05/14/19   Virgel Manifold, MD  polyethylene glycol powder (GLYCOLAX/MIRALAX) powder Take 17 g by mouth daily. Patient not taking: Reported on 05/14/2019 03/07/14   Lance Bosch, NP  triamcinolone cream (KENALOG) 0.1 % Apply 1 application topically 2 (two) times daily. Do not apply to face Patient not taking: Reported on 05/14/2019 03/07/14   Lance Bosch, NP    Family History No family history on file.  Social History Social History   Tobacco Use   Smoking status: Former Smoker    Packs/day: 0.50    Types: Cigarettes  Quit date: 08/27/2016    Years since quitting: 2.9   Smokeless tobacco: Never Used  Substance Use Topics   Alcohol use: No   Drug use: No     Allergies   Patient has no known allergies.   Review of Systems Review of Systems  Constitutional: Positive for fatigue. Negative for chills, diaphoresis and fever.  HENT: Positive for sore throat. Negative for congestion.   Eyes: Negative for visual disturbance.  Respiratory: Negative for cough and shortness of breath.   Cardiovascular: Positive for chest pain. Negative for palpitations  and leg swelling.  Gastrointestinal: Positive for abdominal distention, abdominal pain and constipation. Negative for diarrhea, flatus, nausea and vomiting.  Genitourinary: Positive for flank pain. Negative for dysuria and frequency.  Musculoskeletal: Negative for back pain, neck pain and neck stiffness.  Skin: Negative for rash and wound.  Neurological: Negative for dizziness, light-headedness and headaches.  Psychiatric/Behavioral: Negative for agitation.  All other systems reviewed and are negative.    Physical Exam Updated Vital Signs BP 120/78 (BP Location: Left Arm)    Pulse 67    Temp 98.4 F (36.9 C) (Oral)    Resp 18    SpO2 99%   Physical Exam Vitals signs and nursing note reviewed.  Constitutional:      General: She is not in acute distress.    Appearance: She is well-developed. She is not ill-appearing, toxic-appearing or diaphoretic.  HENT:     Head: Normocephalic and atraumatic.     Right Ear: External ear normal.     Left Ear: External ear normal.     Nose: Nose normal.     Mouth/Throat:     Mouth: Mucous membranes are moist.     Pharynx: Oropharynx is clear. No pharyngeal swelling or oropharyngeal exudate.  Eyes:     Extraocular Movements: Extraocular movements intact.     Conjunctiva/sclera: Conjunctivae normal.     Pupils: Pupils are equal, round, and reactive to light.  Neck:     Musculoskeletal: Normal range of motion and neck supple.  Cardiovascular:     Rate and Rhythm: Normal rate.     Heart sounds: Normal heart sounds. No murmur.  Pulmonary:     Effort: Pulmonary effort is normal. No respiratory distress.     Breath sounds: No stridor. No wheezing, rhonchi or rales.  Chest:     Chest wall: Tenderness present.    Abdominal:     General: Abdomen is flat. Bowel sounds are normal. There are no signs of injury.     Tenderness: There is abdominal tenderness in the epigastric area and left upper quadrant. There is no right CVA tenderness, left CVA  tenderness, guarding or rebound.    Skin:    General: Skin is warm.     Findings: No erythema or rash.  Neurological:     General: No focal deficit present.     Mental Status: She is alert and oriented to person, place, and time.     Motor: No abnormal muscle tone.     Coordination: Coordination normal.     Deep Tendon Reflexes: Reflexes are normal and symmetric.  Psychiatric:        Mood and Affect: Mood normal.      ED Treatments / Results  Labs (all labs ordered are listed, but only abnormal results are displayed) Labs Reviewed  URINALYSIS, ROUTINE W REFLEX MICROSCOPIC - Abnormal; Notable for the following components:      Result Value   Ketones, ur 5 (*)  Leukocytes,Ua TRACE (*)    All other components within normal limits  GROUP A STREP BY PCR  LIPASE, BLOOD  COMPREHENSIVE METABOLIC PANEL  CBC  MONONUCLEOSIS SCREEN  I-STAT BETA HCG BLOOD, ED ([facility], [facility], [facility] ONLY)  TROPONIN I (HIGH SENSITIVITY)  TROPONIN I (HIGH SENSITIVITY)    EKG EKG Interpretation  Date/Time:  Sunday July 24 2019 20:45:47 EDT Ventricular Rate:  80 PR Interval:    QRS Duration: 90 QT Interval:  381 QTC Calculation: 440 R Axis:   43 Text Interpretation:  Sinus rhythm When compared to prior, no significant changes seen.  No STEMI Confirmed by Antony Blackbird [phone]) on 07/24/2019 9:14:57 PM   Radiology Ct Angio Chest Pe W And/or Wo Contrast  Result Date: 07/24/2019 CLINICAL DATA:  Chest pain, history of DVT and abdominal pain EXAM: CT ANGIOGRAPHY CHEST WITH CONTRAST TECHNIQUE: Multidetector CT imaging of the chest was performed using the standard protocol during bolus administration of intravenous contrast. Multiplanar CT image reconstructions and MIPs were obtained to evaluate the vascular anatomy. CONTRAST:  154mL OMNIPAQUE IOHEXOL 350 MG/ML SOLN COMPARISON:  May 14, 2019 FINDINGS: Cardiovascular: There is a optimal opacification of the pulmonary arteries. There is no central,segmental,  or subsegmental filling defects within the pulmonary arteries. The heart is normal in size. No pericardial effusion thickening. No evidence right heart strain. There is normal three-vessel brachiocephalic anatomy without proximal stenosis. The thoracic aorta is normal in appearance. Mediastinum/Nodes: No hilar, mediastinal, or axillary adenopathy. Thyroid gland, trachea, and esophagus demonstrate no significant findings. Lungs/Pleura: The lungs are clear. No pleural effusion or pneumothorax. No airspace consolidation. Upper Abdomen: No acute abnormalities present in the visualized portions of the upper abdomen. Musculoskeletal: No chest wall abnormality. No acute or significant osseous findings. Review of the MIP images confirms the above findings. Abdomen/pelvis: Lower chest: The visualized heart size within normal limits. No pericardial fluid/thickening. No hiatal hernia. The visualized portions of the lungs are clear. Hepatobiliary: Again noted are tiny hypodense lesion seen within the right hepatic dome, likely hepatic cyst the main portal vein is patent. No evidence of calcified gallstones, gallbladder wall thickening or biliary dilatation. Pancreas: Unremarkable. No pancreatic ductal dilatation or surrounding inflammatory changes. Spleen: Normal in size without focal abnormality. Adrenals/Urinary Tract: Both adrenal glands appear normal. The kidneys and collecting system appear normal without evidence of urinary tract calculus or hydronephrosis. Bladder is unremarkable. Stomach/Bowel: The stomach, small bowel, and colon are normal in appearance. No inflammatory changes, wall thickening, or obstructive findings.The appendix is normal. Vascular/Lymphatic: There are no enlarged mesenteric, retroperitoneal, or pelvic lymph nodes. No significant vascular findings are present. Reproductive: Within the right adnexa there is a 3 cm low-density lesion, likely ovarian/paraovarian cyst. A small amount of free fluid seen  within the cul-de-sac. Other: No evidence of abdominal wall mass or hernia. Musculoskeletal: No acute or significant osseous findings. IMPRESSION: No central, segmental, or subsegmental pulmonary embolism. 3 cm cyst within the right adnexa, likely ovarian/paraovarian cyst. No other acute intra-abdominal or pelvic pathology. Electronically Signed   By: Prudencio Pair M.D.   On: 07/24/2019 22:34   Ct Abdomen Pelvis W Contrast  Result Date: 07/24/2019 CLINICAL DATA:  Chest pain, history of DVT and abdominal pain EXAM: CT ANGIOGRAPHY CHEST WITH CONTRAST TECHNIQUE: Multidetector CT imaging of the chest was performed using the standard protocol during bolus administration of intravenous contrast. Multiplanar CT image reconstructions and MIPs were obtained to evaluate the vascular anatomy. CONTRAST:  177mL OMNIPAQUE IOHEXOL 350 MG/ML SOLN COMPARISON:  May 14, 2019 FINDINGS: Cardiovascular: There is a optimal opacification of the pulmonary arteries. There is no central,segmental, or subsegmental filling defects within the pulmonary arteries. The heart is normal in size. No pericardial effusion thickening. No evidence right heart strain. There is normal three-vessel brachiocephalic anatomy without proximal stenosis. The thoracic aorta is normal in appearance. Mediastinum/Nodes: No hilar, mediastinal, or axillary adenopathy. Thyroid gland, trachea, and esophagus demonstrate no significant findings. Lungs/Pleura: The lungs are clear. No pleural effusion or pneumothorax. No airspace consolidation. Upper Abdomen: No acute abnormalities present in the visualized portions of the upper abdomen. Musculoskeletal: No chest wall abnormality. No acute or significant osseous findings. Review of the MIP images confirms the above findings. Abdomen/pelvis: Lower chest: The visualized heart size within normal limits. No pericardial fluid/thickening. No hiatal hernia. The visualized portions of the lungs are clear. Hepatobiliary: Again  noted are tiny hypodense lesion seen within the right hepatic dome, likely hepatic cyst the main portal vein is patent. No evidence of calcified gallstones, gallbladder wall thickening or biliary dilatation. Pancreas: Unremarkable. No pancreatic ductal dilatation or surrounding inflammatory changes. Spleen: Normal in size without focal abnormality. Adrenals/Urinary Tract: Both adrenal glands appear normal. The kidneys and collecting system appear normal without evidence of urinary tract calculus or hydronephrosis. Bladder is unremarkable. Stomach/Bowel: The stomach, small bowel, and colon are normal in appearance. No inflammatory changes, wall thickening, or obstructive findings.The appendix is normal. Vascular/Lymphatic: There are no enlarged mesenteric, retroperitoneal, or pelvic lymph nodes. No significant vascular findings are present. Reproductive: Within the right adnexa there is a 3 cm low-density lesion, likely ovarian/paraovarian cyst. A small amount of free fluid seen within the cul-de-sac. Other: No evidence of abdominal wall mass or hernia. Musculoskeletal: No acute or significant osseous findings. IMPRESSION: No central, segmental, or subsegmental pulmonary embolism. 3 cm cyst within the right adnexa, likely ovarian/paraovarian cyst. No other acute intra-abdominal or pelvic pathology. Electronically Signed   By: Prudencio Pair M.D.   On: 07/24/2019 22:34    Procedures Procedures (including critical care time)  Medications Ordered in ED Medications  sodium chloride (PF) 0.9 % injection (  Canceled Entry 07/24/19 2237)  morphine 4 MG/ML injection 4 mg (4 mg Intravenous Given 07/24/19 2114)  iohexol (OMNIPAQUE) 350 MG/ML injection 100 mL (100 mLs Intravenous Contrast Given 07/24/19 2201)     Initial Impression / Assessment and Plan / ED Course  I have reviewed the triage vital signs and the nursing notes.  Pertinent labs & imaging results that were available during my care of the patient  were reviewed by me and considered in my medical decision making (see chart for details).        Joan Knight is a 33 y.o. female with a past medical history significant for prior DVT not on anticoagulation and sickle cell trait who presents with left abdominal pain, left lower chest discomfort, left lymph node, fatigue, and sore throat.  Patient reports that her symptoms been ongoing for the last month.  She reports that she initially was told she likely had a viral URI contributing to her lymph node.  She reports that over the last week she started having worsened left-sided abdominal pain and feels it is distended.  She reports he is also not had a bowel movement in a week and is not passing gas now.  She denies any urinary changes.  She reports that she did sounds good going and is very concerned about the bump on her neck is a supraclavicular node that she may  have a lung cancer or lymphoma.  She does report she smokes tobacco.  She denies any cough or shortness of breath but does report the pain in her left upper abdomen actually goes into her chest as well.  She denies recent trauma.  She denies difficulty swallowing at this time but reports she was having some sore throat and pain with swallowing when she initially had symptoms.  She denies current URI symptoms.  Denies any Covid exposure.  Denies any significant back pain.  Does report some left flank pain with her discomfort in the abdomen.  On exam, patient's left abdomen and left lower chest is tender to palpation.  Bowel sounds were appreciated.  Patient had no CVA tenderness or rash seen no evidence of shingles.  Lungs were clear.  Patient did have a palpable left supraclavicular node versus low anterior chain node.  No stridor.  Normal neck range of motion.  Oropharynx unremarkable.  No focal neurologic deficits.  Clinically I am concerned that patient may have mono causing the lymphadenopathy and possible spleen area pain.  However with  her decreased bowel movement and decreased flatus, the reported distention, will get imaging to rule out a bowel obstruction.  Due to the patient's torso pain with the supraclavicular node, will get CT scan to further evaluate the chest with her history of DVT not being on anticoagulation.  We will get screening labs as well as strep and mono test.  Anticipate reassessment for work-up and patient given pain medicine.  Work-up returned completely reassuring.  The only thing that seemed abnormal was a large stool burden which will we saw on imaging.  No evidence of spleen abnormality, bowel obstruction, lung abnormality, or PE.  Did not see any evidence of large lymph nodes, lymphoma, nodules, or other new abnormalities.  Labs reassuring.  No evidence of mono or strep.  Troponin negative x2.  Given reassuring work-up, suspect constipation is the main cause of symptoms.  Patient was started on MiraLAX and will follow up with her PCP and stay hydrated.  Patient agreed with plan of care and was discharged in good condition feeling better.  Final Clinical Impressions(s) / ED Diagnoses   Final diagnoses:  Generalized abdominal pain  Constipation, unspecified constipation type    ED Discharge Orders         Ordered    polyethylene glycol (MIRALAX / GLYCOLAX) 17 g packet  Daily     07/24/19 2313         Clinical Impression: 1. Generalized abdominal pain   2. Constipation, unspecified constipation type     Disposition: Discharge  Condition: Good  I have discussed the results, Dx and Tx plan with the pt(& family if present). He/she/they expressed understanding and agree(s) with the plan. Discharge instructions discussed at great length. Strict return precautions discussed and pt &/or family have verbalized understanding of the instructions. No further questions at time of discharge.    Discharge Medication List as of 07/24/2019 11:14 PM    START taking these medications   Details    polyethylene glycol (MIRALAX / GLYCOLAX) 17 g packet Take 17 g by mouth daily., Starting Sun 07/24/2019, Print        Follow Up: [facility] [address] [place] [phone] Schedule an appointment as soon as possible for a visit    [facility] [place] [id number] [place] [phone]  Caedin Mogan, Gwenyth Allegra, MD 07/25/19 0000

## 2019-08-05 ENCOUNTER — Ambulatory Visit: Payer: Self-pay | Attending: Internal Medicine | Admitting: Internal Medicine

## 2019-08-05 ENCOUNTER — Encounter: Payer: Self-pay | Admitting: Internal Medicine

## 2019-08-05 ENCOUNTER — Ambulatory Visit: Payer: Medicaid Other | Admitting: Pharmacist

## 2019-08-05 ENCOUNTER — Other Ambulatory Visit: Payer: Self-pay

## 2019-08-05 VITALS — BP 128/87 | HR 83 | Temp 98.4°F | Resp 16 | Ht 62.5 in | Wt 176.8 lb

## 2019-08-05 DIAGNOSIS — Z2821 Immunization not carried out because of patient refusal: Secondary | ICD-10-CM

## 2019-08-05 DIAGNOSIS — K5909 Other constipation: Secondary | ICD-10-CM

## 2019-08-05 DIAGNOSIS — Z23 Encounter for immunization: Secondary | ICD-10-CM

## 2019-08-05 DIAGNOSIS — E049 Nontoxic goiter, unspecified: Secondary | ICD-10-CM

## 2019-08-05 DIAGNOSIS — Z86718 Personal history of other venous thrombosis and embolism: Secondary | ICD-10-CM

## 2019-08-05 DIAGNOSIS — Z3009 Encounter for other general counseling and advice on contraception: Secondary | ICD-10-CM

## 2019-08-05 DIAGNOSIS — F17200 Nicotine dependence, unspecified, uncomplicated: Secondary | ICD-10-CM

## 2019-08-05 MED ORDER — POLYETHYLENE GLYCOL 3350 17 GM/SCOOP PO POWD: 17.0000 g | Freq: Every day | ORAL | 1 refills | Status: DC | PRN

## 2019-08-05 MED ORDER — DOCUSATE SODIUM 100 MG PO CAPS: 100.0000 mg | ORAL_CAPSULE | Freq: Two times a day (BID) | ORAL | 1 refills | Status: DC

## 2019-08-05 NOTE — Patient Instructions (Signed)
Td Vaccine (Tetanus and Diphtheria): What You Need to Know 1. Why get vaccinated? Tetanus  and diphtheria are very serious diseases. They are rare in the [place] today, but people who do become infected often have severe complications. Td vaccine is used to protect adolescents and adults from both of these diseases. Both tetanus and diphtheria are infections caused by bacteria. Diphtheria spreads from person to person through coughing or sneezing. Tetanus-causing bacteria enter the body through cuts, scratches, or wounds. TETANUS (Lockjaw) causes painful muscle tightening and stiffness, usually all over the body.  It can lead to tightening of muscles in the head and neck so you can't open your mouth, swallow, or sometimes even breathe. Tetanus kills about 1 out of every 10 people who are infected even after receiving the best medical care. DIPHTHERIA can cause a thick coating to form in the back of the throat.  It can lead to breathing problems, paralysis, heart failure, and death. Before vaccines, as many as 200,000 cases of diphtheria and hundreds of cases of tetanus were reported in the [place] each year. Since vaccination began, reports of cases for both diseases have dropped by about 99%. 2. Td vaccine Td vaccine can protect adolescents and adults from tetanus and diphtheria. Td is usually given as a booster dose every 10 years but it can also be given earlier after a severe and dirty wound or burn. Another vaccine, called Tdap, which protects against pertussis in addition to tetanus and diphtheria, is sometimes recommended instead of Td vaccine. Your doctor or the person giving you the vaccine can give you more information. Td may safely be given at the same time as other vaccines. 3. Some people should not get this vaccine  A person who has ever had a life-threatening allergic reaction after a previous dose of any tetanus or diphtheria containing vaccine, OR has a severe allergy  to any part of this vaccine, should not get Td vaccine. Tell the person giving the vaccine about any severe allergies.  Talk to your doctor if you: ? had severe pain or swelling after any vaccine containing diphtheria or tetanus, ? ever had a condition called Guillain Barr Syndrome (GBS), ? aren't feeling well on the day the shot is scheduled. 4. Risks of a vaccine reaction With any medicine, including vaccines, there is a chance of side effects. These are usually mild and go away on their own. Serious reactions are also possible but are rare. Most people who get Td vaccine do not have any problems with it. Mild Problems following Td vaccine: (Did not interfere with activities)  Pain where the shot was given (about 8 people in 10)  Redness or swelling where the shot was given (about 1 person in 4)  Mild fever (rare)  Headache (about 1 person in 4)  Tiredness (about 1 person in 4) Moderate Problems following Td vaccine: (Interfered with activities, but did not require medical attention)  Fever over 102F (rare) Severe Problems following Td vaccine: (Unable to perform usual activities; required medical attention)  Swelling, severe pain, bleeding and/or redness in the arm where the shot was given (rare). Problems that could happen after any vaccine:  People sometimes faint after a medical procedure, including vaccination. Sitting or lying down for about 15 minutes can help prevent fainting, and injuries caused by a fall. Tell your doctor if you feel dizzy, or have vision changes or ringing in the ears.  Some people get severe pain in the shoulder and have   difficulty moving the arm where a shot was given. This happens very rarely.  Any medication can cause a severe allergic reaction. Such reactions from a vaccine are very rare, estimated at fewer than 1 in a million doses, and would happen within a few minutes to a few hours after the vaccination. As with any medicine, there is a  very remote chance of a vaccine causing a serious injury or death. The safety of vaccines is always being monitored. For more information, visit: www.cdc.gov/vaccinesafety/ 5. What if there is a serious reaction? What should I look for?  Look for anything that concerns you, such as signs of a severe allergic reaction, very high fever, or unusual behavior. Signs of a severe allergic reaction can include hives, swelling of the face and throat, difficulty breathing, a fast heartbeat, dizziness, and weakness. These would usually start a few minutes to a few hours after the vaccination. What should I do?  If you think it is a severe allergic reaction or other emergency that can't wait, call 9-1-1 or get the person to the nearest hospital. Otherwise, call your doctor.  Afterward, the reaction should be reported to the Vaccine Adverse Event Reporting System (VAERS). Your doctor might file this report, or you can do it yourself through the VAERS web site at www.vaers.hhs.gov, or by calling 1-800-822-7967. VAERS does not give medical advice. 6. The National Vaccine Injury Compensation Program The National Vaccine Injury Compensation Program (VICP) is a federal program that was created to compensate people who may have been injured by certain vaccines. Persons who believe they may have been injured by a vaccine can learn about the program and about filing a claim by calling 1-800-338-2382 or visiting the VICP website at www.hrsa.gov/vaccinecompensation. There is a time limit to file a claim for compensation. 7. How can I learn more?  Ask your doctor. He or she can give you the vaccine package insert or suggest other sources of information.  Call your local or state health department.  Contact the Centers for Disease Control and Prevention (CDC): ? Call 1-800-232-4636 (1-800-CDC-INFO) ? Visit CDC's website at www.cdc.gov/vaccines Vaccine Information Statement Td Vaccine (01/15/16) This information is  not intended to replace advice given to you by your health care provider. Make sure you discuss any questions you have with your health care provider. Document Released: 07/20/2006 Document Revised: 05/10/2018 Document Reviewed: 05/10/2018 Elsevier Interactive Patient Education  2020 [employer]  

## 2019-08-05 NOTE — Progress Notes (Signed)
Patient ID: Joan Knight, female    DOB: 09/07/1986  MRN: [id number]  CC: New Patient (Initial Visit)   Subjective: Asia Psomas is a 33 y.o. female who presents for new pt visit Her concerns today include:   Pt wants to have Nexplanon removed.  She has had it for 3 years now.  She has family-planning Medicaid.  Has a lump LT supraclavicular area about the size of a pea since 05/2019.  Goes and comes.  She tells me that she feels it more at night because it tends to get bigger then.  "Feels like a fatty mass."  She is also concerned that it may be a cancerous lymph node.  Of note she was in the emergency room this month and had CTA of her chest that did not reveal any axillary lymphadenopathy.  Thyroid gland was also noted to have no significant findings.  She is a smoker.  She is smoked since the age of 33.  Problems with constipattion Has to use suppositary every day for past 10 yrs in order to have a bowel movement. Stools hard Drinks 3 bottles water daily Eat greens, salad, almost daily. No blood in stools.    Had history of DVT in the right lower extremity about 5 years ago.  She tells me she was on blood thinner for about a year and then she stopped taking it.  She wonders whether she may have another clot because her calf feels swollen on the right side.  She does not have any shortness of breath.  Patient Active Problem List   Diagnosis Date Noted  . Hb-SS disease without crisis ([place]) 02/24/2017  . History of DVT of lower extremity 02/24/2017  . Nexplanon in place 02/24/2017     Current Outpatient Medications on File Prior to Visit  Medication Sig Dispense Refill  . ibuprofen (ADVIL,MOTRIN) 600 MG tablet Take 1 tablet (600 mg total) by mouth every 6 (six) hours as needed. (Patient not taking: Reported on 05/14/2019) 30 tablet 0  . pantoprazole (PROTONIX) 20 MG tablet Take 1 tablet (20 mg total) by mouth daily. 30 tablet 0  . polyethylene glycol (MIRALAX / GLYCOLAX) 17 g  packet Take 17 g by mouth daily. 14 each 0   No current facility-administered medications on file prior to visit.     No Known Allergies  Social History   Socioeconomic History  . Marital status: Single    Spouse name: Not on file  . Number of children: Not on file  . Years of education: Not on file  . Highest education level: Not on file  Occupational History  . Not on file  Social Needs  . Financial resource strain: Not on file  . Food insecurity    Worry: Not on file    Inability: Not on file  . Transportation needs    Medical: Not on file    Non-medical: Not on file  Tobacco Use  . Smoking status: Former Smoker    Packs/day: 0.50    Types: Cigarettes    Quit date: 08/27/2016    Years since quitting: 2.9  . Smokeless tobacco: Never Used  Substance and Sexual Activity  . Alcohol use: No  . Drug use: No  . Sexual activity: Not on file  Lifestyle  . Physical activity    Days per week: Not on file    Minutes per session: Not on file  . Stress: Not on file  Relationships  . Social connections  Talks on phone: Not on file    Gets together: Not on file    Attends religious service: Not on file    Active member of club or organization: Not on file    Attends meetings of clubs or organizations: Not on file    Relationship status: Not on file  . Intimate partner violence    Fear of current or ex partner: Not on file    Emotionally abused: Not on file    Physically abused: Not on file    Forced sexual activity: Not on file  Other Topics Concern  . Not on file  Social History Narrative  . Not on file    No family history on file.  No past surgical history on file.  ROS: Review of Systems Negative except as stated above  PHYSICAL EXAM: BP 128/87   Pulse 83   Temp 98.4 F (36.9 C) (Oral)   Resp 16   Ht 5' 2.5" (1.588 m)   Wt 176 lb 12.8 oz (80.2 kg)   SpO2 98%   BMI 31.82 kg/m   Physical Exam  General appearance - alert, well appearing, and in  no distress Mental status - normal mood, behavior, speech, dress, motor activity, and thought processes Neck -she seems to have mild thyroid enlargement bilaterally.  No thyroid nodules palpated.  No mass palpated in the supraclavicular area on the left side.  Even when patient was asked to put my finger on the area, I did not appreciate any pea soft tissue mass. Lymphatics -no cervical, axillary lymphadenopathy Chest - clear to auscultation, no wheezes, rales or rhonchi, symmetric air entry Heart - normal rate, regular rhythm, normal S1, S2, no murmurs, rubs, clicks or gallops Extremities -no edema noted of either lower extremity.  No swelling of the calf noted   CMP Latest Ref Rng & Units 07/24/2019 05/14/2019 05/14/2019  Glucose 70 - 99 mg/dL 89 - 88  BUN 6 - 20 mg/dL 14 - 15  Creatinine 0.44 - 1.00 mg/dL 0.93 0.90 0.93  Sodium 135 - 145 mmol/L 138 - 139  Potassium 3.5 - 5.1 mmol/L 3.8 - 4.4  Chloride 98 - 111 mmol/L 107 - 110  CO2 22 - 32 mmol/L 24 - 24  Calcium 8.9 - 10.3 mg/dL 9.2 - 9.0  Total Protein 6.5 - 8.1 g/dL 7.3 - -  Total Bilirubin 0.3 - 1.2 mg/dL 0.3 - -  Alkaline Phos 38 - 126 U/L 43 - -  AST 15 - 41 U/L 20 - -  ALT 0 - 44 U/L 14 - -   Lipid Panel  No results found for: CHOL, TRIG, HDL, CHOLHDL, VLDL, LDLCALC, LDLDIRECT  CBC    Component Value Date/Time   WBC 6.7 07/24/2019 1711   RBC 4.35 07/24/2019 1711   HGB 13.1 07/24/2019 1711   HCT 36.9 07/24/2019 1711   PLT 308 07/24/2019 1711   MCV 84.8 07/24/2019 1711   MCH 30.1 07/24/2019 1711   MCHC 35.5 07/24/2019 1711   RDW 13.4 07/24/2019 1711   LYMPHSABS 2.3 05/14/2019 1351   MONOABS 0.8 05/14/2019 1351   EOSABS 0.2 05/14/2019 1351   BASOSABS 0.0 05/14/2019 1351    ASSESSMENT AND PLAN: 1. Chronic constipation Encourage her to continue with increased fiber in the diet especially stocking green vegetables.  Give a trial of MiraLAX with Colace.  If problem still persists can refer to GI. - polyethylene glycol  powder (GLYCOLAX/MIRALAX) 17 GM/SCOOP powder; Take 17 g by mouth daily as  needed.  Dispense: 3350 g; Refill: 1 - docusate sodium (COLACE) 100 MG capsule; Take 1 capsule (100 mg total) by mouth 2 (two) times daily.  Dispense: 60 capsule; Refill: 1  2. Thyroid enlargement - TSH  3. Family planning We will refer to gynecology to have the Nexplanon removed - Ambulatory referral to Gynecology  4. Influenza vaccination declined   5. Tobacco dependence Discussed health risks associated with smoking.  Advised to quit.  Patient did not indicate 1 way or the other whether she is ready to give a trial of quitting.  Less than 5 minutes spent on counseling.  6. History of DVT (deep vein thrombosis) Exam and history at this time does not suggest recurrent DVT.  In regards to her concern about mass felt in the left supraclavicular area, reassurance given.  I do not feel or see any mass on exam.  Patient was given the opportunity to ask questions.  Patient verbalized understanding of the plan and was able to repeat key elements of the plan.   Orders Placed This Encounter  Procedures  . TSH  . Ambulatory referral to Gynecology     Requested Prescriptions   Signed Prescriptions Disp Refills  . polyethylene glycol powder (GLYCOLAX/MIRALAX) 17 GM/SCOOP powder 3350 g 1    Sig: Take 17 g by mouth daily as needed.  . docusate sodium (COLACE) 100 MG capsule 60 capsule 1    Sig: Take 1 capsule (100 mg total) by mouth 2 (two) times daily.    Return if symptoms worsen or fail to improve.  Karle Plumber, MD, FACP

## 2019-08-05 NOTE — Progress Notes (Signed)
Pt states she think she has a lipoma in her neck  Pt states she is having issues with constipation  Pt states she has to use suppository everyday

## 2019-08-05 NOTE — Progress Notes (Signed)
Patient presents for vaccination against influenza per orders of Dr. Johnson. Consent given. Counseling provided. No contraindications exists. Vaccine administered without incident.   

## 2019-08-06 LAB — TSH: TSH: 0.267 u[IU]/mL — ABNORMAL LOW (ref 0.450–4.500)

## 2019-08-08 NOTE — Addendum Note (Signed)
Addended by: Karle Plumber B on: 08/08/2019 08:35 AM   Modules accepted: Orders

## 2019-08-10 LAB — SPECIMEN STATUS REPORT

## 2019-08-10 LAB — T4, FREE: Free T4: 1.33 ng/dL (ref 0.82–1.77)

## 2019-08-10 LAB — T3, FREE: T3, Free: 3.2 pg/mL (ref 2.0–4.4)

## 2019-09-26 ENCOUNTER — Encounter: Payer: Self-pay | Admitting: Obstetrics and Gynecology

## 2019-09-26 ENCOUNTER — Ambulatory Visit (INDEPENDENT_AMBULATORY_CARE_PROVIDER_SITE_OTHER): Payer: Medicaid Other | Admitting: Obstetrics and Gynecology

## 2019-09-26 ENCOUNTER — Other Ambulatory Visit: Payer: Self-pay

## 2019-09-26 ENCOUNTER — Other Ambulatory Visit (HOSPITAL_COMMUNITY)
Admission: RE | Admit: 2019-09-26 | Discharge: 2019-09-26 | Disposition: A | Discharge status: Home / Self Care | Payer: Medicaid Other | Source: Ambulatory Visit | Attending: Obstetrics and Gynecology | Admitting: Obstetrics and Gynecology

## 2019-09-26 VITALS — BP 114/74 | HR 75 | Wt 179.6 lb

## 2019-09-26 DIAGNOSIS — Z01419 Encounter for gynecological examination (general) (routine) without abnormal findings: Secondary | ICD-10-CM | POA: Diagnosis present

## 2019-09-26 DIAGNOSIS — Z975 Presence of (intrauterine) contraceptive device: Secondary | ICD-10-CM

## 2019-09-26 DIAGNOSIS — Z3046 Encounter for surveillance of implantable subdermal contraceptive: Secondary | ICD-10-CM | POA: Diagnosis not present

## 2019-09-26 DIAGNOSIS — N879 Dysplasia of cervix uteri, unspecified: Secondary | ICD-10-CM

## 2019-09-26 DIAGNOSIS — B373 Candidiasis of vulva and vagina: Secondary | ICD-10-CM

## 2019-09-26 DIAGNOSIS — Z0000 Encounter for general adult medical examination without abnormal findings: Secondary | ICD-10-CM | POA: Diagnosis not present

## 2019-09-26 DIAGNOSIS — B3731 Acute candidiasis of vulva and vagina: Secondary | ICD-10-CM

## 2019-09-26 DIAGNOSIS — N760 Acute vaginitis: Secondary | ICD-10-CM

## 2019-09-26 DIAGNOSIS — N898 Other specified noninflammatory disorders of vagina: Secondary | ICD-10-CM

## 2019-09-26 DIAGNOSIS — F32A Depression, unspecified: Secondary | ICD-10-CM

## 2019-09-26 DIAGNOSIS — Z720 Tobacco use: Secondary | ICD-10-CM

## 2019-09-26 DIAGNOSIS — F329 Major depressive disorder, single episode, unspecified: Secondary | ICD-10-CM

## 2019-09-26 DIAGNOSIS — B9689 Other specified bacterial agents as the cause of diseases classified elsewhere: Secondary | ICD-10-CM

## 2019-09-26 DIAGNOSIS — I82409 Acute embolism and thrombosis of unspecified deep veins of unspecified lower extremity: Secondary | ICD-10-CM | POA: Insufficient documentation

## 2019-09-26 HISTORY — PX: REMOVAL OF IMPLANON ROD: OBO 1006

## 2019-09-26 MED ORDER — FLUCONAZOLE 150 MG PO TABS: 150.0000 mg | ORAL_TABLET | Freq: Once | ORAL | 0 refills | Status: AC

## 2019-09-26 MED ORDER — METRONIDAZOLE 500 MG PO TABS: 500.0000 mg | ORAL_TABLET | Freq: Two times a day (BID) | ORAL | 0 refills | Status: DC

## 2019-09-26 MED FILL — metroNIDAZOLE 500 MG TABS: 500 | 7 days supply | Qty: 14 | Fill #0

## 2019-09-26 MED FILL — FLUCONAZOLE 150 MG TABS: 150 | 1 days supply | Qty: 1 | Fill #0

## 2019-09-26 NOTE — BH Specialist Note (Signed)
Integrated Behavioral Health via Telemedicine Video Visit  Collaborative Care: Dr. Einar Grad  Recommends to begin Zoloft 25mg   09/26/2019 Joan Knight [id number]  Number of [place] visits: 1 Session Start time: 10:08  Session End time: 10:23 Total time: 15  Referring Provider: Aletha Halim, MD Type of Visit: Video Patient/Family location: Home [facility] Provider location: [facility] All persons participating in visit: [name] and [place]     Confirmed patient's address: Yes  Confirmed patient's phone number: Yes  Any changes to demographics: No   Confirmed patient's insurance: Yes  Any changes to patient's insurance: No   Discussed confidentiality: Yes   I connected with Joan Knight by a video enabled telemedicine application and verified that I am speaking with the correct person using two identifiers.     I discussed the limitations of evaluation and management by telemedicine and the availability of in person appointments.  I discussed that the purpose of this visit is to provide behavioral health care while limiting exposure to the novel coronavirus.   Discussed there is a possibility of technology failure and discussed alternative modes of communication if that failure occurs.  I discussed that engaging in this video visit, they consent to the provision of behavioral healthcare and the services will be billed under their insurance.  Patient and/or legal guardian expressed understanding and consented to video visit: Yes   PRESENTING CONCERNS: Patient and/or family reports the following symptoms/concerns: Pt states her primary concern is depression and anxiety that began as a child, and escalated after the loss of her stepmother with increased stress and excessive worry interfering with sleep; panic attacks twice weekly with shaking, feeling overheated, numbness in hands to feeling sick and throwing up when panic begins. Duration  of problem: Ongoing, with increase after loss away in past year; Severity of problem: severe  STRENGTHS (Protective Factors/Coping Skills): Open to treatment; recognizing a greater need for self-care  GOALS ADDRESSED: Patient will: 1.  Reduce symptoms of: anxiety, depression, insomnia and stress  2.  Increase knowledge and/or ability of: healthy habits and stress reduction  3.  Demonstrate ability to: Increase healthy adjustment to current life circumstances  INTERVENTIONS: Interventions utilized:  Supportive Counseling, Sleep Hygiene and Psychoeducation and/or Health Education Standardized Assessments completed: GAD-7 and PHQ 9  ASSESSMENT: Patient currently experiencing Major depressive disorder, current episode severe, without psychotic features.   Patient may benefit from psychoeducation and brief therapeutic interventions regarding coping with symptoms of depression, anxiety, and life stress .  PLAN: 1. Follow up with behavioral health clinician on : One week 2. Behavioral recommendations:  -Begin taking Zoloft as prescribed -Start using sleep sounds (ocean) tonight at bedtime; continue nightly for as long as remains helpful 3. Referral(s): [place] (In Clinic)   I discussed the assessment and treatment plan with the patient and/or parent/guardian. They were provided an opportunity to ask questions and all were answered. They agreed with the plan and demonstrated an understanding of the instructions.   They were advised to call back or seek an in-person evaluation if the symptoms worsen or if the condition fails to improve as anticipated.  Caroleen Hamman [facility]  Depression screen [facility] 2/9 09/28/2019 09/26/2019 08/05/2019 02/24/2017 03/30/2014  Decreased Interest 3 0 - 0 0  Down, Depressed, Hopeless 3 1 3  0 0  PHQ - 2 Score 6 1 3  0 0  Altered sleeping 3 2 2  - -  Tired, decreased energy 3 2 3  - -  Change in appetite 0 0 0 - -  Feeling bad or failure  about yourself  3 2 3  - -  Trouble concentrating 3 0 1 - -  Moving slowly or fidgety/restless 3 2 0 - -  Suicidal thoughts 0 0 3 - -  PHQ-9 Score 21 9 15  - -   GAD 7 : Generalized Anxiety Score 09/28/2019 09/26/2019 08/05/2019  Nervous, Anxious, on Edge 3 2 3   Control/stop worrying 3 2 3   Worry too much - different things 3 2 3   Trouble relaxing 3 2 3   Restless 3 1 3   Easily annoyed or irritable 3 2 3   Afraid - awful might happen 0 0 3  Total GAD 7 Score 18 11 21
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
## 2019-09-26 NOTE — Procedures (Signed)
Nexplanon Removal Procedure Note Prior to the procedure being performed, the patient (or guardian) was asked to state their full name, date of birth, type of procedure being performed and the exact location of the operative site. This information was then checked against the documentation in the patient's chart. Prior to the procedure being performed, a "time out" was performed by the physician that confirmed the correct patient, procedure and site.  After informed consent was obtained, the patient's left arm was examined and the Nexplanon rod was noted to be easily palpable. The area was cleaned with alcohol then local anesthesia was infiltrated with 3 ml of 1% lidocaine with epi. The area was prepped with betadine. Using sterile technique, the Nexplanon device was brought to the incision site. The capsule was scrapped off with the scalpel, the Nexplanon grasped with hemostats, and easily removed; the removal site was hemostatic. The Nexplanon was inspected and noted to be intact.  A steri-strip and a pressure dressing was applied.  The patient tolerated the procedure well.  She chose to do nothing for birth control.   Durene Romans MD Attending Center for [facility])

## 2019-09-28 ENCOUNTER — Ambulatory Visit (INDEPENDENT_AMBULATORY_CARE_PROVIDER_SITE_OTHER): Payer: Self-pay | Admitting: Clinical

## 2019-09-28 ENCOUNTER — Other Ambulatory Visit: Payer: Self-pay

## 2019-09-28 DIAGNOSIS — F322 Major depressive disorder, single episode, severe without psychotic features: Secondary | ICD-10-CM

## 2019-09-28 LAB — CYTOLOGY - PAP
Chlamydia: NEGATIVE
Comment: NEGATIVE
Comment: NEGATIVE
Comment: NORMAL
Diagnosis: NEGATIVE
High risk HPV: NEGATIVE
Neisseria Gonorrhea: NEGATIVE

## 2019-09-28 NOTE — BH Specialist Note (Signed)
Integrated Behavioral Health via Telemedicine Phone Visit  09/28/2019 Joan Knight [id number]  Number of [place] visits: 2 Session Start time: 2:22  Session End time: 2:50 Total time: 28  Referring Provider: Aletha Halim, MD Type of Visit: Video Patient/Family location: Home [facility] Provider location: [facility] All persons participating in visit: [name] and [place]    Confirmed patient's address: Yes  Confirmed patient's phone number: Yes  Any changes to demographics: No   Confirmed patient's insurance: Yes  Any changes to patient's insurance: No   Discussed confidentiality: At previous visit  I connected with Joan Knight by a video enabled telemedicine application and verified that I am speaking with the correct person using two identifiers.     I discussed the limitations of evaluation and management by telemedicine and the availability of in person appointments.  I discussed that the purpose of this visit is to provide behavioral health care while limiting exposure to the novel coronavirus.   Discussed there is a possibility of technology failure and discussed alternative modes of communication if that failure occurs.  I discussed that engaging in this video visit, they consent to the provision of behavioral healthcare and the services will be billed under their insurance.  Patient and/or legal guardian expressed understanding and consented to video visit: Yes   PRESENTING CONCERNS: Patient and/or family reports the following symptoms/concerns: Pt states her primary concern today is having 2 panic attacks in the past week, with "neck swelling, chest tightening up, and my left side is weak and numb, when it happens"; pt says she has been to her PCP about this, as well as the hospital in the past. Pt will pick up Zoloft today; open to learning a coping strategy for stress today  STRENGTHS (Protective Factors/Coping  Skills): Open to treatment; open to learning self-coping strategies  GOALS ADDRESSED: Patient will: 1.  Reduce symptoms of: anxiety and depression  2.  Increase knowledge and/or ability of: healthy habits and stress reduction  3.  Demonstrate ability to: Increase healthy adjustment to current life circumstances and Increase motivation to adhere to plan of care  INTERVENTIONS: Interventions utilized:  Mindfulness or Psychologist, educational and Psychoeducation and/or Health Education Standardized Assessments completed: Not given today  ASSESSMENT: Patient currently experiencing Major depressive disorder, severe, without psychotic features.   Patient may benefit from psychoeducation and brief therapeutic interventions regarding coping with symptoms of depression and anxiety  PLAN: 1. Follow up with behavioral health clinician on : One week 2. Behavioral recommendations:  -Begin taking Zoloft as prescribed -CALM relaxation breathing exercise twice daily (morning; evening) -Consider using ocean sleep sounds nightly for improved sleep 3. Referral(s): [place] (In Clinic)  I discussed the assessment and treatment plan with the patient and/or parent/guardian. They were provided an opportunity to ask questions and all were answered. They agreed with the plan and demonstrated an understanding of the instructions.   They were advised to call back or seek an in-person evaluation if the symptoms worsen or if the condition fails to improve as anticipated.  Caroleen Hamman [facility]  Depression screen [facility] 2/9 09/28/2019 09/26/2019 08/05/2019 02/24/2017 03/30/2014  Decreased Interest 3 0 - 0 0  Down, Depressed, Hopeless 3 1 3  0 0  PHQ - 2 Score 6 1 3  0 0  Altered sleeping 3 2 2  - -  Tired, decreased energy 3 2 3  - -  Change in appetite 0 0 0 - -  Feeling bad or failure  about yourself  3 2 3  - -  Trouble concentrating 3 0 1 - -  Moving slowly or fidgety/restless 3 2 0 - -   Suicidal thoughts 0 0 3 - -  PHQ-9 Score 21 9 15  - -   GAD 7 : Generalized Anxiety Score 09/28/2019 09/26/2019 08/05/2019  Nervous, Anxious, on Edge 3 2 3   Control/stop worrying 3 2 3   Worry too much - different things 3 2 3   Trouble relaxing 3 2 3   Restless 3 1 3   Easily annoyed or irritable 3 2 3   Afraid - awful might happen 0 0 3  Total GAD 7 Score 18 11 21

## 2019-10-05 ENCOUNTER — Other Ambulatory Visit: Payer: Self-pay | Admitting: Obstetrics and Gynecology

## 2019-10-05 MED ORDER — SERTRALINE HCL 25 MG PO TABS: 25.0000 mg | ORAL_TABLET | Freq: Every day | ORAL | 2 refills | Status: DC

## 2019-10-12 ENCOUNTER — Other Ambulatory Visit: Payer: Self-pay

## 2019-10-12 ENCOUNTER — Ambulatory Visit (INDEPENDENT_AMBULATORY_CARE_PROVIDER_SITE_OTHER): Payer: Self-pay | Admitting: Clinical

## 2019-10-12 DIAGNOSIS — F322 Major depressive disorder, single episode, severe without psychotic features: Secondary | ICD-10-CM

## 2019-10-12 DIAGNOSIS — F419 Anxiety disorder, unspecified: Secondary | ICD-10-CM

## 2019-10-19 NOTE — BH Specialist Note (Signed)
Pt did not arrive to video visit and did not answer the phone ; Left HIPPA-compliant message to call back Joan Knight from [facility] [place] at [phone].  ; left MyChart message for patient.    [place] via Telemedicine Video Visit  10/19/2019 [name] [id number]   [name]

## 2019-10-20 ENCOUNTER — Ambulatory Visit: Payer: Medicaid Other | Admitting: Clinical

## 2019-10-20 DIAGNOSIS — Z91199 Patient's noncompliance with other medical treatment and regimen due to unspecified reason: Secondary | ICD-10-CM

## 2019-10-20 DIAGNOSIS — Z5329 Procedure and treatment not carried out because of patient's decision for other reasons: Secondary | ICD-10-CM

## 2019-11-01 ENCOUNTER — Encounter: Payer: Self-pay | Admitting: General Practice

## 2019-11-11 ENCOUNTER — Telehealth: Payer: Self-pay | Admitting: Licensed Clinical Social Worker

## 2019-11-11 NOTE — Telephone Encounter (Signed)
Call placed to patient regarding [facility]BH referral. There was no answer or option for LCSW to leave a message.

## 2019-11-25 ENCOUNTER — Telehealth: Payer: Self-pay | Admitting: Clinical

## 2019-11-25 NOTE — Telephone Encounter (Signed)
Left HIPPA-compliant message to call back Roselyn Reef from [facility] at [phone].

## 2019-11-29 ENCOUNTER — Telehealth: Payer: Self-pay | Admitting: Clinical

## 2019-11-29 NOTE — Telephone Encounter (Signed)
Left HIPPA-compliant message to call back Roselyn Reef from [facility] at [phone].

## 2019-12-06 ENCOUNTER — Telehealth: Payer: Self-pay | Admitting: Licensed Clinical Social Worker

## 2019-12-06 NOTE — Telephone Encounter (Signed)
Call placed to patient. LCSW left message requesting a return call.  

## 2019-12-07 ENCOUNTER — Ambulatory Visit: Payer: Self-pay | Attending: Family Medicine | Admitting: Physician Assistant

## 2019-12-07 ENCOUNTER — Telehealth: Payer: Self-pay | Admitting: Licensed Clinical Social Worker

## 2019-12-07 ENCOUNTER — Other Ambulatory Visit (HOSPITAL_COMMUNITY)
Admission: RE | Admit: 2019-12-07 | Discharge: 2019-12-07 | Disposition: A | Discharge status: Home / Self Care | Payer: Medicaid Other | Source: Ambulatory Visit | Attending: Family Medicine | Admitting: Family Medicine

## 2019-12-07 ENCOUNTER — Other Ambulatory Visit: Payer: Self-pay

## 2019-12-07 VITALS — BP 97/67 | HR 89 | Temp 98.0°F | Resp 16 | Ht 61.0 in | Wt 179.0 lb

## 2019-12-07 DIAGNOSIS — Z113 Encounter for screening for infections with a predominantly sexual mode of transmission: Secondary | ICD-10-CM

## 2019-12-07 DIAGNOSIS — R591 Generalized enlarged lymph nodes: Secondary | ICD-10-CM

## 2019-12-07 NOTE — Progress Notes (Signed)
Patient ID: Joan Knight, female   DOB: 06/07/1986, 34 y.o.   MRN: [id number]   Joan Knight, is a 34 y.o. female  [id number]  [id number]  DOB - [birth year]  Subjective:  Chief Complaint and HPI: Joan Knight is a 34 y.o. female here today for STD screening.  +SA with BF only and uses condoms "most of the time."  She is on her period now but prior to starting her period, she was having some whitish and itchy discharge.  No pelvic pain.  No fever.    Also thinks she has a supraclavicular LN on the L side.  She researched it on the internet.  It has been coming and going for months.  Today she says it is not enlarged.  She denies weight loss, fever, night sweats.  No cough.  No change in appetite.     ROS:   Constitutional:  No f/c, No night sweats, No unexplained weight loss. EENT:  No vision changes, No blurry vision, No hearing changes. No mouth, throat, or ear problems.  Respiratory: No cough, No SOB Cardiac: No CP, no palpitations GI:  No abd pain, No N/V/D. GU: No Urinary s/sx Musculoskeletal: No joint pain Neuro: No headache, no dizziness, no motor weakness.  Skin: No rash Endocrine:  No polydipsia. No polyuria.  Psych: Denies SI/HI  No problems updated.  ALLERGIES: No Known Allergies  PAST MEDICAL HISTORY: Past Medical History:  Diagnosis Date  . DVT (deep venous thrombosis) ([place])    Had history of DVT in the right lower extremity about 5 years ago.  She tells me she was on blood thinner for about a year and then she stopped taking it.    . Sickle cell anemia ([place])   . Sickle cell trait ([place])     MEDICATIONS AT HOME: Prior to Admission medications   Medication Sig Start Date End Date Taking? Authorizing Provider  docusate sodium (COLACE) 100 MG capsule Take 1 capsule (100 mg total) by mouth 2 (two) times daily. 08/05/19  Yes Ladell Pier, MD  ibuprofen (ADVIL,MOTRIN) 600 MG tablet Take 1 tablet (600 mg total) by mouth every 6 (six) hours as needed.  02/22/17  Yes Recardo Evangelist, PA-C  pantoprazole (PROTONIX) 20 MG tablet Take 1 tablet (20 mg total) by mouth daily. 05/14/19  Yes Virgel Manifold, MD  polyethylene glycol (MIRALAX / GLYCOLAX) 17 g packet Take 17 g by mouth daily. 07/24/19  Yes Tegeler, Gwenyth Allegra, MD  polyethylene glycol powder (GLYCOLAX/MIRALAX) 17 GM/SCOOP powder Take 17 g by mouth daily as needed. 08/05/19  Yes Ladell Pier, MD  sertraline (ZOLOFT) 25 MG tablet Take 1 tablet (25 mg total) by mouth daily. 10/05/19  Yes Aletha Halim, MD  metroNIDAZOLE (FLAGYL) 500 MG tablet Take 1 tablet (500 mg total) by mouth 2 (two) times daily. Patient not taking: Reported on 12/07/2019 09/26/19   Aletha Halim, MD     Objective:  EXAM:   Vitals:   12/07/19 1545  BP: 97/67  Pulse: 89  Resp: 16  Temp: 98 F (36.7 C)  SpO2: 100%  Weight: 179 lb (81.2 kg)  Height: 5\' 1"  (1.549 m)    General appearance : A&OX3. NAD. Non-toxic-appearing HEENT: Atraumatic and Normocephalic.  PERRLA. EOM intact.   Mouth-MMM, post pharynx WNL w/o erythema, No PND. No palpable supraclavicular nodes at all B Neck: supple, no JVD. No cervical lymphadenopathy. No thyromegaly Chest/Lungs:  Breathing-non-labored, Good air entry bilaterally, breath sounds normal without rales, rhonchi, or wheezing  CVS: S1 S2 regular, no murmurs, gallops, rubs  GU:  Speculum inserted.  Blood in vault.  [place] swab to wipe away blood from cervix.  No visible discharge.  Swabs of cervix taken Extremities: Bilateral Lower Ext shows no edema, both legs are warm to touch with = pulse throughout Neurology:  CN II-XII grossly intact, Non focal.   Psych:  TP linear. J/I fair. pressured speech. Appropriate eye contact and affect.  Skin:  No Rash  Data Review Lab Results  Component Value Date   HGBA1C 5.2 03/07/2014     Assessment & Plan   1. Lymphadenopathy No LN today.  RTC if returns - CBC with Differential/Platelet  2. Screening examination for STD  (sexually transmitted disease) Use condoms always.  Had norplant before but had it removed.   - Cervicovaginal ancillary only - HIV antibody (with reflex) - RPR   Patient have been counseled extensively about nutrition and exercise  Return in about 3 months (around 03/08/2020).  The patient was given clear instructions to go to ER or return to medical center if symptoms don't improve, worsen or new problems develop. The patient verbalized understanding. The patient was told to call to get lab results if they haven't heard anything in the next week.     Freeman Caldron, PA-C [facility], [place]   12/07/2019, 4:14 PM

## 2019-12-07 NOTE — Telephone Encounter (Signed)
Return call from patient received. LCSW introduced self and explained role at [facility]. Pt shared that she has made attempts to contact [facility] to schedule a follow up [facility] appointment.   Per pt's request, she would like LCSW McMannes to schedule follow up appt and leave details (time and date) on pt's voicemail.  Pt's PCP was updated in Epic and a same day appointment was scheduled. No additional concerns noted.

## 2019-12-07 NOTE — Progress Notes (Signed)
C/o white cheesy vaginal discharge, itchiness X  2 wks.  Requested STD screening

## 2019-12-08 ENCOUNTER — Telehealth (INDEPENDENT_AMBULATORY_CARE_PROVIDER_SITE_OTHER): Payer: Self-pay

## 2019-12-08 LAB — CBC WITH DIFFERENTIAL/PLATELET
Basophils Absolute: 0.1 10*3/uL (ref 0.0–0.2)
Basos: 1 %
EOS (ABSOLUTE): 0.1 10*3/uL (ref 0.0–0.4)
Eos: 2 %
Hematocrit: 37.7 % (ref 34.0–46.6)
Hemoglobin: 12.9 g/dL (ref 11.1–15.9)
Immature Grans (Abs): 0 10*3/uL (ref 0.0–0.1)
Immature Granulocytes: 0 %
Lymphocytes Absolute: 3.4 10*3/uL — ABNORMAL HIGH (ref 0.7–3.1)
Lymphs: 46 %
MCH: 30.4 pg (ref 26.6–33.0)
MCHC: 34.2 g/dL (ref 31.5–35.7)
MCV: 89 fL (ref 79–97)
Monocytes Absolute: 0.6 10*3/uL (ref 0.1–0.9)
Monocytes: 8 %
Neutrophils Absolute: 3.2 10*3/uL (ref 1.4–7.0)
Neutrophils: 43 %
Platelets: 345 10*3/uL (ref 150–450)
RBC: 4.25 x10E6/uL (ref 3.77–5.28)
RDW: 13.6 % (ref 11.7–15.4)
WBC: 7.4 10*3/uL (ref 3.4–10.8)

## 2019-12-08 LAB — RPR: RPR Ser Ql: NONREACTIVE

## 2019-12-08 LAB — HIV ANTIBODY (ROUTINE TESTING W REFLEX): HIV Screen 4th Generation wRfx: NONREACTIVE

## 2019-12-08 NOTE — Telephone Encounter (Signed)
Left voicemail informing patient that HIV and syphilis are negative. Blood count normal. Vaginal swab still pending, will call will results are available. Should you have any questions or concerns please call [facility] at [phone]. Nat Christen, CMA

## 2019-12-08 NOTE — Telephone Encounter (Signed)
-----   Message from [place] Donovan, [place] sent at 12/08/2019  8:23 AM EST ----- Your HIV, syphilis, and blood count is all back and normal/negative.  The vaginal/cervical swabs are still pending.  Thanks, Freeman Caldron, PA-C

## 2019-12-09 ENCOUNTER — Telehealth: Payer: Self-pay | Admitting: Clinical

## 2019-12-09 LAB — CERVICOVAGINAL ANCILLARY ONLY
Bacterial Vaginitis (gardnerella): POSITIVE — AB
Candida Glabrata: NEGATIVE
Candida Vaginitis: NEGATIVE
Chlamydia: NEGATIVE
Comment: NEGATIVE
Comment: NEGATIVE
Comment: NEGATIVE
Comment: NEGATIVE
Comment: NEGATIVE
Comment: NORMAL
Neisseria Gonorrhea: NEGATIVE
Trichomonas: NEGATIVE

## 2019-12-09 NOTE — Telephone Encounter (Signed)
Left HIPPA-compliant message to call back Roselyn Reef from [facility] at [phone].

## 2019-12-12 ENCOUNTER — Other Ambulatory Visit: Payer: Self-pay | Admitting: Physician Assistant

## 2019-12-12 MED ORDER — METRONIDAZOLE 500 MG PO TABS: 500.0000 mg | ORAL_TABLET | Freq: Two times a day (BID) | ORAL | 0 refills | Status: DC

## 2019-12-12 MED ORDER — FLUCONAZOLE 150 MG PO TABS: 150.0000 mg | ORAL_TABLET | Freq: Once | ORAL | 0 refills | Status: AC

## 2019-12-12 MED FILL — metroNIDAZOLE 500 MG TABS: 500 | 7 days supply | Qty: 14 | Fill #0

## 2019-12-12 MED FILL — FLUCONAZOLE 150 MG TABLET: 150 | 1 days supply | Qty: 1 | Fill #0

## 2020-03-06 NOTE — BH Specialist Note (Signed)
Integrated Behavioral Health via Telemedicine Phone Visit  03/06/2020 JAVONA BENSTON [id number]  Number of [place] visits: 3 Session Start time: 2:16  Session End time: 2:46 Total time: 30  Referring Provider: Aletha Halim, MD Type of Visit: Phone Patient/Family location: [facility] Provider location: [facility] [place] at [facility]  All persons participating in visit: Patient Christen Gaddy and [place]    Confirmed patient's address: Yes  Confirmed patient's phone number: Yes  Any changes to demographics: No   Confirmed patient's insurance: Yes  Any changes to patient's insurance: No   Discussed confidentiality: Yes  Depression screen [facility] 2/9 03/08/2020 09/28/2019 09/26/2019 08/05/2019 02/24/2017  Decreased Interest 3 3 0 - 0  Down, Depressed, Hopeless 1 3 1 3  0  PHQ - 2 Score 4 6 1 3  0  Altered sleeping 2 3 2 2  -  Tired, decreased energy 3 3 2 3  -  Change in appetite 0 0 0 0 -  Feeling bad or failure about yourself  2 3 2 3  -  Trouble concentrating 1 3 0 1 -  Moving slowly or fidgety/restless 3 3 2  0 -  Suicidal thoughts 0 0 0 3 -  PHQ-9 Score 15 21 9 15  -   GAD 7 : Generalized Anxiety Score 03/08/2020 09/28/2019 09/26/2019 08/05/2019  Nervous, Anxious, on Edge 2 3 2 3   Control/stop worrying 2 3 2 3   Worry too much - different things 3 3 2 3   Trouble relaxing 2 3 2 3   Restless 2 3 1 3   Easily annoyed or irritable 3 3 2 3   Afraid - awful might happen 0 0 0 3  Total GAD 7 Score 14 18 11 21      I connected with [name]  by a video enabled telemedicine application and verified that I am speaking with the correct person using two identifiers.     I discussed the limitations of evaluation and management by telemedicine and the availability of in person appointments.  I discussed that the purpose of this visit is to provide behavioral health care while limiting exposure to the novel coronavirus.    Discussed there is a possibility of technology failure and discussed alternative modes of communication if that failure occurs.  I discussed that engaging in this video visit, they consent to the provision of behavioral healthcare and the services will be billed under their insurance.  Patient and/or legal guardian expressed understanding and consented to video visit: Yes   PRESENTING CONCERNS: Patient and/or family reports the following symptoms/concerns: Pt states her primary concern today is having the first panic attack since January, with "neck swelling on both sides, hand shaking, feeling sweaty/overheated"; pt worries it may be another blood clot or a combination of panic and blood clot. Pt is taking Zoloft 25mg , but not daily; agrees to start taking as prescribed.  Duration of problem: Ongoing, with most recent yesterday; Severity of problem: moderately severe  STRENGTHS (Protective Factors/Coping Skills): Self-advocacy; implements self-coping strategies when appropriate  GOALS ADDRESSED: Patient will: 1.  Reduce symptoms of: anxiety, depression and stress  2.  Increase knowledge and/or ability of: stress reduction  3.  Demonstrate ability to: Increase healthy adjustment to current life circumstances, Increase motivation to adhere to plan of care and Improve medication compliance  INTERVENTIONS: Interventions utilized:  Motivational Interviewing, Medication Monitoring and Psychoeducation and/or Health Education Standardized Assessments completed: GAD-7 and PHQ 9  ASSESSMENT: Patient currently experiencing Anxiety disorder, unspecified  and Major depressive disorder, recurrent, moderate.   Patient may benefit from psychoeducation and brief therapeutic interventions regarding coping with symptoms of anxiety, depression and stress .  PLAN: 1. Follow up with behavioral health clinician on : Call [place] at [phone] for follow up at [facility], OR follow up with [place] at [facility] 2. Behavioral recommendations:  -Begin taking Zoloft 25mg  as prescribed; set timer on phone as reminder -Continue using relaxation breathing exercise when anxiety and/or panic begin escalating -Call PCP to schedule annual check-up; discuss with PCP neck swelling symptoms -Continue with plan to spend time on  much-needed recreation and rest through remainder of summer work break 3. Referral(s): [place] (In Clinic)  I discussed the assessment and treatment plan with the patient and/or parent/guardian. They were provided an opportunity to ask questions and all were answered. They agreed with the plan and demonstrated an understanding of the instructions.   They were advised to call back or seek an in-person evaluation if the symptoms worsen or if the condition fails to improve as anticipated.  Caroleen Hamman [facility]  Depression screen [facility] 2/9 03/08/2020 09/28/2019 09/26/2019 08/05/2019 02/24/2017  Decreased Interest 3 3 0 - 0  Down, Depressed, Hopeless 1 3 1 3  0  PHQ - 2 Score 4 6 1 3  0  Altered sleeping 2 3 2 2  -  Tired, decreased energy 3 3 2 3  -  Change in appetite 0 0 0 0 -  Feeling bad or failure about yourself  2 3 2 3  -  Trouble concentrating 1 3 0 1 -  Moving slowly or fidgety/restless 3 3 2  0 -  Suicidal thoughts 0 0 0 3 -  PHQ-9 Score 15 21 9 15  -   GAD 7 : Generalized Anxiety Score 03/08/2020 09/28/2019 09/26/2019 08/05/2019  Nervous, Anxious, on Edge 2 3 2 3   Control/stop worrying 2 3 2 3   Worry too much - different things 3 3 2 3   Trouble relaxing 2 3 2 3   Restless 2 3 1 3   Easily annoyed or irritable 3 3 2 3   Afraid - awful might happen 0 0 0 3  Total GAD 7 Score 14 18 11  21

## 2020-03-08 ENCOUNTER — Ambulatory Visit (INDEPENDENT_AMBULATORY_CARE_PROVIDER_SITE_OTHER): Payer: Self-pay | Admitting: Clinical

## 2020-03-08 ENCOUNTER — Other Ambulatory Visit: Payer: Self-pay

## 2020-03-08 DIAGNOSIS — F419 Anxiety disorder, unspecified: Secondary | ICD-10-CM

## 2020-03-08 DIAGNOSIS — F331 Major depressive disorder, recurrent, moderate: Secondary | ICD-10-CM

## 2020-03-13 ENCOUNTER — Telehealth: Payer: Self-pay | Admitting: Licensed Clinical Social Worker

## 2020-03-13 NOTE — Telephone Encounter (Signed)
Follow up call placed to patient. Patient shared concerns regarding increase in panic attacks triggered by swelling of the neck. Pt verbalized interest in scheduling an appointment with PCP ASAP to address conditions; however, an appointment was not available for approx 2-3 weeks.   Pt requested to be seen by a provider sooner; therefore, LCSW provided patient with information on Mobile Medicine Unit. Pt was strongly encouraged to follow up with LCSW or Counselor, Vesta Mixer regarding anxiety. Pt verbalized understanding and was appreciative for the follow up call.

## 2020-06-26 ENCOUNTER — Other Ambulatory Visit: Payer: Medicaid Other

## 2020-07-11 ENCOUNTER — Telehealth: Payer: Self-pay

## 2020-07-11 ENCOUNTER — Ambulatory Visit: Payer: Self-pay | Admitting: *Deleted

## 2020-07-11 ENCOUNTER — Encounter: Payer: Self-pay | Admitting: Emergency Medicine

## 2020-07-11 ENCOUNTER — Ambulatory Visit
Admission: EM | Admit: 2020-07-11 | Discharge: 2020-07-11 | Disposition: A | Discharge status: Home / Self Care | Payer: Self-pay | Attending: Emergency Medicine | Admitting: Emergency Medicine

## 2020-07-11 ENCOUNTER — Other Ambulatory Visit: Payer: Self-pay

## 2020-07-11 ENCOUNTER — Other Ambulatory Visit: Payer: Self-pay | Admitting: Emergency Medicine

## 2020-07-11 DIAGNOSIS — J019 Acute sinusitis, unspecified: Secondary | ICD-10-CM

## 2020-07-11 MED ORDER — AMOXICILLIN-POT CLAVULANATE 875-125 MG PO TABS: 1.0000 | ORAL_TABLET | Freq: Two times a day (BID) | ORAL | 0 refills | Status: DC

## 2020-07-11 MED ORDER — BENZONATATE 200 MG PO CAPS: 200.0000 mg | ORAL_CAPSULE | Freq: Three times a day (TID) | ORAL | 0 refills | Status: AC | PRN

## 2020-07-11 MED ORDER — FLUTICASONE PROPIONATE 50 MCG/ACT NA SUSP: 1.0000 | Freq: Every day | NASAL | 0 refills | Status: DC

## 2020-07-11 MED ORDER — DM-GUAIFENESIN ER 30-600 MG PO TB12: 1.0000 | ORAL_TABLET | Freq: Two times a day (BID) | ORAL | 0 refills | Status: DC

## 2020-07-11 MED FILL — BENZONATATE 100 MG CAPS: 100 | 9 days supply | Qty: 56 | Fill #0

## 2020-07-11 MED FILL — FLUTICASONE PROP 50 MCG SPR: 50 | 30 days supply | Qty: 16 | Fill #0

## 2020-07-11 MED FILL — AMOX-CLAV 875-125 MG TABLET: 875-125 | 7 days supply | Qty: 14 | Fill #0

## 2020-07-11 NOTE — Telephone Encounter (Signed)
Patient called with complaints of shorness of breath at rest; she states on 07/06/20 she had a scratchy throat, and congestion with runny nose over the weekend; she had a negative COVID test on 07/09/20; the patient says her SOB at rest started AM 07/11/20; she is not able to lay flat without her SOB getting worse; the patient also has loss of taste and smell;she has not had a COVID vaccination; recommendations made per nurse triage protocol; she verbalized understanding; the pt is seen by Dr Karle Plumber, [place]; will route to office for notification.  Reason for Disposition  MODERATE difficulty breathing (e.g., speaks in phrases, SOB even at rest, pulse 100-120)  Answer Assessment - Initial Assessment Questions 1. COVID-19 DIAGNOSIS: "Who made your Coronavirus (COVID-19) diagnosis?" "Was it confirmed by a positive lab test?" If not diagnosed by a HCP, ask "Are there lots of cases (community spread) where you live?" (See public health department website, if unsure)     Patient had negative test on 07/09/20 2. COVID-19 EXPOSURE: "Was there any known exposure to COVID before the symptoms began?" CDC Definition of close contact: within 6 feet (2 meters) for a total of 15 minutes or more over a 24-hour period.     3. ONSET: "When did the COVID-19 symptoms start?"     07/06/20 4. WORST SYMPTOM: "What is your worst symptom?" (e.g., cough, fever, shortness of breath, muscle aches)     Shortness of breath 5. COUGH: "Do you have a cough?" If Yes, ask: "How bad is the cough?"       severe 6. FEVER: "Do you have a fever?" If Yes, ask: "What is your temperature, how was it measured, and when did it start?"     no 7. RESPIRATORY STATUS: "Describe your breathing?" (e.g., shortness of breath, wheezing, unable to speak)     Shortness of breath at rest; wheezing; unable to lay flat due to shortness of breath 8. BETTER-SAME-WORSE: "Are you getting better, staying the same or getting worse  compared to yesterday?"  If getting worse, ask, "In what way?"     worse 9. HIGH RISK DISEASE: "Do you have any chronic medical problems?" (e.g., asthma, heart or lung disease, weak immune system, obesity, etc.)    10. PREGNANCY: "Is there any chance you are pregnant?" "When was your last menstrual period?"       11. OTHER SYMPTOMS: "Do you have any other symptoms?"  (e.g., chills, fatigue, headache, loss of smell or taste, muscle pain, sore throat; new loss of smell or taste especially support the diagnosis of COVID-19)     Sneezing, congestion, runny nose  Protocols used: CORONAVIRUS (COVID-19) DIAGNOSED OR SUSPECTED-A-AH

## 2020-07-11 NOTE — ED Provider Notes (Signed)
[name]    CSN: [id number] Arrival date & time: 07/11/20  8315      History   Chief Complaint Chief Complaint  Patient presents with  . URI  . Cough    HPI Joan Knight is a 34 y.o. female.  Presenting today for evaluation of URI symptoms.  Patient reports that all last week she had headache and sinus pressure, beginning Friday developed sore throat congestion and cough.  She has been using over-the-counter TheraFlu and combination cold and flu medicine without relief.  Had Covid test earlier in the week which was negative.  Continues to have low energy and persistent symptoms.  Concerned about mucus which is discolored.    HPI  Past Medical History:  Diagnosis Date  . DVT (deep venous thrombosis) ([place])    Had history of DVT in the right lower extremity about 5 years ago.  She tells me she was on blood thinner for about a year and then she stopped taking it.    . Sickle cell anemia ([place])   . Sickle cell trait [facility])     Patient Active Problem List   Diagnosis Date Noted  . Tobacco abuse 09/26/2019  . DVT (deep venous thrombosis) ([place])   . 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 Nexplanon in place 02/24/2017    Past Surgical History:  Procedure Laterality Date  . REMOVAL OF IMPLANON ROD  09/26/2019    OB History    Gravida  0   Para  0   Term  0   Preterm  0   AB  0   Living  0     SAB  0   TAB  0   Ectopic  0   Multiple  0   Live Births  0            Home Medications    Prior to Admission medications   Medication Sig Start Date End Date Taking? Authorizing Provider  amoxicillin-clavulanate (AUGMENTIN) 875-125 MG tablet Take 1 tablet by mouth every 12 (twelve) hours. 07/11/20   Cori Henningsen C, PA-C  benzonatate (TESSALON) 200 MG capsule Take 1 capsule (200 mg total) by mouth 3 (three) times daily as needed for up to 7 days for cough. 07/11/20 07/18/20  Eleonore Shippee C, PA-C  dextromethorphan-guaiFENesin  (MUCINEX DM) 30-600 MG 12hr tablet Take 1 tablet by mouth 2 (two) times daily. 07/11/20   Katheline Brendlinger C, PA-C  docusate sodium (COLACE) 100 MG capsule Take 1 capsule (100 mg total) by mouth 2 (two) times daily. 08/05/19   Ladell Pier, MD  fluticasone (FLONASE) 50 MCG/ACT nasal spray Place 1-2 sprays into both nostrils daily. 07/11/20   Graysyn Bache C, PA-C  ibuprofen (ADVIL,MOTRIN) 600 MG tablet Take 1 tablet (600 mg total) by mouth every 6 (six) hours as needed. 02/22/17   Recardo Evangelist, PA-C  pantoprazole (PROTONIX) 20 MG tablet Take 1 tablet (20 mg total) by mouth daily. 05/14/19   Virgel Manifold, MD  polyethylene glycol (MIRALAX / GLYCOLAX) 17 g packet Take 17 g by mouth daily. 07/24/19   Tegeler, Gwenyth Allegra, MD  polyethylene glycol powder (GLYCOLAX/MIRALAX) 17 GM/SCOOP powder Take 17 g by mouth daily as needed. 08/05/19   Ladell Pier, MD  sertraline (ZOLOFT) 25 MG tablet Take 1 tablet (25 mg total) by mouth daily. 10/05/19   Aletha Halim, MD    Family History History reviewed. No pertinent family history.  Social History Social History  Tobacco Use  . Smoking status: Current Every Day Smoker    Packs/day: 0.25    Years: 14.00    Pack years: 3.50    Types: Cigarettes    Last attempt to quit: 08/27/2016    Years since quitting: 3.8  . Smokeless tobacco: Never Used  Substance Use Topics  . Alcohol use: No  . Drug use: No     Allergies   Patient has no known allergies.   Review of Systems Review of Systems  Constitutional: Positive for chills and fatigue. Negative for activity change, appetite change and fever.  HENT: Positive for congestion and rhinorrhea. Negative for ear pain, sinus pressure, sore throat and trouble swallowing.   Eyes: Negative for discharge and redness.  Respiratory: Positive for cough. Negative for chest tightness and shortness of breath.   Cardiovascular: Negative for chest pain.  Gastrointestinal: Negative for abdominal  pain, diarrhea, nausea and vomiting.  Musculoskeletal: Positive for myalgias.  Skin: Negative for rash.  Neurological: Positive for headaches. Negative for dizziness and light-headedness.     Physical Exam Triage Vital Signs ED Triage Vitals [07/11/20 0930]  Enc Vitals Group     BP 120/72     Pulse Rate 77     Resp 18     Temp 98.6 F (37 C)     Temp Source Oral     SpO2 97 %     Weight      Height      Head Circumference      Peak Flow      Pain Score 5     Pain Loc      Pain Edu?      Excl. in [place]?    No data found.  Updated Vital Signs BP 120/72 (BP Location: Left Arm)   Pulse 77   Temp 98.6 F (37 C) (Oral)   Resp 18   SpO2 97%   Visual Acuity Right Eye Distance:   Left Eye Distance:   Bilateral Distance:    Right Eye Near:   Left Eye Near:    Bilateral Near:     Physical Exam Vitals and nursing note reviewed.  Constitutional:      Appearance: She is well-developed.     Comments: No acute distress  HENT:     Head: Normocephalic and atraumatic.     Ears:     Comments: Bilateral ears without tenderness to palpation of external auricle, tragus and mastoid, EAC's without erythema or swelling, TM's with good bony landmarks and cone of light. Non erythematous.     Nose: Nose normal.     Mouth/Throat:     Comments: Oral mucosa pink and moist, no tonsillar enlargement or exudate. Posterior pharynx patent and nonerythematous, no uvula deviation or swelling. Normal phonation. Eyes:     Conjunctiva/sclera: Conjunctivae normal.  Cardiovascular:     Rate and Rhythm: Normal rate.  Pulmonary:     Effort: Pulmonary effort is normal. No respiratory distress.     Comments: Breathing comfortably at rest, CTABL, no wheezing, rales or other adventitious sounds auscultated Abdominal:     General: There is no distension.  Musculoskeletal:        General: Normal range of motion.     Cervical back: Neck supple.  Skin:    General: Skin is warm and dry.  Neurological:      Mental Status: She is alert and oriented to person, place, and time.      UC Treatments / Results  Labs (all labs ordered are listed, but only abnormal results are displayed) Labs Reviewed - No data to display  EKG   Radiology No results found.  Procedures Procedures (including critical care time)  Medications Ordered in UC Medications - No data to display  Initial Impression / Assessment and Plan / UC Course  I have reviewed the triage vital signs and the nursing notes.  Pertinent labs & imaging results that were available during my care of the patient were reviewed by me and considered in my medical decision making (see chart for details).     URI symptoms and headaches greater than 1 week, covering for sinusitis with Augmentin.  Continue symptomatic and supportive care as well.  Rest and fluids.  Prior Covid test negative, will not repeat today.  Discussed strict return precautions. Patient verbalized understanding and is agreeable with plan.  Final Clinical Impressions(s) / UC Diagnoses   Final diagnoses:  Acute sinusitis with symptoms > 10 days     Discharge Instructions     Continue to rest and drink plenty of fluids Augmentin twice daily for 1 week to treat sinus infection Flonase nasal spray 1 to 2 spray in each nostril daily Mucinex DM for further cough and congestion relief Tessalon as needed for cough Ibuprofen and Tylenol Follow-up if not improving or worsening    ED Prescriptions    Medication Sig Dispense Auth. Provider   amoxicillin-clavulanate (AUGMENTIN) 875-125 MG tablet Take 1 tablet by mouth every 12 (twelve) hours. 14 tablet Aaliyha Mumford C, PA-C   fluticasone (FLONASE) 50 MCG/ACT nasal spray Place 1-2 sprays into both nostrils daily. 16 g Siris Hoos C, PA-C   benzonatate (TESSALON) 200 MG capsule Take 1 capsule (200 mg total) by mouth 3 (three) times daily as needed for up to 7 days for cough. 28 capsule Rayya Yagi C, PA-C    dextromethorphan-guaiFENesin (MUCINEX DM) 30-600 MG 12hr tablet Take 1 tablet by mouth 2 (two) times daily. 20 tablet Joshuwa Vecchio, [place] C, PA-C     PDMP not reviewed this encounter.   Rogelio Winbush, [place] C, PA-C 07/11/20 1003

## 2020-07-11 NOTE — Telephone Encounter (Signed)
Spoke w/ pt and she has an appt w/ [facility] this morning

## 2020-07-11 NOTE — Telephone Encounter (Signed)
Pt RC and LM on nurse line following [facility] RN triage for [facility], pt was contacted to give info for mobile bus but pt reported that she has already made an appt w/ UC @ [place] and will keep that appt for evaluation.

## 2020-07-11 NOTE — ED Triage Notes (Signed)
Pt here for URI with cough and congestion x 4 days; pt had negative covid test 2 days ago

## 2020-07-11 NOTE — Discharge Instructions (Signed)
Continue to rest and drink plenty of fluids Augmentin twice daily for 1 week to treat sinus infection Flonase nasal spray 1 to 2 spray in each nostril daily Mucinex DM for further cough and congestion relief Tessalon as needed for cough Ibuprofen and Tylenol Follow-up if not improving or worsening

## 2020-08-27 ENCOUNTER — Ambulatory Visit
Admission: EM | Admit: 2020-08-27 | Discharge: 2020-08-27 | Disposition: A | Discharge status: Home / Self Care | Payer: 59

## 2020-08-27 ENCOUNTER — Other Ambulatory Visit: Payer: Self-pay

## 2020-08-27 NOTE — ED Triage Notes (Addendum)
Pt is here wanting a COVID test, pt is denying ALL sxs today. Pt is flying to [place] from 08/30/2020-09/03/2020.

## 2020-08-30 LAB — NOVEL CORONAVIRUS, NAA: SARS-CoV-2, NAA: NOT DETECTED

## 2020-10-09 ENCOUNTER — Other Ambulatory Visit: Payer: Self-pay

## 2020-10-09 ENCOUNTER — Encounter (HOSPITAL_COMMUNITY): Payer: Self-pay | Admitting: *Deleted

## 2020-10-09 DIAGNOSIS — F17210 Nicotine dependence, cigarettes, uncomplicated: Secondary | ICD-10-CM | POA: Insufficient documentation

## 2020-10-09 DIAGNOSIS — R079 Chest pain, unspecified: Secondary | ICD-10-CM | POA: Diagnosis not present

## 2020-10-09 DIAGNOSIS — R1012 Left upper quadrant pain: Secondary | ICD-10-CM | POA: Diagnosis present

## 2020-10-09 LAB — COMPREHENSIVE METABOLIC PANEL
ALT: 45 U/L — ABNORMAL HIGH (ref 0–44)
AST: 30 U/L (ref 15–41)
Albumin: 4.8 g/dL (ref 3.5–5.0)
Alkaline Phosphatase: 50 U/L (ref 38–126)
Anion gap: 9 (ref 5–15)
BUN: 11 mg/dL (ref 6–20)
CO2: 25 mmol/L (ref 22–32)
Calcium: 9.7 mg/dL (ref 8.9–10.3)
Chloride: 105 mmol/L (ref 98–111)
Creatinine, Ser: 0.55 mg/dL (ref 0.44–1.00)
GFR, Estimated: >60 mL/min (ref >60)
Glucose, Bld: 85 mg/dL (ref 70–99)
Potassium: 3.8 mmol/L (ref 3.5–5.1)
Sodium: 139 mmol/L (ref 135–145)
Total Bilirubin: 0.4 mg/dL (ref 0.3–1.2)
Total Protein: 8.1 g/dL (ref 6.5–8.1)

## 2020-10-09 LAB — CBC
HCT: 37.2 % (ref 36.0–46.0)
Hemoglobin: 13.3 g/dL (ref 12.0–15.0)
MCH: 30.3 pg (ref 26.0–34.0)
MCHC: 35.8 g/dL (ref 30.0–36.0)
MCV: 84.7 fL (ref 80.0–100.0)
Platelets: 319 10*3/uL (ref 150–400)
RBC: 4.39 MIL/uL (ref 3.87–5.11)
RDW: 14.5 % (ref 11.5–15.5)
WBC: 7.1 10*3/uL (ref 4.0–10.5)
nRBC: 0 % (ref 0.0–0.2)

## 2020-10-09 LAB — URINALYSIS, ROUTINE W REFLEX MICROSCOPIC
Bilirubin Urine: NEGATIVE
Glucose, UA: NEGATIVE mg/dL
Ketones, ur: NEGATIVE mg/dL
Leukocytes,Ua: NEGATIVE
Nitrite: NEGATIVE
Protein, ur: NEGATIVE mg/dL
Specific Gravity, Urine: 1.018 (ref 1.005–1.030)
pH: 6 (ref 5.0–8.0)

## 2020-10-09 LAB — I-STAT BETA HCG BLOOD, ED (MC, WL, AP ONLY): I-stat hCG, quantitative: <5 m[IU]/mL (ref <5)

## 2020-10-09 LAB — LIPASE, BLOOD: Lipase: 21 U/L (ref 11–51)

## 2020-10-09 MED ORDER — ONDANSETRON 4 MG PO TBDP
4.0000 mg | ORAL_TABLET | Freq: Once | ORAL | Status: DC
  Filled 2020-10-09: qty 1

## 2020-10-09 MED ORDER — ACETAMINOPHEN 325 MG PO TABS
650.0000 mg | ORAL_TABLET | Freq: Once | ORAL | Status: AC | PRN
  Administered 2020-10-09: 650 mg via ORAL
  Filled 2020-10-09: qty 2

## 2020-10-09 NOTE — ED Triage Notes (Signed)
Pt states she has been having chest pain and upper left quad pain for several weeks, states left upper abd is swollen. Has doctors appoint Jan 25.

## 2020-10-10 ENCOUNTER — Other Ambulatory Visit: Payer: Self-pay | Admitting: Emergency Medicine

## 2020-10-10 ENCOUNTER — Emergency Department (HOSPITAL_COMMUNITY): Payer: 59

## 2020-10-10 ENCOUNTER — Emergency Department (HOSPITAL_COMMUNITY)
Admission: EM | Admit: 2020-10-10 | Discharge: 2020-10-10 | Disposition: A | Discharge status: Home / Self Care | Payer: 59 | Attending: Emergency Medicine | Admitting: Emergency Medicine

## 2020-10-10 DIAGNOSIS — R1012 Left upper quadrant pain: Secondary | ICD-10-CM

## 2020-10-10 MED ORDER — DICYCLOMINE HCL 20 MG PO TABS: 20.0000 mg | ORAL_TABLET | Freq: Two times a day (BID) | ORAL | 0 refills | Status: DC | PRN

## 2020-10-10 MED ORDER — DICYCLOMINE HCL 10 MG PO CAPS
10.0000 mg | ORAL_CAPSULE | Freq: Once | ORAL | Status: AC
  Administered 2020-10-10: 10 mg via ORAL
  Filled 2020-10-10: qty 1

## 2020-10-10 MED ORDER — ALUM & MAG HYDROXIDE-SIMETH 200-200-20 MG/5ML PO SUSP
30.0000 mL | Freq: Once | ORAL | Status: AC
  Administered 2020-10-10: 30 mL via ORAL
  Filled 2020-10-10: qty 30

## 2020-10-10 MED ORDER — LIDOCAINE VISCOUS HCL 2 % MT SOLN
15.0000 mL | Freq: Once | OROMUCOSAL | Status: AC
  Administered 2020-10-10: 15 mL via ORAL
  Filled 2020-10-10: qty 15

## 2020-10-10 MED FILL — DICYCLOMINE 20 MG TABLET: 20 | 10 days supply | Qty: 20 | Fill #0

## 2020-10-10 NOTE — Discharge Instructions (Signed)
Your evaluation in the ED today has been reassuring.  We recommend follow-up with your primary care doctor as well as follow-up with a gastroenterologist for ongoing evaluation of your left upper quadrant abdominal discomfort and bloating.  You may have a degree of constipation which causes intermittent pain.  Continue MiraLAX daily and drink plenty of water.  You may try Bentyl for management of pain, as prescribed.  Return to the ED for new or concerning symptoms.

## 2020-10-19 MED FILL — DICYCLOMINE 20 MG TABLET: 20 | 10 days supply | Qty: 20 | Fill #0

## 2020-10-27 NOTE — ED Provider Notes (Signed)
[name] Provider Note   CSN: [id number] Arrival date & time: 10/09/20  1130     History Chief Complaint  Patient presents with  . Abdominal Pain  . Chest Pain    Joan Knight is a 35 y.o. female.  35 year old female presents to the emergency department for evaluation of left upper quadrant abdominal pain.  Pain has present over the last several weeks.  However, patient has been experiencing these symptoms chronically for over 1 year.  Pain is intermittent without known aggravating or alleviating factors.  She subjectively feels as though her upper abdomen is "swollen".  Left upper quadrant abdominal pain intermittently radiates into the patient's chest.  She has seen her primary care provider for this in the past in addition to ED evaluation; all prior work-ups have been reassuring.  No fevers, hemoptysis, leg swelling, syncope, vomiting, bowel changes.  The history is provided by the patient. No language interpreter was used.  Abdominal Pain Associated symptoms: chest pain   Chest Pain Associated symptoms: abdominal pain        Past Medical History:  Diagnosis Date  . DVT (deep venous thrombosis) ([place])    Had history of DVT in the right lower extremity about 5 years ago.  She tells me she was on blood thinner for about a year and then she stopped taking it.    . Sickle cell anemia ([place])   . Sickle cell trait [facility])     Patient Active Problem List   Diagnosis Date Noted  . Tobacco abuse 09/26/2019  . DVT (deep venous thrombosis) ([place])   . 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 Nexplanon in place 02/24/2017    Past Surgical History:  Procedure Laterality Date  . REMOVAL OF IMPLANON ROD  09/26/2019     OB History    Gravida  0   Para  0   Term  0   Preterm  0   AB  0   Living  0     SAB  0   IAB  0   Ectopic  0   Multiple  0   Live Births  0           Family History  Problem Relation Age of  Onset  . Asthma Mother   . Anxiety disorder Mother   . Depression Mother   . Bronchitis Mother   . Emphysema Mother   . COPD Mother   . Deep vein thrombosis Father   . Hypertension Father   . Depression Father     Social History   Tobacco Use  . Smoking status: Current Every Day Smoker    Packs/day: 0.25    Years: 14.00    Pack years: 3.50    Types: Cigarettes    Last attempt to quit: 08/27/2016    Years since quitting: 4.1  . Smokeless tobacco: Never Used  Substance Use Topics  . Alcohol use: No  . Drug use: No    Home Medications Prior to Admission medications   Medication Sig Start Date End Date Taking? Authorizing Provider  acetaminophen (TYLENOL) 500 MG tablet Take 1,000 mg by mouth every 6 (six) hours as needed for mild pain.   Yes [provider]  dicyclomine (BENTYL) 20 MG tablet Take 1 tablet (20 mg total) by mouth every 12 (twelve) hours as needed (for abdominal pain/cramping). 10/10/20  Yes Antonietta Breach, PA-C  ibuprofen (ADVIL,MOTRIN) 600 MG tablet Take 1 tablet (600 mg  total) by mouth every 6 (six) hours as needed. Patient taking differently: Take 600 mg by mouth every 6 (six) hours as needed for moderate pain. 02/22/17  Yes Recardo Evangelist, PA-C  omeprazole (PRILOSEC OTC) 20 MG tablet Take 20 mg by mouth daily as needed (acid reflux).   Yes [provider]    Allergies    Patient has no known allergies.  Review of Systems   Review of Systems  Cardiovascular: Positive for chest pain.  Gastrointestinal: Positive for abdominal pain.  Ten systems reviewed and are negative for acute change, except as noted in the HPI.    Physical Exam Updated Vital Signs BP 108/89 (BP Location: Left Arm)   Pulse 79   Temp 99.4 F (37.4 C) (Oral)   Resp 15   Ht 5\' 1"  (1.549 m)   Wt 83.5 kg   SpO2 100%   BMI 34.77 kg/m   Physical Exam Vitals and nursing note reviewed.  Constitutional:      General: She is not in acute distress.    Appearance:  She is well-developed and well-nourished. She is not diaphoretic.     Comments: Nontoxic appearing, pleasant AA female  HENT:     Head: Normocephalic and atraumatic.  Eyes:     General: No scleral icterus.    Extraocular Movements: EOM normal.     Conjunctiva/sclera: Conjunctivae normal.  Cardiovascular:     Rate and Rhythm: Normal rate and regular rhythm.     Pulses: Normal pulses.  Pulmonary:     Effort: Pulmonary effort is normal. No respiratory distress.  Abdominal:     Comments: Abdomen soft, obese. Mild epigastric and LUQ TTP. No guarding, peritoneal signs.  Musculoskeletal:        General: Normal range of motion.     Cervical back: Normal range of motion.  Skin:    General: Skin is warm and dry.     Coloration: Skin is not pale.     Findings: No erythema or rash.  Neurological:     Mental Status: She is alert and oriented to person, place, and time.  Psychiatric:        Mood and Affect: Mood and affect normal.        Behavior: Behavior normal.     ED Results / Procedures / Treatments   Labs (all labs ordered are listed, but only abnormal results are displayed) Labs Reviewed  COMPREHENSIVE METABOLIC PANEL - Abnormal; Notable for the following components:      Result Value   ALT 45 (*)    All other components within normal limits  URINALYSIS, ROUTINE W REFLEX MICROSCOPIC - Abnormal; Notable for the following components:   APPearance CLOUDY (*)    Hgb urine dipstick MODERATE (*)    Bacteria, UA RARE (*)    All other components within normal limits  LIPASE, BLOOD  CBC  I-STAT BETA HCG BLOOD, ED ([facility], [facility], [facility] ONLY)    EKG EKG Interpretation  Date/Time:  Tuesday October 09 2020 12:26:13 EST Ventricular Rate:  84 PR Interval:    QRS Duration: 78 QT Interval:  362 QTC Calculation: 428 R Axis:   64 Text Interpretation: Sinus rhythm 12 Lead; Mason-Likar since last tracing no significant change Confirmed by Malvin Johns ([id number]) on 10/10/2020 9:05:11  AM   Radiology No results found.  Procedures Procedures (including critical care time)  Medications Ordered in ED Medications  acetaminophen (TYLENOL) tablet 650 mg (650 mg Oral Given 10/09/20 2150)  alum & mag  hydroxide-simeth (MAALOX/MYLANTA) 200-200-20 MG/5ML suspension 30 mL (30 mLs Oral Given 10/10/20 0139)    And  lidocaine (XYLOCAINE) 2 % viscous mouth solution 15 mL (15 mLs Oral Given 10/10/20 0139)  dicyclomine (BENTYL) capsule 10 mg (10 mg Oral Given 10/10/20 0139)    ED Course  I have reviewed the triage vital signs and the nursing notes.  Pertinent labs & imaging results that were available during my care of the patient were reviewed by me and considered in my medical decision making (see chart for details).    MDM Rules/Calculators/A&P                          35 year old female presenting for acute on chronic left upper quadrant abdominal pain.  She has been experiencing the symptoms intermittently for over a year.  Subjectively feels they have been worse over the past few weeks.  Patient was stable vital signs while in the emergency department.  Her laboratory evaluation was initiated in triage.  Findings have been reviewed and are normal, consistent with baseline.  An ultrasound of the patient's abdomen was performed.  This is without acute abnormality.  Patient with follow up appointment at the end of January. This is with her primary care doctor. She is requesting referral to GI which is reasonable and was provided.  Short course of Bentyl prescribed for symptomatic management as she had some relief with this and GI cocktail while in the ED.  Have encouraged her to continue a home bowel regimen including the use of MiraLAX as she self-reports history of constipation.  Discussed with patient that this may contribute to intermittent abdominal pain.  Return precautions discussed and provided. Patient discharged in stable condition with no unaddressed concerns.   Final Clinical  Impression(s) / ED Diagnoses Final diagnoses:  Left upper quadrant abdominal pain    Rx / DC Orders ED Discharge Orders         Ordered    dicyclomine (BENTYL) 20 MG tablet  Every 12 hours PRN        10/10/20 0146           Antonietta Breach, PA-C 10/27/20 [id number]    Mesner, Corene Cornea, MD 10/27/20 [phone]

## 2020-12-21 ENCOUNTER — Encounter: Payer: Self-pay | Admitting: Physician Assistant

## 2020-12-21 ENCOUNTER — Other Ambulatory Visit: Payer: Self-pay

## 2020-12-21 ENCOUNTER — Other Ambulatory Visit: Payer: Self-pay | Admitting: Physician Assistant

## 2020-12-21 ENCOUNTER — Ambulatory Visit (INDEPENDENT_AMBULATORY_CARE_PROVIDER_SITE_OTHER): Payer: 59 | Admitting: Physician Assistant

## 2020-12-21 VITALS — BP 100/68 | HR 88 | Ht 61.0 in | Wt 178.0 lb

## 2020-12-21 DIAGNOSIS — K625 Hemorrhage of anus and rectum: Secondary | ICD-10-CM | POA: Diagnosis not present

## 2020-12-21 DIAGNOSIS — R1012 Left upper quadrant pain: Secondary | ICD-10-CM | POA: Diagnosis not present

## 2020-12-21 DIAGNOSIS — K219 Gastro-esophageal reflux disease without esophagitis: Secondary | ICD-10-CM

## 2020-12-21 DIAGNOSIS — Z8379 Family history of other diseases of the digestive system: Secondary | ICD-10-CM

## 2020-12-21 DIAGNOSIS — K5909 Other constipation: Secondary | ICD-10-CM | POA: Diagnosis not present

## 2020-12-21 MED ORDER — OMEPRAZOLE 40 MG PO CPDR
40.0000 mg | DELAYED_RELEASE_CAPSULE | Freq: Two times a day (BID) | ORAL | 5 refills | Status: DC
  Filled 2021-06-07: qty 60, 30d supply, fill #0

## 2020-12-21 MED ORDER — LINACLOTIDE 72 MCG PO CAPS: 72.0000 ug | ORAL_CAPSULE | Freq: Every day | ORAL | 5 refills | Status: DC

## 2020-12-21 MED ORDER — CLENPIQ 10-3.5-12 MG-GM -GM/160ML PO SOLN: ORAL | 0 refills | Status: DC

## 2020-12-21 NOTE — Patient Instructions (Signed)
We have sent the following medications to your pharmacy for you to pick up at your convenience: Linzess 72 mg to take one tablet by mouth daily and omeprazole 40 mg one capsule by mouth twice daily.   Samples of Linzess 72 mcg have been provided.   You have been scheduled for an endoscopy and colonoscopy. Please follow the written instructions given to you at your visit today. Please pick up your prep supplies at the pharmacy within the next 1-3 days. If you use inhalers (even only as needed), please bring them with you on the day of your procedure.  Normal BMI (Body Mass Index- based on height and weight) is between 19 and 25. Your BMI today is Body mass index is 33.63 kg/m. Marland Kitchen Please consider follow up  regarding your BMI with your Primary Care Provider.

## 2020-12-21 NOTE — Progress Notes (Signed)
Chief Complaint: Follow-up after ED visit for abdominal pain  HPI:    Joan Knight is a 35 year old African-American female with a past medical history of DVT and sickle cell anemia, who was referred to me by Ladell Pier, MD for follow-up after being seen in the ER for abdominal pain.      07/24/2019 CT of the abdomen pelvis with contrast showed no central, segmental or subsegmental pulmonary embolism, 3 cm cyst within the right adnexa, no other acute intra-abdominal or pelvic pathology.    10/10/2020 patient seen in the ER for left upper quadrant abdominal pain which had lasted over the past few weeks, but also describes them chronically for a year.  Felt like her upper abdomen was "swollen".  Labs with an ALT of 45 and otherwise normal CMP, lipase and CBC.  Short course of Bentyl 20 mg every 12 hours was prescribed for symptomatic management since she had some relief with this and GI cocktail in the ED.  She was encouraged to continue home bowel regimen including the use of MiraLAX.    10/10/2020 ultrasound of the abdomen was normal.    Today, the patient tells me that over the past 10 years of her life she has had chronic issues with a left upper quadrant pain right up underneath her breast and rib cage which is there almost constantly but seems worse when she is constipated, some better when she passes a stool.  Describes chronic issues with constipation having a bowel movement every 4 to 6 days and that is "only little bits", and this is even after using a fleets enema daily which she has been doing for the past few months.  Tells me that she constantly feels full even if she has not eaten anything and has to lay on her left side to avoid increase in pain.  Has tried over-the-counter products including MiraLAX and Dulcolax which are of no help.  Associated symptoms include some occasional rectal bleeding.    Also describes constant reflux and heartburn symptoms.  Currently takes Omeprazole 20 mg  daily but still has breakthrough.    Family history of Crohn's in her mother.  Patient also admits to using 600 mg of Ibuprofen daily over the past few years for headache/abdominal pain.    Denies fever, chills, weight loss, nausea or vomiting.  Past Medical History:  Diagnosis Date  . DVT (deep venous thrombosis) ([place])    Had history of DVT in the right lower extremity about 5 years ago.  She tells me she was on blood thinner for about a year and then she stopped taking it.    . Sickle cell anemia ([place])   . Sickle cell trait [facility])     Past Surgical History:  Procedure Laterality Date  . REMOVAL OF IMPLANON ROD  09/26/2019    Current Outpatient Medications  Medication Sig Dispense Refill  . acetaminophen (TYLENOL) 500 MG tablet Take 1,000 mg by mouth every 6 (six) hours as needed for mild pain.    Marland Kitchen dicyclomine (BENTYL) 20 MG tablet Take 1 tablet (20 mg total) by mouth every 12 (twelve) hours as needed (for abdominal pain/cramping). 20 tablet 0  . ibuprofen (ADVIL,MOTRIN) 600 MG tablet Take 1 tablet (600 mg total) by mouth every 6 (six) hours as needed. (Patient taking differently: Take 600 mg by mouth every 6 (six) hours as needed for moderate pain.) 30 tablet 0  . omeprazole (PRILOSEC OTC) 20 MG tablet Take 20 mg by mouth  daily as needed (acid reflux).     No current facility-administered medications for this visit.    Allergies as of 12/21/2020  . (No Known Allergies)    Family History  Problem Relation Age of Onset  . Asthma Mother   . Anxiety disorder Mother   . Depression Mother   . Bronchitis Mother   . Emphysema Mother   . COPD Mother   . Deep vein thrombosis Father   . Hypertension Father   . Depression Father     Social History   Socioeconomic History  . Marital status: Single    Spouse name: Not on file  . Number of children: Not on file  . Years of education: Not on file  . Highest education level: Not on file  Occupational History  . Not on file   Tobacco Use  . Smoking status: Current Every Day Smoker    Packs/day: 0.25    Years: 14.00    Pack years: 3.50    Types: Cigarettes    Last attempt to quit: 08/27/2016    Years since quitting: 4.3  . Smokeless tobacco: Never Used  Substance and Sexual Activity  . Alcohol use: No  . Drug use: No  . Sexual activity: Yes    Birth control/protection: None  Other Topics Concern  . Not on file  Social History Narrative  . Not on file   Social Determinants of Health   Financial Resource Strain: Not on file  Food Insecurity: Not on file  Transportation Needs: Not on file  Physical Activity: Not on file  Stress: Not on file  Social Connections: Not on file  Intimate Partner Violence: Not on file    Review of Systems:    Constitutional: No weight loss, fever or chills Skin: No rash Cardiovascular: No chest pain Respiratory: No SOB  Gastrointestinal: See HPI and otherwise negative Genitourinary: No dysuria  Neurological: No headache, dizziness or syncope Musculoskeletal: No new muscle or joint pain Hematologic: No bruising Psychiatric: No history of depression or anxiety   Physical Exam:  Vital signs: BP 100/68 (BP Location: Left Arm, Patient Position: Sitting, Cuff Size: Normal)   Pulse 88   Ht 5\' 1"  (1.549 m)   Wt 178 lb (80.7 kg)   BMI 33.63 kg/m   Constitutional:   Pleasant overweight AA female appears to be in NAD, Well developed, Well nourished, alert and cooperative Head:  Normocephalic and atraumatic. Eyes:   PEERL, EOMI. No icterus. Conjunctiva pink. Ears:  Normal auditory acuity. Neck:  Supple Throat: Oral cavity and pharynx without inflammation, swelling or lesion.  Respiratory: Respirations even and unlabored. Lungs clear to auscultation bilaterally.   No wheezes, crackles, or rhonchi.  Cardiovascular: Normal S1, S2. No MRG. Regular rate and rhythm. No peripheral edema, cyanosis or pallor.  Gastrointestinal:  Soft, nondistended, mild LUQ ttp, No rebound or  guarding. Normal bowel sounds. No appreciable masses or hepatomegaly. Rectal:  Not performed.  Msk:  Symmetrical without gross deformities. Without edema, no deformity or joint abnormality.  Neurologic:  Alert and  oriented x4;  grossly normal neurologically.  Skin:   Dry and intact without significant lesions or rashes. Psychiatric: Demonstrates good judgement and reason without abnormal affect or behaviors.  RELEVANT LABS AND IMAGING: CBC    Component Value Date/Time   WBC 7.1 10/09/2020 1354   RBC 4.39 10/09/2020 1354   HGB 13.3 10/09/2020 1354   HGB 12.9 12/07/2019 1625   HCT 37.2 10/09/2020 1354   HCT 37.7 12/07/2019  1625   PLT 319 10/09/2020 1354   PLT 345 12/07/2019 1625   MCV 84.7 10/09/2020 1354   MCV 89 12/07/2019 1625   MCH 30.3 10/09/2020 1354   MCHC 35.8 10/09/2020 1354   RDW 14.5 10/09/2020 1354   RDW 13.6 12/07/2019 1625   LYMPHSABS 3.4 (H) 12/07/2019 1625   MONOABS 0.8 05/14/2019 1351   EOSABS 0.1 12/07/2019 1625   BASOSABS 0.1 12/07/2019 1625    CMP     Component Value Date/Time   NA 139 10/09/2020 1354   K 3.8 10/09/2020 1354   CL 105 10/09/2020 1354   CO2 25 10/09/2020 1354   GLUCOSE 85 10/09/2020 1354   BUN 11 10/09/2020 1354   CREATININE 0.55 10/09/2020 1354   CREATININE 0.79 03/07/2014 1548   CALCIUM 9.7 10/09/2020 1354   PROT 8.1 10/09/2020 1354   ALBUMIN 4.8 10/09/2020 1354   AST 30 10/09/2020 1354   ALT 45 (H) 10/09/2020 1354   ALKPHOS 50 10/09/2020 1354   BILITOT 0.4 10/09/2020 1354   GFRNONAA >60 10/09/2020 1354   GFRNONAA >89 03/07/2014 1548   GFRAA >60 07/24/2019 1711   GFRAA >89 03/07/2014 1548    Assessment: 1.  Chronic constipation: For the past 10 years, no real relief with any over-the-counter products; most likely IBS 2.  Left upper quadrant abdominal pain: Likely with above+/-gastritis 3.  GERD: Uncontrolled on omeprazole 20 mg daily 4.  Family history of Crohn's in her mother 58. Rectal bleeding  Plan: 1.  Discussed  approaching patient's issues conservatively but she is very nervous that she has been googling things.  She would like to go ahead with EGD and colonoscopy. 2.  Scheduled the patient for diagnostic EGD and colonoscopy in the [place] with Dr. Bryan Lemma.  Did provide the patient with a detailed list of risks for the procedures and she agrees to proceed. 3.  Prescribed Omeprazole 40 mg twice daily, 30-60 minutes for breakfast and dinner #60 with 5 refills. 4.  Prescribe Linzess 72 mcg daily, 30 minutes before a meal #30 with 5 refills. 5.  Did discuss patient's ibuprofen usage, would recommend that she use Tylenol instead. 6.  Patient to follow in clinic per recommendations of Dr. Bryan Lemma after time of procedures.  Joan Newer, PA-C [facility] 12/21/2020, 3:24 PM  Cc: Ladell Pier, MD

## 2020-12-24 MED FILL — OMEPRAZOLE DR 40 MG CAPSULE: 40 | 30 days supply | Qty: 60 | Fill #0

## 2020-12-24 MED FILL — LINZESS 72 MCG CAPSULE: 72 | 30 days supply | Qty: 30 | Fill #0

## 2020-12-24 MED FILL — CLENPIQ 10-3.5-12 MG-GM -GM: 10-3.5-12 M | 1 days supply | Qty: 320 | Fill #0

## 2020-12-27 NOTE — Progress Notes (Signed)
Agree with the assessment and plan as outlined by Jennifer Lemmon, PA-C. ? ?Vito Cirigliano, DO, FACG ? ?

## 2020-12-31 MED FILL — LINZESS 72 MCG CAPSULE: 72 | 30 days supply | Qty: 30 | Fill #0

## 2020-12-31 MED FILL — OMEPRAZOLE DR 40 MG CAPSULE: 40 | 30 days supply | Qty: 60 | Fill #0

## 2021-01-31 ENCOUNTER — Other Ambulatory Visit: Payer: Self-pay

## 2021-01-31 ENCOUNTER — Ambulatory Visit (AMBULATORY_SURGERY_CENTER): Payer: 59 | Admitting: Gastroenterology

## 2021-01-31 ENCOUNTER — Encounter: Payer: Self-pay | Admitting: Gastroenterology

## 2021-01-31 VITALS — BP 121/81 | HR 76 | Temp 98.1°F | Resp 18 | Ht 61.0 in | Wt 178.0 lb

## 2021-01-31 DIAGNOSIS — K641 Second degree hemorrhoids: Secondary | ICD-10-CM

## 2021-01-31 DIAGNOSIS — K5909 Other constipation: Secondary | ICD-10-CM | POA: Diagnosis present

## 2021-01-31 DIAGNOSIS — D123 Benign neoplasm of transverse colon: Secondary | ICD-10-CM

## 2021-01-31 DIAGNOSIS — K2970 Gastritis, unspecified, without bleeding: Secondary | ICD-10-CM

## 2021-01-31 DIAGNOSIS — K635 Polyp of colon: Secondary | ICD-10-CM

## 2021-01-31 DIAGNOSIS — R1012 Left upper quadrant pain: Secondary | ICD-10-CM

## 2021-01-31 DIAGNOSIS — B9681 Helicobacter pylori [H. pylori] as the cause of diseases classified elsewhere: Secondary | ICD-10-CM | POA: Diagnosis not present

## 2021-01-31 DIAGNOSIS — K2950 Unspecified chronic gastritis without bleeding: Secondary | ICD-10-CM | POA: Diagnosis not present

## 2021-01-31 DIAGNOSIS — K921 Melena: Secondary | ICD-10-CM

## 2021-01-31 DIAGNOSIS — K625 Hemorrhage of anus and rectum: Secondary | ICD-10-CM

## 2021-01-31 DIAGNOSIS — K219 Gastro-esophageal reflux disease without esophagitis: Secondary | ICD-10-CM

## 2021-01-31 MED ORDER — SODIUM CHLORIDE 0.9 % IV SOLN
500.0000 mL | Freq: Once | INTRAVENOUS | Status: DC
Start: 2021-01-31 — End: 2021-01-31

## 2021-01-31 NOTE — Progress Notes (Signed)
Called to room to assist during endoscopic procedure.  Patient ID and intended procedure confirmed with present staff. Received instructions for my participation in the procedure from the performing physician.  

## 2021-01-31 NOTE — Progress Notes (Signed)
Vs by CW in adm 

## 2021-01-31 NOTE — Patient Instructions (Signed)
Handouts given for Polyps, hemorrhoids and hemorrhoid banding.  Await pathology results.  Use a fiber supplement like Citrucel, Fibercon, metamucil or konsyl.  Drink extra water when taking fiber.  YOU HAD AN ENDOSCOPIC PROCEDURE TODAY AT [facility]:   Refer to the procedure report that was given to you for any specific questions about what was found during the examination.  If the procedure report does not answer your questions, please call your gastroenterologist to clarify.  If you requested that your care partner not be given the details of your procedure findings, then the procedure report has been included in a sealed envelope for you to review at your convenience later.  YOU SHOULD EXPECT: Some feelings of bloating in the abdomen. Passage of more gas than usual.  Walking can help get rid of the air that was put into your GI tract during the procedure and reduce the bloating. If you had a lower endoscopy (such as a colonoscopy or flexible sigmoidoscopy) you may notice spotting of blood in your stool or on the toilet paper. If you underwent a bowel prep for your procedure, you may not have a normal bowel movement for a few days.  Please Note:  You might notice some irritation and congestion in your nose or some drainage.  This is from the oxygen used during your procedure.  There is no need for concern and it should clear up in a day or so.  SYMPTOMS TO REPORT IMMEDIATELY:   Following lower endoscopy (colonoscopy or flexible sigmoidoscopy):  Excessive amounts of blood in the stool  Significant tenderness or worsening of abdominal pains  Swelling of the abdomen that is new, acute  Fever of 100F or higher   Following upper endoscopy (EGD)  Vomiting of blood or coffee ground material  New chest pain or pain under the shoulder blades  Painful or persistently difficult swallowing  New shortness of breath  Fever of 100F or higher  Black, tarry-looking stools  For  urgent or emergent issues, a gastroenterologist can be reached at any hour by calling [phone]. Do not use MyChart messaging for urgent concerns.    DIET:  We do recommend a small meal at first, but then you may proceed to your regular diet.  Drink plenty of fluids but you should avoid alcoholic beverages for 24 hours.  ACTIVITY:  You should plan to take it easy for the rest of today and you should NOT DRIVE or use heavy machinery until tomorrow (because of the sedation medicines used during the test).    FOLLOW UP: Our staff will call the number listed on your records 48-72 hours following your procedure to check on you and address any questions or concerns that you may have regarding the information given to you following your procedure. If we do not reach you, we will leave a message.  We will attempt to reach you two times.  During this call, we will ask if you have developed any symptoms of COVID 19. If you develop any symptoms (ie: fever, flu-like symptoms, shortness of breath, cough etc.) before then, please call [phone].  If you test positive for Covid 19 in the 2 weeks post procedure, please call and report this information to [place].    If any biopsies were taken you will be contacted by phone or by letter within the next 1-3 weeks.  Please call us at [phone] if you have not heard about the biopsies in 3 weeks.  SIGNATURES/CONFIDENTIALITY: You and/or your care partner have signed paperwork which will be entered into your electronic medical record.  These signatures attest to the fact that that the information above on your After Visit Summary has been reviewed and is understood.  Full responsibility of the confidentiality of this discharge information lies with you and/or your care-partner.

## 2021-01-31 NOTE — Op Note (Signed)
[facility] Patient Name: Joan Knight Procedure Date: 01/31/2021 2:47 PM MRN: [id number] Endoscopist: Gerrit Heck , MD Age: 35 Referring MD:  Date of Birth: 1986/07/30 Gender: Female Account #: [id number] Procedure:                Upper GI endoscopy Indications:              Abdominal pain in the left upper quadrant,                            Suspected esophageal reflux Medicines:                Monitored Anesthesia Care Procedure:                Pre-Anesthesia Assessment:                           - Prior to the procedure, a History and Physical                            was performed, and patient medications and                            allergies were reviewed. The patient's tolerance of                            previous anesthesia was also reviewed. The risks                            and benefits of the procedure and the sedation                            options and risks were discussed with the patient.                            All questions were answered, and informed consent                            was obtained. Prior Anticoagulants: The patient has                            taken no previous anticoagulant or antiplatelet                            agents. ASA Grade Assessment: II - A patient with                            mild systemic disease. After reviewing the risks                            and benefits, the patient was deemed in                            satisfactory condition to undergo the procedure.  After obtaining informed consent, the endoscope was                            passed under direct vision. Throughout the                            procedure, the patient's blood pressure, pulse, and                            oxygen saturations were monitored continuously. The                            Endoscope was introduced through the mouth, and                            advanced to the second part of  duodenum. The upper                            GI endoscopy was accomplished without difficulty.                            The patient tolerated the procedure well. Scope In: Scope Out: Findings:                 The examined esophagus was normal.                           The Z-line was regular and was found 35 cm from the                            incisors.                           The entire examined stomach was normal. Biopsies                            were taken with a cold forceps for Helicobacter                            pylori testing. Estimated blood loss was minimal.                           The examined duodenum was normal. Complications:            No immediate complications. Estimated Blood Loss:     Estimated blood loss was minimal. Impression:               - Normal esophagus.                           - Z-line regular, 35 cm from the incisors.                           - Normal stomach. Biopsied.                           -  Normal examined duodenum. Recommendation:           - Patient has a contact number available for                            emergencies. The signs and symptoms of potential                            delayed complications were discussed with the                            patient. Return to normal activities tomorrow.                            Written discharge instructions were provided to the                            patient.                           - Resume previous diet.                           - Continue present medications.                           - Await pathology results. Gerrit Heck, MD 01/31/2021 3:19:00 PM

## 2021-01-31 NOTE — Progress Notes (Signed)
A/ox3, pleased with MAC, report to RN 

## 2021-01-31 NOTE — Op Note (Signed)
[place] Patient Name: Joan Knight Procedure Date: 01/31/2021 2:47 PM MRN: [id number] Endoscopist: Gerrit Heck , MD Age: 35 Referring MD:  Date of Birth: 1986/07/21 Gender: Female Account #: [id number] Procedure:                Colonoscopy Indications:              Hematochezia, Constipation Medicines:                Monitored Anesthesia Care Procedure:                Pre-Anesthesia Assessment:                           - Prior to the procedure, a History and Physical                            was performed, and patient medications and                            allergies were reviewed. The patient's tolerance of                            previous anesthesia was also reviewed. The risks                            and benefits of the procedure and the sedation                            options and risks were discussed with the patient.                            All questions were answered, and informed consent                            was obtained. Prior Anticoagulants: The patient has                            taken no previous anticoagulant or antiplatelet                            agents. ASA Grade Assessment: II - A patient with                            mild systemic disease. After reviewing the risks                            and benefits, the patient was deemed in                            satisfactory condition to undergo the procedure.                           After obtaining informed consent, the colonoscope  was passed under direct vision. Throughout the                            procedure, the patient's blood pressure, pulse, and                            oxygen saturations were monitored continuously. The                            Olympus [id number]) [id number] was introduced                            through the anus and advanced to the the cecum,                            identified by appendiceal orifice  and ileocecal                            valve. The colonoscopy was performed without                            difficulty. The patient tolerated the procedure                            well. The quality of the bowel preparation was                            good. The ileocecal valve, appendiceal orifice, and                            rectum were photographed. Scope In: 3:01:22 PM Scope Out: 3:16:18 PM Scope Withdrawal Time: 0 hours 12 minutes 28 seconds  Total Procedure Duration: 0 hours 14 minutes 56 seconds  Findings:                 Hemorrhoids were found on perianal exam.                           Two sessile polyps were found in the transverse                            colon. The polyps were 2 to 5 mm in size. These                            polyps were removed with a cold snare. Resection                            and retrieval were complete. Estimated blood loss                            was minimal.                           Non-bleeding internal hemorrhoids were found during  retroflexion. The hemorrhoids were small and Grade                            II (internal hemorrhoids that prolapse but reduce                            spontaneously).                           The exam was otherwise normal throughout the                            examined colon. Complications:            No immediate complications. Estimated Blood Loss:     Estimated blood loss was minimal. Impression:               - Hemorrhoids found on perianal exam.                           - Two 2 to 5 mm polyps in the transverse colon,                            removed with a cold snare. Resected and retrieved.                           - Non-bleeding internal hemorrhoids. Recommendation:           - Patient has a contact number available for                            emergencies. The signs and symptoms of potential                            delayed complications were  discussed with the                            patient. Return to normal activities tomorrow.                            Written discharge instructions were provided to the                            patient.                           - Resume previous diet.                           - Continue present medications.                           - Await pathology results.                           - Repeat colonoscopy for surveillance based on  pathology results.                           - Return to GI office PRN.                           - Use fiber, for example Citrucel, Fibercon, Konsyl                            or Metamucil.                           - Internal hemorrhoids were noted on this study and                            may be amenable to hemorrhoid band ligation. If you                            are interested in further treatment of these                            hemorrhoids with band ligation, please contact my                            clinic to set up an appointment for evaluation and                            treatment. Gerrit Heck, MD 01/31/2021 3:21:52 PM

## 2021-02-04 ENCOUNTER — Telehealth: Payer: Self-pay

## 2021-02-04 ENCOUNTER — Ambulatory Visit (INDEPENDENT_AMBULATORY_CARE_PROVIDER_SITE_OTHER): Payer: 59

## 2021-02-04 ENCOUNTER — Other Ambulatory Visit: Payer: Self-pay

## 2021-02-04 DIAGNOSIS — Z3200 Encounter for pregnancy test, result unknown: Secondary | ICD-10-CM

## 2021-02-04 DIAGNOSIS — Z3201 Encounter for pregnancy test, result positive: Secondary | ICD-10-CM

## 2021-02-04 LAB — POCT PREGNANCY, URINE: Preg Test, Ur: POSITIVE — AB

## 2021-02-04 NOTE — Progress Notes (Signed)
Patient was assessed and managed by nursing staff during this encounter. I have reviewed the chart and agree with the documentation and plan. I have also made any necessary editorial changes.  Griffin Basil, MD 02/04/2021 2:17 PM

## 2021-02-04 NOTE — Telephone Encounter (Signed)
Juliann Mule is not here any longer.

## 2021-02-04 NOTE — Telephone Encounter (Signed)
  Follow up Call-  Call back number 01/31/2021  Post procedure Call Back phone  # [phone]  Permission to leave phone message Yes  Some recent data might be hidden     Patient questions:  Do you have a fever, pain , or abdominal swelling? No. Pain Score  0 *  Have you tolerated food without any problems? yes  Have you been able to return to your normal activities? Yes.    Do you have any questions about your discharge instructions: Diet   No. Medications  Yes.   Follow up visit  No.  Do you have questions or concerns about your Care? No.  Actions: * If pain score is 4 or above: No action needed, pain <4.  Pt has question and asked if the procedure could have any harm to her pregnancy as she did a home pregnancy test and it was positive, let pt know I will let Dr Bryan Lemma know and asked that she contact her ob/gyn for a test, pt does not have one but said she called family planning to schedule a pregnancy test.  Per [facility] Monday CRNA, propofol is not harmful during pregnancy.    1. Have you developed a fever since your procedure? no  2.   Have you had an respiratory symptoms (SOB or cough) since your procedure? no  3.   Have you tested positive for COVID 19 since your procedure no  4.   Have you had any family members/close contacts diagnosed with the COVID 19 since your procedure?  no   If yes to any of these questions please route to Joylene John, RN and Joella Prince, RN

## 2021-02-04 NOTE — Telephone Encounter (Signed)
Call to pt, to left her a message that Dr Bryan Lemma agreed with CRNA, no issues with procedure if pt is pregnant. Pt to call back if any further questions.

## 2021-02-04 NOTE — Progress Notes (Signed)
Pt left urine sample in office for UPT; result is positive. Called pt with results. Pt reports positive home UPT x3 yesterday. Pt is 4w 3d today based on LMP of 01/04/21; EDD is 10/11/21. This is patient's first pregnancy. She is an established pt with [facility] and would like to have prenatal care with our office. Meds and allergies reviewed; per Elgie Congo, MD it would be preferred for pt to discontinue Linzess.   Pt reports smoking tobacco products daily; pt expresses intent to quit due to pregnancy. Quitline [place] information given.  Pt no longer taking Zoloft because she did not like the way it made her feel. Pt interested in seeing [facility] and possibly trying a different medication. [place] office notified to schedule [facility] follow up appt and new OB appts.   Apolonio Schneiders RN 02/04/21

## 2021-02-08 NOTE — BH Specialist Note (Signed)
Integrated Behavioral Health via Telemedicine Visit  02/08/2021 [name] [id number]  Number of [place] visits: 2 Session Start time: 9:00  Session End time: 9:14 Total time: 14  Referring Provider: Gaylan Gerold, CNM Patient/Family location: Work (short-staffed; needs to have brief phone visit only) [facility] Provider location: Center for [employer] [facility] [employer] [facility]  All persons participating in visit: [name] and [name]   Types of Service: Telephone visit  I connected with [name] and/or [place] via  Animal nutritionist  (Video is Tree surgeon) and verified that I am speaking with the correct person using two identifiers. Discussed confidentiality: Yes   I discussed the limitations of telemedicine and the availability of in person appointments.  Discussed there is a possibility of technology failure and discussed alternative modes of communication if that failure occurs.  I discussed that engaging in this telemedicine visit, they consent to the provision of behavioral healthcare and the services will be billed under their insurance.  Patient and/or legal guardian expressed understanding and consented to Telemedicine visit: Yes   Presenting Concerns: Patient and/or family reports the following symptoms/concerns: Pt states her primary concern today is an increase in anxiety, worry, fatigue, lack of quality sleep, attributes increase to worry over first pregnancy; pt also feeling "bloated, acid reflux", "overeating", "tossing/turning at night, hot, mind racing". Pt is requesting BH medication other than Zoloft, as it made her feel "dizzy, sleepy, and nausea" when she took in 2021 for two weeks; uses relaxation breathing to cope.  Duration of problem: Increase with positive pregnancy; Severity of problem: moderately severe  Patient and/or Family's  Strengths/Protective Factors: Sense of purpose  Goals Addressed: Patient will: 1.  Reduce symptoms of: anxiety, depression and insomnia  2.  Increase knowledge and/or ability of: healthy habits  3.  Demonstrate ability to: Increase healthy adjustment to current life circumstances  Progress towards Goals: Ongoing  Interventions: Interventions utilized:  Medication Monitoring and Psychoeducation and/or Health Education Standardized Assessments completed: GAD-7 and PHQ 9  Patient and/or Family Response: Pt agrees to treatment plan  Assessment: Patient currently experiencing Major depressive disorder, recurrent, moderate, and Anxiety disorder, unspecified.   Patient may benefit from psychoeducation and brief therapeutic interventions regarding coping with symptoms of anxiety, depression, insomnia, stress .  Plan: 1. Follow up with behavioral health clinician on : Two weeks; call Roselyn Reef at [phone] as needed prior to scheduled visit 2. Behavioral recommendations:  -Continue taking prenatal vitamin daily -Continue using relaxation breathing daily to help manage emotions -Know that medical provider will be informed about need for [facility] medication in pregnancy; if any changes in medication, you will be contacted by either phone or MyChart message -Consider prioritizing healthy sleep as much as possible the next two weeks 3. Referral(s): [place] (In Clinic)  I discussed the assessment and treatment plan with the patient and/or parent/guardian. They were provided an opportunity to ask questions and all were answered. They agreed with the plan and demonstrated an understanding of the instructions.   They were advised to call back or seek an in-person evaluation if the symptoms worsen or if the condition fails to improve as anticipated.  [name], LCSW   Depression screen [facility] 2/9 02/15/2021 03/08/2020 09/28/2019 09/26/2019 08/05/2019  Decreased Interest  1 3 3  0 -  Down, Depressed, Hopeless 1 1 3 1 3   PHQ - 2 Score 2 4 6 1  3  Altered sleeping 3 2 3 2 2   Tired, decreased energy 3 3 3 2 3   Change in appetite 3 0 0 0 0  Feeling bad or failure about yourself  1 2 3 2 3   Trouble concentrating 3 1 3  0 1  Moving slowly or fidgety/restless 0 3 3 2  0  Suicidal thoughts 0 0 0 0 3  PHQ-9 Score 15 15 21 9 15    GAD 7 : Generalized Anxiety Score 02/15/2021 03/08/2020 09/28/2019 09/26/2019  Nervous, Anxious, on Edge 3 2 3 2   Control/stop worrying 3 2 3 2   Worry too much - different things 3 3 3 2   Trouble relaxing 3 2 3 2   Restless 3 2 3 1   Easily annoyed or irritable 3 3 3 2   Afraid - awful might happen 1 0 0 0  Total GAD 7 Score 19 14 18  11

## 2021-02-15 ENCOUNTER — Ambulatory Visit (INDEPENDENT_AMBULATORY_CARE_PROVIDER_SITE_OTHER): Payer: 59 | Admitting: Clinical

## 2021-02-15 ENCOUNTER — Telehealth: Payer: Self-pay | Admitting: General Surgery

## 2021-02-15 DIAGNOSIS — F419 Anxiety disorder, unspecified: Secondary | ICD-10-CM

## 2021-02-15 DIAGNOSIS — Z3A00 Weeks of gestation of pregnancy not specified: Secondary | ICD-10-CM

## 2021-02-15 DIAGNOSIS — O99340 Other mental disorders complicating pregnancy, unspecified trimester: Secondary | ICD-10-CM | POA: Diagnosis not present

## 2021-02-15 DIAGNOSIS — F339 Major depressive disorder, recurrent, unspecified: Secondary | ICD-10-CM | POA: Diagnosis not present

## 2021-02-15 NOTE — Telephone Encounter (Signed)
Left a message on the patients voicemail for her to contact us regarding bx results.

## 2021-02-15 NOTE — Patient Instructions (Signed)
[facility] [address] [place], [place] [address] [phone] (main office) [phone] (Andres Vest's office)   

## 2021-02-15 NOTE — Telephone Encounter (Signed)
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  These were removed entirely at the time of colonoscopy.  Recommend repeat colonoscopy in 5 years for ongoing polyp surveillance.  -The biopsies from the recent upper GI Endoscopy were notable for H. Pylori gastritis, and will plan on treating with quad therapy as below. Please confirm no medication allergies to the prescribed regimen.   1) Omeprazole 20 mg 2 times a day x 14 d 2) Pepto Bismol 2 tabs (262 mg each) 4 times a day x 14 d 3) Metronidazole 250 mg 4 times a day x 14 d 4) doxycycline 100 mg 2 times a day x 14 d  After 14 days, ok to stop omeprazole.  4 weeks after treatment completed, check H. Pylori stool antigen to confirm eradication (must be off acid suppression therapy)  Dx: H. Pylori gastritis

## 2021-02-18 NOTE — BH Specialist Note (Signed)
Integrated Behavioral Health via Telemedicine Visit  02/18/2021 Joan Knight [id number]  Number of [place] visits: 2 Session Start time: 10:19  Session End time: 10:43 Total time: 40   Referring Provider: Gaylan Gerold, CNM Patient/Family location: Work/[employer] Provider location: [facility] [place] [facility]  All persons participating in visit: [name] and [place]   Types of Service: Telephone visit  I connected with [name] and/or [place] via  Telephone or Geologist, engineering  (Video is Tree surgeon) and verified that I am speaking with the correct person using two identifiers. Discussed confidentiality: Yes   I discussed the limitations of telemedicine and the availability of in person appointments.  Discussed there is a possibility of technology failure and discussed alternative modes of communication if that failure occurs.  I discussed that engaging in this telemedicine visit, they consent to the provision of behavioral healthcare and the services will be billed under their insurance.  Patient and/or legal guardian expressed understanding and consented to Telemedicine visit: Yes   Presenting Concerns: Patient and/or family reports the following symptoms/concerns: Pt states her primary concern today is increase in anxiety and "a lot of crying", attributed to her disabled mother's eviction, due to landlord no longer accepting section 8. Pt's goal today is to help her mother find housing asap.  Duration of problem: Increase in over 3 weeks; Severity of problem: moderately severe  Patient and/or Family's Strengths/Protective Factors: Social connections, Concrete supports in place (healthy food, safe environments, etc.) and Sense of purpose  Goals Addressed: Patient will: 1.  Reduce symptoms of: anxiety, compulsions and stress  2.   Increase knowledge and/or ability of: stress reduction  3.  Demonstrate ability to: Increase healthy adjustment to current life circumstances  Progress towards Goals: Ongoing  Interventions: Interventions utilized:  Solution-Focused Strategies Standardized Assessments completed: Not Needed  Patient and/or Family Response: Pt agrees to treatment plan  Assessment: Patient currently experiencing Major depressive disorder, recurent, moderate, Anxiety disorder, unspecified and Psychosocial stress.   Patient may benefit from continued psychoeducation and brief therapeutic interventions regarding coping with symptoms of anxiety, depression, life stress .  Plan: 1. Follow up with behavioral health clinician on : One month 2. Behavioral recommendations:  -Continue taking prenatal vitamin and Zoloft as prescribed; consider GABA as recommended by medical provider -Continue using relaxation breathing daily during this stressful time for ongoing self-care -Consider housing resources (on After Visit Summary) to share with mother, as needed 3. Referral(s): [place] (In Clinic) and [employer]:  Housing  I discussed the assessment and treatment plan with the patient and/or parent/guardian. They were provided an opportunity to ask questions and all were answered. They agreed with the plan and demonstrated an understanding of the instructions.   They were advised to call back or seek an in-person evaluation if the symptoms worsen or if the condition fails to improve as anticipated.  Caroleen Hamman Zosia Lucchese, LCSW

## 2021-02-19 NOTE — Telephone Encounter (Signed)
No, since she was without ulcers and her inflammation was very mild on endoscopy, plan to defer H. pylori treatment until after her pregnancy as several of the antibiotics used in H. pylori treatment can cross the placenta.  Thank you for the update.

## 2021-02-22 NOTE — Telephone Encounter (Signed)
Inbound call from patient returning your call. Best contact number is [phone]

## 2021-02-22 NOTE — Telephone Encounter (Signed)
Spoke with the patient, due to pregnancy and the abx treatment crossing the placenta barrier we will need to wait until the baby is born. Patient was concerned but I reassured her that this can wait until after her pregnancy. Patient verbalized understanding and will contact [place] after the baby is born.

## 2021-02-22 NOTE — Telephone Encounter (Signed)
Tried to contact the patient to go over her results and talk about treatment and being pregnant. No voicemail is set up now. Will send letter to patient

## 2021-03-01 ENCOUNTER — Ambulatory Visit (INDEPENDENT_AMBULATORY_CARE_PROVIDER_SITE_OTHER): Payer: 59 | Admitting: Clinical

## 2021-03-01 DIAGNOSIS — F339 Major depressive disorder, recurrent, unspecified: Secondary | ICD-10-CM

## 2021-03-01 DIAGNOSIS — Z658 Other specified problems related to psychosocial circumstances: Secondary | ICD-10-CM | POA: Diagnosis not present

## 2021-03-01 DIAGNOSIS — F419 Anxiety disorder, unspecified: Secondary | ICD-10-CM

## 2021-03-01 NOTE — Patient Instructions (Signed)
Center for [facility] at [facility] for Women [place], [place] [address] [phone] (main office) [phone] [facility] office)  [place] (serves [place], [place], [place], [place], [place], [place], [place], [place], [place], [place], [place], [place], and [place] counties) [address] [place], [place] [address] [phone] http://[email or web address]-[email or web address]  **Rental assistance, Home Rehabilitation,Weatherization Assistance Program, Forensic psychologist, Housing Voucher Program  [employer] for Housing and [employer] Studies: Chief Financial Officer Resources to residents of [place], [place], and [place] 1. Make sure you have your documents ready, including:  Marland Kitchen (Household income verification: 2 months pay stubs, unemployment/social security award letter, statement of no income for all household members over 52) . Photo ID for all household members over 41 . Utility Bill/Rent Ledger/Lease: must show past due amount for utilities/rent, or the rental agreement if rent is current 2. Start your application online or by paper (in [place] or [place]) at:     http://boyd-evans.org/  3. Once you have completed the online application, you will get an email confirmation message from the county. Expect to hear back by phone or email at least 6-10 weeks from submitting your application.  4. While you wait:  . Call [phone] to check in on your application . Let your landlord know that you've applied. Your landlord will be asked to submit documents (W-9) during this application process. Payments will be made directly to the landlord/property [place] or utility assistance for [employer], [place], and [place] . Apply at [email or web address]://rb.gy/dvxbfv . Questions? Call or  email Renee at [phone] or [email or web address]   Eviction Mediation Program: The HOPE Program Https://www.rebuild.http://mills-williams.net/ HOPE Progam serves low-income renters in [place] counties, defined as less than or equal to 80% of the area median income for the county where the renter lives. In the following 12 counties, you should apply to your local rent and utility assistance program INSTEAD OF the HOPE Program: [place], [place], [place], [place], [place], [place], [place], [place], [place], [place], [place], [place]  . If you live outside of [facility], contact [place] call center at [phone] to talk to a Program Representative Monday-Friday, 8am-5pm . Note that Native American tribes also received federal funding for rent and utility assistance programs. Recognized members of the following tribes will be served by programs managed by tribal governments, including: [place], [place], [place], [place] of [place] and [place] . Eviction Mediation Coordinator, Renee, [phone] [email or web address] . Housing Resources Navigator, [place], [phone] [email or web address]@[email or web address] .[place] [address], [place], [place] [address] [phone] www.gha-[place].[facility] [address] [place], [place] [address] [phone] https://manning.com/ **Programs include: Engineer, building services and Housing Counseling, Healthy Doctor, general practice, Homeless Prevention and [place] [address] [place], [place], [place] [address] [phone] www.https://www.farmer-stevens.info/ **housing applications/recertification; tax payment relief/exemption under specific qualifications  [facility] [address], [place], [place]  [address] www.onlinegreensboro.com/~maryshouse **transitional housing for women in recovery who have minor children or are pregnant  [place], [place], [place] [address] ArtistMovie.se  **emergency shelter and support services for families facing homelessness  [place] [address], [place], [place] [address] [phone] www.youthfocus.org **transitional housing to pregnant women;  emergency housing for youth who have run away, are experiencing a family crisis, are victims of abuse or neglect, or are homeless  [facility] [address], [place], [place] [address] [phone] ircgso.org **Drop-in center for people experiencing homelessness; overnight warming center when temperature is 25 degrees or below  Re-Entry Staffing [place], [place], [place] [address] [phone] https://reentrystaffingagency.org/ **help with affordable housing to people experiencing homelessness or unemployment due to incarceration  [facility] [address], [place], [place] [address] [phone] www.greensborourbanministry.org  **emergency and transitional housing, rent/mortgage assistance, utility assistance  [facility] [address] [place], [place] [address] [phone] www.salvationarmyofgreensboro.org **emergency and transitional housing  [employer] for [employer] [place], [place], [place] [address] [phone] Www.habitatgreensboro.[place], [place], [place] [address] [phone] https://chshousing.org **[place] and [facility]  [employer] [address] [place] [address], [place], [place] [address] [phone] arrivance.com **home buyer education courses, foreclosure prevention  [facility] [address], [place], [place] [address] [phone] WirelessNovelties.no **Environmental Exposure Assessment (investigation of homes where either children or pregnant women with a confirmed elevated blood lead level reside)  [facility] [place], [employer], [place] [address] [phone] http://www.perez.com/ **Home Expense Assistance/Repairs Program; offers home accessibility updates, such as ramps or bars in the bathroom  [facility] [address], [place], [place] [address] [phone] https://www.self-help.org/locations/[place]-branch **Offers credit-building and banking services to people unable to use [place] of [facility] [place], [place], [place] [address] [phone] RoulettePays.com.br   [facility] [address], [place], [place] [address] [phone]  ClubMonetize.fr **housing applications/recertification, emergency and transitional housing  [employer] of [employer] [address], [place], [place] [address] [phone] www.odm-hp.org  **emergency and permanent housing; rent/mortgage payment assistance  Habitat for [employer], [place] and [place] [address], [place], [place] [address] [phone] ArchitectReviews.com.au  [facility] [place], [employer] [place], [place], [place] [address] www.familyservice-piedmont.org **emergency shelter for victims of domestic violence and sexual assault  [facility] [address], [place],  [address] [phone] www.senior-resources-guilford.org **Home expense assistance/repairs for older adults

## 2021-03-13 ENCOUNTER — Ambulatory Visit: Payer: Self-pay | Admitting: *Deleted

## 2021-03-13 NOTE — Telephone Encounter (Signed)
Mild spotting twice during the night. Patient is [redacted] weeks pregnant. This was after intercourse earlier. Denies abdomen pain/cramping, fever, dizziness. Discussed this could be normal after intercourse early in the pregnancy. Care advice included hold intercourse until 03/21/21 visit, no heavy lifting, no douching and do not use tampons. If period like bleeding occurs go the the ED. Reason for Disposition . SPOTTING after sexual intercourse (single or brief episode)  Answer Assessment - Initial Assessment Questions 1. ONSET: "When did this bleeding start?"       during the night 2. DESCRIPTION: "Describe the bleeding that you are having." "How much bleeding is there?"    - SPOTTING: spotting, or pinkish / brownish mucous discharge; does not fill panty liner or pad    - MILD:  less than 1 pad / hour; less than patient's usual menstrual bleeding   - MODERATE: 1-2 pads / hour; 1 menstrual cup every 6 hours; small-medium blood clots (e.g., pea, grape, small coin)   - SEVERE: soaking 2 or more pads/hour for 2 or more hours; 1 menstrual cup every 2 hours; bleeding not contained by pads or continuous red blood from vagina; large blood clots (e.g., golf ball, large coin)     Small spotting with voiding 3. ABDOMINAL PAIN SEVERITY: If present, ask: "How bad is it?"  (e.g., Scale 1-10; mild, moderate, or severe)   - MILD (1-3): doesn't interfere with normal activities, abdomen soft and not tender to touch    - MODERATE (4-7): interferes with normal activities or awakens from sleep, abdomen tender to touch    - SEVERE (8-10): excruciating pain, doubled over, unable to do any normal activities     No abdominal pain4. PREGNANCY: "Do you know how many weeks or months pregnant you are?" "When was the first day of your last normal menstrual period?"   01/04/21 5. HEMODYNAMIC STATUS: "Are you weak or feeling lightheaded?" If Yes, ask: "Can you stand and walk normally?"      None of the above 6. OTHER SYMPTOMS: "What  other symptoms are you having with the bleeding?" (e.g., passed tissue, vaginal discharge, fever, menstrual-type cramps)     None of the above  Protocols used: PREGNANCY - VAGINAL BLEEDING LESS THAN [redacted] WEEKS EGA-A-AH

## 2021-03-14 NOTE — BH Assessment (Deleted)
Integrated Behavioral Health via Telemedicine Visit  03/14/2021 Joan Knight [id number]  Number of [place] visits: *** Session Start time: 10:15***  Session End time: 10:45*** Total time: {IBH Total XFGH:82993716}  Referring Provider: *** Patient/Family location: Home*** [facility] Provider location: Center for [place] [facility]  All persons participating in visit: Patient *** and Joan Knight ***  Types of Service: {CHL AMB TYPE OF [phone]}  I connected with Joan Knight and/or Joan Knight's {family members:20773} via  Telephone or Geologist, engineering  (Video is Tree surgeon) and verified that I am speaking with the correct person using two identifiers. Discussed confidentiality: {YES/NO:21197}  I discussed the limitations of telemedicine and the availability of in person appointments.  Discussed there is a possibility of technology failure and discussed alternative modes of communication if that failure occurs.  I discussed that engaging in this telemedicine visit, they consent to the provision of behavioral healthcare and the services will be billed under their insurance.  Patient and/or legal guardian expressed understanding and consented to Telemedicine visit: {YES/NO:21197}  Presenting Concerns: Patient and/or family reports the following symptoms/concerns: *** Duration of problem: ***; Severity of problem: {Mild/Moderate/Severe:20260}  Patient and/or Family's Strengths/Protective Factors: {CHL AMB BH PROTECTIVE FACTORS:[phone]}  Goals Addressed: Patient will:  Reduce symptoms of: {IBH Symptoms:21014056}   Increase knowledge and/or ability of: {IBH Patient Tools:21014057}   Demonstrate ability to: {IBH Goals:21014053}  Progress towards Goals: {CHL AMB BH PROGRESS TOWARDS [phone]}  Interventions: Interventions utilized:  {IBH  Interventions:21014054} Standardized Assessments completed: {IBH Screening Tools:21014051}  Patient and/or Family Response: ***  Assessment: Patient currently experiencing ***.   Patient may benefit from ***.  Plan: Follow up with behavioral health clinician on : *** Behavioral recommendations: *** Referral(s): {IBH Referrals:21014055}  I discussed the assessment and treatment plan with the patient and/or parent/guardian. They were provided an opportunity to ask questions and all were answered. They agreed with the plan and demonstrated an understanding of the instructions.   They were advised to call back or seek an in-person evaluation if the symptoms worsen or if the condition fails to improve as anticipated.  Caroleen Hamman Maudean Hoffmann, LCSW

## 2021-03-15 ENCOUNTER — Inpatient Hospital Stay (HOSPITAL_COMMUNITY)
Admission: AD | Admit: 2021-03-15 | Discharge: 2021-03-15 | Disposition: A | Discharge status: Home / Self Care | Payer: 59 | Attending: Obstetrics and Gynecology | Admitting: Obstetrics and Gynecology

## 2021-03-15 ENCOUNTER — Inpatient Hospital Stay (HOSPITAL_COMMUNITY): Payer: 59

## 2021-03-15 ENCOUNTER — Telehealth: Payer: Self-pay | Admitting: Lactation Services

## 2021-03-15 ENCOUNTER — Other Ambulatory Visit: Payer: Self-pay

## 2021-03-15 ENCOUNTER — Telehealth: Payer: Self-pay

## 2021-03-15 ENCOUNTER — Encounter (HOSPITAL_COMMUNITY): Payer: Self-pay | Admitting: Obstetrics and Gynecology

## 2021-03-15 DIAGNOSIS — Z79899 Other long term (current) drug therapy: Secondary | ICD-10-CM | POA: Diagnosis not present

## 2021-03-15 DIAGNOSIS — Z3A10 10 weeks gestation of pregnancy: Secondary | ICD-10-CM | POA: Diagnosis not present

## 2021-03-15 DIAGNOSIS — O3680X0 Pregnancy with inconclusive fetal viability, not applicable or unspecified: Secondary | ICD-10-CM | POA: Diagnosis not present

## 2021-03-15 DIAGNOSIS — O209 Hemorrhage in early pregnancy, unspecified: Secondary | ICD-10-CM | POA: Diagnosis present

## 2021-03-15 DIAGNOSIS — Z86718 Personal history of other venous thrombosis and embolism: Secondary | ICD-10-CM | POA: Insufficient documentation

## 2021-03-15 DIAGNOSIS — O200 Threatened abortion: Secondary | ICD-10-CM | POA: Insufficient documentation

## 2021-03-15 HISTORY — DX: Dermatitis, unspecified: L30.9

## 2021-03-15 HISTORY — DX: Unspecified abnormal cytological findings in specimens from vagina: R87.629

## 2021-03-15 HISTORY — DX: Depression, unspecified: F32.A

## 2021-03-15 LAB — CBC WITH DIFFERENTIAL/PLATELET
Abs Immature Granulocytes: 0.02 10*3/uL (ref 0.00–0.07)
Basophils Absolute: 0.1 10*3/uL (ref 0.0–0.1)
Basophils Relative: 1 %
Eosinophils Absolute: 0.2 10*3/uL (ref 0.0–0.5)
Eosinophils Relative: 2 %
HCT: 34.5 % — ABNORMAL LOW (ref 36.0–46.0)
Hemoglobin: 12.5 g/dL (ref 12.0–15.0)
Immature Granulocytes: 0 %
Lymphocytes Relative: 43 %
Lymphs Abs: 3.1 10*3/uL (ref 0.7–4.0)
MCH: 30.1 pg (ref 26.0–34.0)
MCHC: 36.2 g/dL — ABNORMAL HIGH (ref 30.0–36.0)
MCV: 83.1 fL (ref 80.0–100.0)
Monocytes Absolute: 0.7 10*3/uL (ref 0.1–1.0)
Monocytes Relative: 9 %
Neutro Abs: 3.3 10*3/uL (ref 1.7–7.7)
Neutrophils Relative %: 45 %
Platelets: 331 10*3/uL (ref 150–400)
RBC: 4.15 MIL/uL (ref 3.87–5.11)
RDW: 13.4 % (ref 11.5–15.5)
WBC: 7.3 10*3/uL (ref 4.0–10.5)
nRBC: 0 % (ref 0.0–0.2)

## 2021-03-15 LAB — URINALYSIS, ROUTINE W REFLEX MICROSCOPIC
Bilirubin Urine: NEGATIVE
Glucose, UA: NEGATIVE mg/dL
Ketones, ur: NEGATIVE mg/dL
Nitrite: NEGATIVE
Protein, ur: 100 mg/dL — AB
Specific Gravity, Urine: 1.009 (ref 1.005–1.030)
pH: 7 (ref 5.0–8.0)

## 2021-03-15 LAB — COMPREHENSIVE METABOLIC PANEL
ALT: 16 U/L (ref 0–44)
AST: 18 U/L (ref 15–41)
Albumin: 3.8 g/dL (ref 3.5–5.0)
Alkaline Phosphatase: 37 U/L — ABNORMAL LOW (ref 38–126)
Anion gap: 6 (ref 5–15)
BUN: 9 mg/dL (ref 6–20)
CO2: 23 mmol/L (ref 22–32)
Calcium: 9.2 mg/dL (ref 8.9–10.3)
Chloride: 106 mmol/L (ref 98–111)
Creatinine, Ser: 0.7 mg/dL (ref 0.44–1.00)
GFR, Estimated: >60 mL/min (ref >60)
Glucose, Bld: 88 mg/dL (ref 70–99)
Potassium: 3.7 mmol/L (ref 3.5–5.1)
Sodium: 135 mmol/L (ref 135–145)
Total Bilirubin: 0.4 mg/dL (ref 0.3–1.2)
Total Protein: 6.6 g/dL (ref 6.5–8.1)

## 2021-03-15 LAB — HCG, QUANTITATIVE, PREGNANCY: hCG, Beta Chain, Quant, S: 2571 m[IU]/mL — ABNORMAL HIGH (ref <5)

## 2021-03-15 LAB — WET PREP, GENITAL
Sperm: NONE SEEN
Trich, Wet Prep: NONE SEEN
Yeast Wet Prep HPF POC: NONE SEEN

## 2021-03-15 LAB — HIV ANTIBODY (ROUTINE TESTING W REFLEX): HIV Screen 4th Generation wRfx: NONREACTIVE

## 2021-03-15 NOTE — Telephone Encounter (Signed)
Joan Knight called into the office wanting to come in to be seen for persistent spotting. Spoke with Dr. Ilda Basset and he advised patient to go to MAU for evaluation. [name] communicated to patient.   Rennis Golden, RN in MAU and reported patient is coming in for evaluation.

## 2021-03-15 NOTE — MAU Note (Signed)
Is 10 wks preg today.  Started spotting on Wed, had sex on Tue.  Was light pink, now is bright red and there is a lot more, but not filling a pad.  No cramping.

## 2021-03-15 NOTE — Telephone Encounter (Signed)
Patient called in stating she has been spotting and wanted to come into the office to get her HCG levels checked. I spoke with nurse Ivin Booty and Dr.Pickens and they advised patient to go to MAU due to this could be a medical emergency. Patient stated she didn't want to go there and wanted to come here. I advised patient I think she should take the doctors consideration into thought and go. Patiwent voiced understanding

## 2021-03-15 NOTE — MAU Provider Note (Signed)
History     CSN: [id number]  Arrival date and time: 03/15/21 1025   Event Date/Time   First Provider Initiated Contact with Patient 03/15/21 1133      Chief Complaint  Patient presents with   Vaginal Bleeding   HPI  Joan Knight is a 35 y.o.female [id number] @ [redacted]w[redacted]d (certain LMP) here in MAU with complaints of spotting that started on Wed/Thursday. She had sex on Tuesday night. She reports the spotting is right red today. She has no pain at all. No discharge, no odor.   Hx of DVT; not on anticoagulation now.   OB History     Gravida  1   Para  0   Term  0   Preterm  0   AB  0   Living  0      SAB  0   IAB  0   Ectopic  0   Multiple  0   Live Births  0           Past Medical History:  Diagnosis Date   Anemia    Anxiety    Depression    DVT (deep venous thrombosis) (HCC)    Had history of DVT in the right lower extremity about 5 years ago.  She tells me she was on blood thinner for about a year and then she stopped taking it.     Eczema    GERD (gastroesophageal reflux disease)    Nexplanon in place 02/24/2017   Sickle cell anemia (HCC)    Sickle cell trait (HCC)    Vaginal Pap smear, abnormal    "scraped or cut off" (?LEEP) no problems since    Past Surgical History:  Procedure Laterality Date   LEEP     REMOVAL OF IMPLANON ROD  09/26/2019    Family History  Problem Relation Age of Onset   Asthma Mother    Anxiety disorder Mother    Depression Mother    Bronchitis Mother    Emphysema Mother    COPD Mother    Heart disease Father        heart murmur   Hypertension Father    Deep vein thrombosis Father    Depression Father    Heart Problems Father    Diabetes Father    Breast cancer Maternal Aunt    Congenital heart disease Maternal Grandmother    Bone cancer Maternal Grandfather    Pancreatic cancer Neg Hx    Stomach cancer Neg Hx    Esophageal cancer Neg Hx    Colon cancer Neg Hx     Social History   Tobacco Use    Smoking status: Every Day    Packs/day: 0.25    Years: 16.00    Pack years: 4.00    Types: Cigarettes   Smokeless tobacco: Never   Tobacco comments:    Cutting back, plans to quit   Vaping Use   Vaping Use: Some days   Substances: Nicotine  Substance Use Topics   Alcohol use: Not Currently    Comment: occ/social   Drug use: No    Allergies: Not on File  Medications Prior to Admission  Medication Sig Dispense Refill Last Dose   acetaminophen (TYLENOL) 500 MG tablet Take 1,000 mg by mouth every 6 (six) hours as needed for mild pain.   03/14/2021   omeprazole (PRILOSEC) 40 MG capsule Take 1 capsule (40 mg total) by mouth in the morning and at bedtime. 60 capsule 5  03/14/2021   AMBULATORY NON FORMULARY MEDICATION Medication Name: Fleet suppository: uses daily for constipation. (Patient not taking: Reported on 02/04/2021)      dicyclomine (BENTYL) 20 MG tablet Take 1 tablet (20 mg total) by mouth every 12 (twelve) hours as needed (for abdominal pain/cramping). (Patient not taking: No sig reported) 20 tablet 0    dicyclomine (BENTYL) 20 MG tablet TAKE 1 TABLET (20 MG TOTAL) BY MOUTH EVERY 12 (TWELVE) HOURS AS NEEDED (FOR ABDOMINAL PAIN/CRAMPING). (Patient not taking: No sig reported) 20 tablet 0    ibuprofen (ADVIL,MOTRIN) 600 MG tablet Take 1 tablet (600 mg total) by mouth every 6 (six) hours as needed. 30 tablet 0    linaclotide (LINZESS) 72 MCG capsule Take 1 capsule (72 mcg total) by mouth daily before breakfast. (Patient not taking: No sig reported) 30 capsule 5    sertraline (ZOLOFT) 25 MG tablet Take 25 mg by mouth daily as needed. (Patient not taking: No sig reported)      Results for orders placed or performed during the hospital encounter of 03/15/21 (from the past 72 hour(s))  Urinalysis, Routine w reflex microscopic Urine, Clean Catch     Status: Abnormal   Collection Time: 03/15/21 10:55 AM  Result Value Ref Range   Color, Urine YELLOW (A) YELLOW   APPearance CLOUDY (A) CLEAR    Specific Gravity, Urine 1.009 1.005 - 1.030   pH 7.0 5.0 - 8.0   Glucose, UA NEGATIVE NEGATIVE mg/dL   Hgb urine dipstick LARGE (A) NEGATIVE   Bilirubin Urine NEGATIVE NEGATIVE   Ketones, ur NEGATIVE NEGATIVE mg/dL   Protein, ur 100 (A) NEGATIVE mg/dL   Nitrite NEGATIVE NEGATIVE   Leukocytes,Ua SMALL (A) NEGATIVE   RBC / HPF 21-50 0 - 5 RBC/hpf   WBC, UA 21-50 0 - 5 WBC/hpf   Bacteria, UA FEW (A) NONE SEEN   Squamous Epithelial / LPF 11-20 0 - 5   Mucus PRESENT     Comment: Performed at [facility], [address], [place], [place] [address]  CBC with Differential/Platelet     Status: Abnormal   Collection Time: 03/15/21 12:00 PM  Result Value Ref Range   WBC 7.3 4.0 - 10.5 K/uL   RBC 4.15 3.87 - 5.11 MIL/uL   Hemoglobin 12.5 12.0 - 15.0 g/dL   HCT 34.5 (L) 36.0 - 46.0 %   MCV 83.1 80.0 - 100.0 fL   MCH 30.1 26.0 - 34.0 pg   MCHC 36.2 (H) 30.0 - 36.0 g/dL   RDW 13.4 11.5 - 15.5 %   Platelets 331 150 - 400 K/uL   nRBC 0.0 0.0 - 0.2 %   Neutrophils Relative % 45 %   Neutro Abs 3.3 1.7 - 7.7 K/uL   Lymphocytes Relative 43 %   Lymphs Abs 3.1 0.7 - 4.0 K/uL   Monocytes Relative 9 %   Monocytes Absolute 0.7 0.1 - 1.0 K/uL   Eosinophils Relative 2 %   Eosinophils Absolute 0.2 0.0 - 0.5 K/uL   Basophils Relative 1 %   Basophils Absolute 0.1 0.0 - 0.1 K/uL   Immature Granulocytes 0 %   Abs Immature Granulocytes 0.02 0.00 - 0.07 K/uL    Comment: Performed at [facility], [address], [place], [place] [address]  Comprehensive metabolic panel     Status: Abnormal   Collection Time: 03/15/21 12:00 PM  Result Value Ref Range   Sodium 135 135 - 145 mmol/L   Potassium 3.7 3.5 -  5.1 mmol/L   Chloride 106 98 - 111 mmol/L   CO2 23 22 - 32 mmol/L   Glucose, Bld 88 70 - 99 mg/dL    Comment: Glucose reference range applies only to samples taken after fasting for at least 8 hours.   BUN 9 6 - 20 mg/dL   Creatinine, Ser 0.70 0.44 - 1.00 mg/dL   Calcium 9.2 8.9 - 10.3  mg/dL   Total Protein 6.6 6.5 - 8.1 g/dL   Albumin 3.8 3.5 - 5.0 g/dL   AST 18 15 - 41 U/L   ALT 16 0 - 44 U/L   Alkaline Phosphatase 37 (L) 38 - 126 U/L   Total Bilirubin 0.4 0.3 - 1.2 mg/dL   GFR, Estimated >60 >60 mL/min    Comment: (NOTE) Calculated using the CKD-EPI Creatinine Equation (2021)    Anion gap 6 5 - 15    Comment: Performed at [facility] [address], [place], [place] [address]  hCG, quantitative, pregnancy     Status: Abnormal   Collection Time: 03/15/21 12:00 PM  Result Value Ref Range   hCG, Beta Chain, Quant, S 2,571 (H) <5 mIU/mL    Comment:          GEST. AGE      CONC.  (mIU/mL)   <=1 WEEK        5 - 50     2 WEEKS       50 - 500     3 WEEKS       100 - 10,000     4 WEEKS     1,000 - 30,000     5 WEEKS     3,500 - 115,000   6-8 WEEKS     12,000 - 270,000    12 WEEKS     15,000 - 220,000        FEMALE AND NON-PREGNANT FEMALE:     LESS THAN 5 mIU/mL Performed at [facility], [place] [address], [place], [place] [address]   Wet prep, genital     Status: Abnormal   Collection Time: 03/15/21 12:45 PM  Result Value Ref Range   Yeast Wet Prep HPF POC NONE SEEN NONE SEEN   Trich, Wet Prep NONE SEEN NONE SEEN   Clue Cells Wet Prep HPF POC PRESENT (A) NONE SEEN   WBC, Wet Prep HPF POC RARE (A) NONE SEEN   Sperm NONE SEEN     Comment: Performed at [facility], [place] [address] [place], [place] [address]   US OB LESS THAN 14 WEEKS WITH OB TRANSVAGINAL  Result Date: 03/15/2021 CLINICAL DATA:  Vaginal bleeding in first trimester of pregnancy, spotting increased since Wednesday, LMP 01/04/2021, no quantitative beta HCG for correlation EXAM: OBSTETRIC <14 WK [place] AND TRANSVAGINAL OB US TECHNIQUE: Both transabdominal and transvaginal ultrasound examinations were performed for complete evaluation of the gestation as well as the maternal uterus, adnexal regions, and pelvic cul-de-sac. Transvaginal technique was performed to assess early pregnancy.  COMPARISON:  None FINDINGS: Intrauterine gestational sac: Present, single, irregular Yolk sac:  Not identified Embryo:  Not identified Cardiac Activity: N/A Heart Rate: N/A  bpm MSD: 20.3 mm   6 w   6 d Subchorionic hemorrhage:  None visualized. Maternal uterus/adnexae: Small posterior intramural uterine leiomyoma 17 x 12 x 12 mm. Ovaries unremarkable. No free pelvic fluid or adnexal masses. IMPRESSION: Gestational sac seen within the uterus with MSD corresponding to 6 weeks 6 days  EGA. Gestational sac is irregular and no yolk sac or fetal pole is identified. Findings are suspicious but not yet definitive for failed pregnancy. Recommend follow-up US in 10-14 days for definitive diagnosis. This recommendation follows SRU consensus guidelines: Diagnostic Criteria for Nonviable Pregnancy Early in the First Trimester. [place] Med 2013; 948:0165-53. Electronically Signed   By: Lavonia Dana M.D.   On: 03/15/2021 12:58      Review of Systems  Gastrointestinal:  Negative for abdominal pain, nausea and vomiting.  Genitourinary:  Positive for vaginal bleeding. Negative for vaginal discharge.  Physical Exam   Blood pressure 116/68, pulse 77, temperature 98.3 F (36.8 C), temperature source Oral, resp. rate 17, height 5\' 1"  (1.549 m), weight 80.4 kg, last menstrual period 01/04/2021, SpO2 100 %.  Physical Exam Vitals and nursing note reviewed.  Constitutional:      General: She is not in acute distress.    Appearance: Normal appearance. She is not ill-appearing, toxic-appearing or diaphoretic.  HENT:     Head: Normocephalic.  Abdominal:     Palpations: Abdomen is soft.     Tenderness: There is no abdominal tenderness.  Genitourinary:    Comments: Wet prep and GC collected without speculum. Small amount of dark red blood noted on swab  Neurological:     Mental Status: She is alert and oriented to person, place, and time.  Psychiatric:        Behavior: Behavior normal.   MAU Course   Procedures None  MDM  Wet prep & GC HIV, CBC, Hcg, ABO US OB transvaginal   Reviewed [place] results with patient.  A positive blood type  Hx of DVT, not on coagulation, this is likely a failed pregnancy. If viable pregnancy we discussed starting her on Lovenox. Discussed with Dr. Elgie Congo who is agreeable.   Assessment and Plan   A:  1. Pregnancy of unknown anatomic location   2. Vaginal bleeding in pregnancy, first trimester   3. Threatened miscarriage      P:  Discharge home in stable condition Return to MAU if symptoms worsen Return to MAU on Sunday 6/12 for stat Hcg level Bleeding precautions Ectopic precautions Pelvic rest Support given  Noni Saupe I, NP 03/15/2021 1:49 PM

## 2021-03-17 ENCOUNTER — Inpatient Hospital Stay (HOSPITAL_COMMUNITY)
Admission: AD | Admit: 2021-03-17 | Discharge: 2021-03-17 | Disposition: A | Discharge status: Home / Self Care | Payer: 59 | Attending: Obstetrics and Gynecology | Admitting: Obstetrics and Gynecology

## 2021-03-17 ENCOUNTER — Other Ambulatory Visit: Payer: Self-pay

## 2021-03-17 DIAGNOSIS — O039 Complete or unspecified spontaneous abortion without complication: Secondary | ICD-10-CM | POA: Diagnosis present

## 2021-03-17 DIAGNOSIS — Z3A10 10 weeks gestation of pregnancy: Secondary | ICD-10-CM | POA: Insufficient documentation

## 2021-03-17 LAB — HCG, QUANTITATIVE, PREGNANCY: hCG, Beta Chain, Quant, S: 435 m[IU]/mL — ABNORMAL HIGH (ref <5)

## 2021-03-17 LAB — GC/CHLAMYDIA PROBE AMP (~~LOC~~) NOT AT ARMC
Chlamydia: NEGATIVE
Comment: NEGATIVE
Comment: NORMAL
Neisseria Gonorrhea: NEGATIVE

## 2021-03-17 NOTE — MAU Note (Signed)
Pt reports to mau for repeat labs.  Pt reports her bleeding increased yesterday but has slowed down today.  Reports mild cramping.

## 2021-03-17 NOTE — MAU Provider Note (Signed)
History   Chief Complaint:  Follow-up   Joan Knight is  35 y.o. G1P0000 Patient's last menstrual period was 01/04/2021 (exact date).. Patient is here for follow up of quantitative HCG and ongoing surveillance of pregnancy status. She is [redacted]w[redacted]d weeks gestation  by LMP.    Since her last visit, the patient is without new complaint. The patient reports bleeding as  none now.  She denies any pain.  General ROS:  negative  Her previous Quantitative HCG values are: Results for SAMARRAH, TRANCHINA (MRN [id number]) as of 03/17/2021 13:46  Ref. Range 03/15/2021 12:00  HCG, Beta Chain, Quant, S Latest Ref Range: <5 mIU/mL 2,571 (H)   Physical Exam   Blood pressure 116/67, pulse 75, temperature 98.1 F (36.7 C), temperature source Oral, resp. rate 17, last menstrual period 01/04/2021, SpO2 99 %.  Physical Exam Vitals and nursing note reviewed.  Constitutional:      General: She is not in acute distress.    Appearance: She is well-developed.  HENT:     Head: Normocephalic.  Eyes:     Pupils: Pupils are equal, round, and reactive to light.  Cardiovascular:     Rate and Rhythm: Normal rate and regular rhythm.  Pulmonary:     Effort: Pulmonary effort is normal. No respiratory distress.     Breath sounds: Normal breath sounds.  Abdominal:     Palpations: Abdomen is soft.     Tenderness: There is no abdominal tenderness.  Musculoskeletal:        General: Normal range of motion.     Cervical back: Normal range of motion.  Skin:    General: Skin is warm and dry.  Neurological:     Mental Status: She is alert and oriented to person, place, and time.  Psychiatric:        Behavior: Behavior normal.        Thought Content: Thought content normal.        Judgment: Judgment normal.     Labs: Results for orders placed or performed during the hospital encounter of 03/17/21 (from the past 24 hour(s))  hCG, quantitative, pregnancy   Collection Time: 03/17/21 12:25 PM  Result Value Ref Range    hCG, Beta Chain, Quant, S 435 (H) <5 mIU/mL    Assessment:   1. Miscarriage   2. [redacted] weeks gestation of pregnancy     Results reviewed at length with patient. Patient verbalized understanding and follow up plan.  Plan: -Discharge home in stable condition -Vaginal bleeding and pain precautions discussed -Patient advised to follow-up with OB in 1 week for repeat blood work and 2 weeks with a provider, message sent -Patient may return to MAU as needed or if her condition were to change or worsen  Wende Mott, CNM 03/17/2021, 1:45 PM

## 2021-03-18 ENCOUNTER — Encounter: Payer: Self-pay | Admitting: *Deleted

## 2021-03-21 ENCOUNTER — Telehealth (INDEPENDENT_AMBULATORY_CARE_PROVIDER_SITE_OTHER): Payer: 59

## 2021-03-21 DIAGNOSIS — O039 Complete or unspecified spontaneous abortion without complication: Secondary | ICD-10-CM

## 2021-03-21 NOTE — Progress Notes (Signed)
Called pt for virtual intake appt.  Pt informed me that she had a miscarriage on 03/15/21 (reviewed notes in chart) and was advised by the provider in MAU to keep this appt so that she can request an appt for f/u of SAB.  Pt advised that we have scheduled her an appt for 04/09/21 @ 0835.  I encouraged pt that at the appt she will be able to discuss if she wants to get on [facility] or discuss any concerns about her miscarriage.  Pt verbalized understanding with no further questions.   [name]  03/21/21

## 2021-03-22 NOTE — Progress Notes (Signed)
Patient was assessed and managed by nursing staff during this encounter. I have reviewed the chart and agree with the documentation and plan.   Gaylan Gerold, MSN, CNM, IBCLC 03/22/21 8:21 AM

## 2021-03-27 ENCOUNTER — Encounter: Payer: 59 | Admitting: Certified Nurse Midwife

## 2021-04-01 NOTE — BH Specialist Note (Signed)
Integrated Behavioral Health via Telemedicine Visit  04/01/2021 Joan Knight [id number]  Number of [place] visits: 3 Session Start time: 9:15  Session End time: 9:37 Total time:  22  Referring Provider: Gaylan Gerold, CNM Patient/Family location: Home [facility] Provider location: [facility] [place] [facility]  All persons participating in visit: [name] and [place]   Types of Service: Individual psychotherapy and Video visit  I connected with Joan Knight and/or Joan Knight  via  Telephone or Geologist, engineering  (Video is Tree surgeon) and verified that I am speaking with the correct person using two identifiers. Discussed confidentiality: Yes   I discussed the limitations of telemedicine and the availability of in person appointments.  Discussed there is a possibility of technology failure and discussed alternative modes of communication if that failure occurs.  I discussed that engaging in this telemedicine visit, they consent to the provision of behavioral healthcare and the services will be billed under their insurance.  Patient and/or legal guardian expressed understanding and consented to Telemedicine visit: Yes   Presenting Concerns: Patient and/or family reports the following symptoms/concerns: Pt's primary concern today is grieving loss of pregnancy at about 10 weeks, along with a dull, continuous pain in lower left stomach and back since 03/28/21 Duration of problem: over 2 weeks; Severity of problem:  moderately severe  Patient and/or Family's Strengths/Protective Factors: Social connections, Concrete supports in place (healthy food, safe environments, etc.), and Sense of purpose  Goals Addressed: Patient will:  Reduce symptoms of:   Demonstrate ability to:  Continue healthy greiving over loss  Progress towards  Goals: Ongoing  Interventions: Interventions utilized:  Supportive Counseling Standardized Assessments completed: Not Needed  Patient and/or Family Response: Pt agrees with treatment plan  Assessment: Patient currently experiencing Grief  Patient may benefit from supportive counseling today.  Plan: Follow up with behavioral health clinician on : Call Joan Knight as needed at [phone] Behavioral recommendations:  -[facility] to address medical concerns -Consider registering for online support group for perinatal loss at [email or web address].net for additional support at this time -Continue taking prenatal vitamin, as recommended by medical provider, for overall wellness Referral(s): [place] (In Clinic)  I discussed the assessment and treatment plan with the patient and/or parent/guardian. They were provided an opportunity to ask questions and all were answered. They agreed with the plan and demonstrated an understanding of the instructions.   They were advised to call back or seek an in-person evaluation if the symptoms worsen or if the condition fails to improve as anticipated.  Joan Knight Joan Lamson, LCSW

## 2021-04-02 ENCOUNTER — Ambulatory Visit (INDEPENDENT_AMBULATORY_CARE_PROVIDER_SITE_OTHER): Payer: 59 | Admitting: Clinical

## 2021-04-02 ENCOUNTER — Ambulatory Visit (INDEPENDENT_AMBULATORY_CARE_PROVIDER_SITE_OTHER): Payer: 59

## 2021-04-02 ENCOUNTER — Telehealth: Payer: Self-pay

## 2021-04-02 ENCOUNTER — Other Ambulatory Visit: Payer: Self-pay

## 2021-04-02 VITALS — BP 116/70 | HR 79 | Ht 61.0 in | Wt 177.9 lb

## 2021-04-02 DIAGNOSIS — O039 Complete or unspecified spontaneous abortion without complication: Secondary | ICD-10-CM | POA: Diagnosis not present

## 2021-04-02 DIAGNOSIS — F4321 Adjustment disorder with depressed mood: Secondary | ICD-10-CM | POA: Diagnosis not present

## 2021-04-02 LAB — POCT URINALYSIS DIP (DEVICE)
Bilirubin Urine: NEGATIVE
Glucose, UA: NEGATIVE mg/dL
Ketones, ur: NEGATIVE mg/dL
Leukocytes,Ua: NEGATIVE
Nitrite: NEGATIVE
Protein, ur: NEGATIVE mg/dL
Specific Gravity, Urine: 1.025 (ref 1.005–1.030)
Urobilinogen, UA: 0.2 mg/dL (ref 0.0–1.0)
pH: 5.5 (ref 5.0–8.0)

## 2021-04-02 LAB — BETA HCG QUANT (REF LAB): hCG Quant: 2 m[IU]/mL

## 2021-04-02 NOTE — Progress Notes (Signed)
Pt states having left lower abd dull, consistent pain and back pain x 1 week. Denies any severe pain, nausea, vomiting. Pt had recent SAB on 03/17/21 and had vaginal bleeding for 5-6 days, then had no bleeding since then until 1 week ago. Pt denies any hx of kidney stones. UA today in office was negative. Denies all urinary symptoms.  Pt will have Stat Beta Hcg today and will be called with results.   Colletta Maryland, RN

## 2021-04-02 NOTE — Telephone Encounter (Signed)
Pt was seen by Roselyn Reef [facility] this AM for appt. Following message received from [place]:   Pt had miscarriage on 03/17/21; began having dull, continuous stomach and back pain (left lower stomach and back) since 03/28/21 (bad enough to keep her from sleeping at night). Pt is not scheduled to be seen until 04/09/21. Should she be seen sooner?   -----  I called pt to follow up. Pt reports dull pain in back and abdomen. Describes this as different pain that earlier in the month. Reports stomach hurts with urination. Reviewed recent pt history and complaints today with [place], MD who recommends pt come into office for stat beta HCG and UA. Pt agreeable to plan. Colletta Maryland, RN given report for nurse visit.

## 2021-04-02 NOTE — Progress Notes (Signed)
Call placed to pt for results. Left VM with detailed message. Pt given results and recommendations per Dr Berniece Andreas. Pt advised to call back to office or send mychart message with any questions or concerns.   Colletta Maryland, RN

## 2021-04-03 NOTE — Progress Notes (Signed)
I agree with findings and care plan as documented in the RN note.    Sharene Skeans, MD [facility] Family Medicine Fellow, [facility] for [facility], [place]

## 2021-04-09 ENCOUNTER — Ambulatory Visit: Payer: 59 | Admitting: Certified Nurse Midwife

## 2021-05-16 ENCOUNTER — Other Ambulatory Visit: Payer: Self-pay

## 2021-05-16 ENCOUNTER — Telehealth: Payer: Self-pay | Admitting: Clinical

## 2021-05-16 ENCOUNTER — Ambulatory Visit (HOSPITAL_COMMUNITY)
Admission: EM | Admit: 2021-05-16 | Discharge: 2021-05-16 | Disposition: A | Discharge status: Home / Self Care | Payer: 59 | Attending: Student | Admitting: Student

## 2021-05-16 ENCOUNTER — Telehealth (HOSPITAL_COMMUNITY): Payer: Self-pay | Admitting: Psychiatry

## 2021-05-16 DIAGNOSIS — F53 Postpartum depression: Secondary | ICD-10-CM | POA: Diagnosis not present

## 2021-05-16 DIAGNOSIS — O99345 Other mental disorders complicating the puerperium: Secondary | ICD-10-CM | POA: Diagnosis not present

## 2021-05-16 MED ORDER — HYDROXYZINE PAMOATE 25 MG PO CAPS: 25.0000 mg | ORAL_CAPSULE | Freq: Three times a day (TID) | ORAL | 0 refills | Status: AC

## 2021-05-16 MED ORDER — HYDROXYZINE PAMOATE 25 MG PO CAPS: 25.0000 mg | ORAL_CAPSULE | Freq: Three times a day (TID) | ORAL | 0 refills | Status: DC | PRN

## 2021-05-16 MED ORDER — HYDROXYZINE PAMOATE 25 MG PO CAPS
25.0000 mg | ORAL_CAPSULE | Freq: Three times a day (TID) | ORAL | 0 refills | Status: DC
  Filled 2021-05-16: qty 90, 30d supply, fill #0

## 2021-05-16 NOTE — ED Provider Notes (Addendum)
Behavioral Health Urgent Care Medical Screening Exam  Patient Name: Joan Knight MRN: [id number] Date of Evaluation: 05/16/21 Chief Complaint:  Worsening Depression Diagnosis:  Final diagnoses:  Postpartum depression    History of Present illness: Joan Knight is a 35 y.o. female w/ hx of recent miscarriage and MDD presents to [facility] w/ father for worsening depression. Pt states she has been having depressive symptoms since miscarriage but has drastically worsened over past few weeks. Pt states she feels she is alone, sleeping 4-5 hours, waking up crying, low energy, poor appetite, and "not feeling like herself".  Patient states that boyfriend is "blaming her for the miscarriage".  Patient feels that she is "lifeless".  Patient is also dealing with stressors at home with separated mom and dad.  Patient has also referenced that she is still dealing with stepmom passing 3 years ago.  Patient is "self-medicating" by smoking weed every day.  Patient does not regularly marijuana user but due to present depression and feels it was necessary.  Patient states she has no friends that she can rely on, only mom and dad.  Patient denies present SI HI VH.  Patient states that when she was sleeping last night she woke up and heard a female voice stating "just do it already.  No need for you to be here.".  Patient has not had any prior SI HI AVH or para suicidal behaviors.  Patient has been on Zoloft 25 mg prior to her pregnancy but felt that it made her dizzy and "like herself".  Patient states she is able to contract for safety and will tell that if she has any thoughts of harming herself.  Father assured that he would put sharp objects in lock box as well as firearms.  Patient presently does not have any interest in inpatient psychiatric hospitalization but is agreeable to PHP/IOP program.  Psychiatric Specialty Exam  Presentation  General Appearance:Appropriate for Environment  Eye  Contact:Minimal  Speech:Slow  Speech Volume:Normal  Handedness:No data recorded  Mood and Affect  Mood:Depressed  Affect:Depressed   Thought Process  Thought Processes:Coherent  Descriptions of Associations:Intact  Orientation:Full (Time, Place and Person)  Thought Content:Logical  Diagnosis of Schizophrenia or Schizoaffective disorder in past: No   Hallucinations:Auditory Stated she was hearing voices when she was sleeping once telling her to "just do it already. No need for you to be here"  Ideas of Reference:No data recorded Suicidal Thoughts:No  Homicidal Thoughts:No   Sensorium  Memory:Immediate Good; Recent Good; Remote Good  Judgment:Fair  Insight:Fair   Executive Functions  Concentration:Good  Attention Span:Fair  [place]; Housing; Social Support   Sleep  Sleep:Poor  Number of hours: 5   No data recorded  Physical Exam: Physical Exam Vitals and nursing note reviewed.  Constitutional:      General: She is not in acute distress.    Appearance: She is well-developed.  HENT:     Head: Normocephalic and atraumatic.  Eyes:     Conjunctiva/sclera: Conjunctivae normal.  Cardiovascular:     Rate and Rhythm: Normal rate and regular rhythm.     Heart sounds: No murmur heard. Pulmonary:     Effort: Pulmonary effort is normal. No respiratory distress.     Breath sounds: Normal breath sounds.  Abdominal:     Palpations: Abdomen is soft.     Tenderness: no abdominal tenderness  Musculoskeletal:  Cervical back: Neck supple.  Skin:    General: Skin is warm and dry.  Neurological:     Mental Status: She is alert.   Review of Systems  Constitutional:  Negative for chills and fever.  Respiratory:  Negative for cough, shortness of breath and wheezing.   Cardiovascular:  Negative for chest pain and  palpitations.  Gastrointestinal:  Negative for abdominal pain, nausea and vomiting.  Skin:  Negative for itching and rash.  Neurological:  Negative for dizziness and headaches.  Blood pressure 120/66, pulse 84, temperature 98.7 F (37.1 C), temperature source Oral, resp. rate 18, last menstrual period 01/04/2021, SpO2 100 %. There is no height or weight on file to calculate BMI.  Musculoskeletal: Strength & Muscle Tone: within normal limits Gait & Station: normal Patient leans: [place] MSE Discharge Disposition for Follow up and Recommendations: Based on my evaluation the patient does not appear to have an emergency medical condition and can be discharged with resources and follow up care in outpatient services for Partial Hospitalization Program  Pt is scheduled to started at [facility] on 05-22-21 @ 9 a.m. Pt has also been given prescription of Vistaril 25 mg po tid prn for anxiety.   France Ravens, MD 05/16/2021, 4:06 PM

## 2021-05-16 NOTE — BH Assessment (Addendum)
Comprehensive Clinical Assessment (CCA) Note  05/16/2021 Joan Knight [id number]  Patient is a 35 year old female presenting voluntarily to [facility] reporting increased depression, anxiety, and suicidal ideation.She is accompanied by her father, Joan Knight, who is present for evaluation at request of patient and provides collateral information. She reports a prior diagnosis of MDD and GAD but her symptoms have become more severe since she suffered a miscarriage 4 weeks ago. She states her boyfriend blames her for the miscarriage and she feels guilty, despite the fact there was nothing she could have done to prevent it.  Patient endorses passive suicidal ideation with thoughts of,"life isn't worth living" and "everyone would be better off if I wasn't here." She denies any active plan, prior attempts, or history of self harming behavior. She denies HI/AVH. She states last night when she was sleeping she could hear her own voice stating, "Just go ahead and do it. You don't need to be here anymore." Patient additionally states she has been self-medicating with THC since the miscarriage, after not having used in over 1 year. She is not currently followed by an outpatient provider or prescribed psychiatric medications.  Per Dr. Lurline Hare patient does not meet in patient care criteria and is psych cleared for discharged. Patient has been referred to [facility]  Chief Complaint:  Chief Complaint  Patient presents with   Urgent Emergent Eval   Visit Diagnosis: F33.2 MDD, recurrent, severe F41.1 GAD F12.10 Cannabis use disorder, mild    CCA Screening, Triage and Referral (STR)  Patient Reported Information How did you hear about [place]? Family/Friend  What Is the Reason for Your Visit/Call Today? SI, Pt reports that she had a miscarriage on March 21, 2021, "I am not feeling like myself".  How Long Has This Been Causing You Problems? 1 wk - 1 month  What Do You Feel Would Help You the Most Today? Treatment for Depression  or other mood problem   Have You Recently Had Any Thoughts About Hurting Yourself? Yes  Are You Planning to Commit Suicide/Harm Yourself At This time? No   Have you Recently Had Thoughts About [place]? No  Are You Planning to Harm Someone at This Time? No  Explanation: No data recorded  Have You Used Any Alcohol or Drugs in the Past 24 Hours? Yes  How Long Ago Did You Use Drugs or Alcohol? No data recorded What Did You Use and How Much? Marijuana   Do You Currently Have a Therapist/Psychiatrist? No  Name of Therapist/Psychiatrist: No data recorded  Have You Been Recently Discharged From Any Office Practice or Programs? No  Explanation of Discharge From Practice/Program: No data recorded    CCA Screening Triage Referral Assessment Type of Contact: Face-to-Face  Telemedicine Service Delivery:   Is this Initial or Reassessment? No data recorded Date Telepsych consult ordered in CHL:  No data recorded Time Telepsych consult ordered in CHL:  No data recorded Location of Assessment: [facility] Assessment Services  Provider Location: [facility] Assessment Services   Collateral Involvement: father, Joan Knight   Does Patient Have a [place]? No data recorded Name and Contact of Legal Guardian: No data recorded If Minor and Not Living with Parent(s), Who has Custody? No data recorded Is CPS involved or ever been involved? Never  Is APS involved or ever been involved? Never   Patient Determined To Be At Risk for Harm To Self or Others Based on Review of Patient Reported Information or Presenting Complaint? No  Method: No data recorded Availability of Means: No data recorded Intent: No data recorded Notification Required: No data recorded Additional Information for Danger to Others Potential: No data recorded Additional Comments for Danger to Others Potential: No data recorded Are There Guns or Other Weapons in Your Home? No data recorded Types  of Guns/Weapons: No data recorded Are These Weapons Safely Secured?                            No data recorded Who Could Verify You Are Able To Have These Secured: No data recorded Do You Have any Outstanding Charges, Pending Court Dates, Parole/Probation? No data recorded Contacted To Inform of Risk of Harm To Self or Others: No data recorded   Does Patient Present under Involuntary Commitment? No  IVC Papers Initial File Date: No data recorded  [place] of Residence: [place]   Patient Currently Receiving the Following Services: Not Receiving Services   Determination of Need: Urgent (48 hours)   Options For Referral: [facility]gent [facility]     CCA Biopsychosocial Patient Reported Schizophrenia/Schizoaffective Diagnosis in Past: No   Strengths: good support system, employed, insightful   Mental Health Symptoms Depression:   Change in energy/activity; Difficulty Concentrating; Fatigue; Hopelessness; Increase/decrease in appetite; Irritability; Weight gain/loss; Tearfulness; Sleep (too much or little); Worthlessness   Duration of Depressive symptoms:  Duration of Depressive Symptoms: Greater than two weeks   Mania:   None   Anxiety:    Difficulty concentrating; Fatigue; Irritability; Sleep; Restlessness; Tension; Worrying   Psychosis:   None   Duration of Psychotic symptoms:    Trauma:   None   Obsessions:   None   Compulsions:   None   Inattention:   None   Hyperactivity/Impulsivity:   None   Oppositional/Defiant Behaviors:   None   Emotional Irregularity:   None   Other Mood/Personality Symptoms:  No data recorded   Mental Status Exam Appearance and self-care  Stature:   Average   Weight:   Overweight   Clothing:   Neat/clean   Grooming:   Normal   Cosmetic use:   None   Posture/gait:   Tense   Motor activity:   Not Remarkable   Sensorium  Attention:   Normal   Concentration:   Normal   Orientation:   X5   Recall/memory:    Normal   Affect and Mood  Affect:   Depressed; Tearful   Mood:   Depressed   Relating  Eye contact:   Normal   Facial expression:   Depressed   Attitude toward examiner:   Cooperative   Thought and Language  Speech flow:  Clear and Coherent   Thought content:   Appropriate to Mood and Circumstances   Preoccupation:   None   Hallucinations:   None   Organization:  No data recorded  [employer] of Knowledge:   Good   Intelligence:   Average   Abstraction:   Normal   Judgement:   Good   Reality Testing:   Realistic   Insight:   Fair   Decision Making:   Paralyzed   Social Functioning  Social Maturity:   Isolates   Social Judgement:   Normal   Stress  Stressors:   Grief/losses   Coping Ability:   Deficient supports   Skill Deficits:   Activities of daily living; Communication; Self-care; Responsibility   Supports:   Family     Religion:  Religion/Spirituality Are You A Religious Person?: No  Leisure/Recreation: Leisure / Recreation Do You Have Hobbies?: No  Exercise/Diet: Exercise/Diet Do You Exercise?: No Have You Gained or Lost A Significant Amount of Weight in the Past Six Months?: No Do You Follow a Special Diet?: No Do You Have Any Trouble Sleeping?: Yes Explanation of Sleeping Difficulties: states she sleeps 4-5 hours per night   CCA Employment/Education Employment/Work Situation: Employment / Work Situation Employment Situation: Employed Work Stressors: 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 Patient's Job has Been Impacted by Current Illness: No Has Patient ever Been in Passenger transport manager?: No  Education: Education Is Patient Currently Attending School?: No Last Grade Completed: 12 Did You Nutritional therapist?: No Did You Have An Individualized Education Program (IIEP): No Did You Have Any Difficulty At [employer]?: No Patient's Education Has Been Impacted by Current Illness: No   CCA  Family/Childhood History Family and Relationship History: Family history Marital status: Long term relationship Long term relationship, how long?: a couple years What types of issues is patient dealing with in the relationship?: be blames patient for her recent miscarriage Does patient have children?: No  Childhood History:  Childhood History By whom was/is the patient raised?: Both parents Did patient suffer any verbal/emotional/physical/sexual abuse as a child?: No Did patient suffer from severe childhood neglect?: No Has patient ever been sexually abused/assaulted/raped as an adolescent or adult?: No Was the patient ever a victim of a crime or a disaster?: No Witnessed domestic violence?: No Has patient been affected by domestic violence as an adult?: No  Child/Adolescent Assessment:     CCA Substance Use Alcohol/Drug Use: Alcohol / Drug Use Pain Medications: see MAR Prescriptions: see MAR Over the Counter: see MAR History of alcohol / drug use?: Yes Substance #1 Name of Substance 1: THC 1 - Age of First Use: UTA 1 - Amount (size/oz): varies 1 - Frequency: daily 1 - Duration: 1 month 1 - Last Use / Amount: 8/10 1 - Method of Aquiring: purchased 1- Route of Use: smoking                       ASAM's:  Six Dimensions of Multidimensional Assessment  Dimension 1:  Acute Intoxication and/or Withdrawal Potential:   Dimension 1:  Description of individual's past and current experiences of substance use and withdrawal: daily current use due to grief, no history of addiction or withdrawal  Dimension 2:  Biomedical Conditions and Complications:   Dimension 2:  Description of patient's biomedical conditions and  complications: none reported  Dimension 3:  Emotional, Behavioral, or Cognitive Conditions and Complications:  Dimension 3:  Description of emotional, behavioral, or cognitive conditions and complications: currently experiencing post partum depression   Dimension 4:  Readiness to Change:  Dimension 4:  Description of Readiness to Change criteria: contemplative  Dimension 5:  Relapse, Continued use, or Continued Problem Potential:  Dimension 5:  Relapse, continued use, or continued problem potential critiera description: patient seeking assistance  Dimension 6:  Recovery/Living Environment:  Dimension 6:  Recovery/Iiving environment criteria description: lives with father who is supportive  ASAM Severity Score: ASAM's Severity Rating Score: 6  ASAM Recommended Level of Treatment: ASAM Recommended Level of Treatment: Level I Outpatient Treatment   Substance use Disorder (SUD) Substance Use Disorder (SUD)  Checklist Symptoms of Substance Use: Evidence of tolerance, Presence of craving or strong urge to use, Social, occupational, recreational activities given up or reduced due to use, Substance(s) often taken in larger  amounts or over longer times than was intended  Recommendations for Services/Supports/Treatments: Recommendations for Services/Supports/Treatments Recommendations For Services/Supports/Treatments: Facility Based Crisis, Individual Therapy, CD-IOP Intensive Chemical Dependency Program, Medication Management, Partial Hospitalization  Discharge Disposition:    DSM5 Diagnoses: Patient Active Problem List   Diagnosis Date Noted   Tobacco abuse 09/26/2019   DVT (deep venous thrombosis) ([place])    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

## 2021-05-16 NOTE — Telephone Encounter (Signed)
D: Dr. Lurline Hare referred pt to [facility].  A:  Placed call to orient pt and provide a start date.  Encouraged pt to verify her benefits.  Pt will start MH-IOP on 05-22-21 at 9 a.m.  R: Pt receptive.

## 2021-05-16 NOTE — BH Assessment (Signed)
Joan Knight, urgent MR # [id number]; 35 year old presents this date with her father, Veva Malek, [phone].  Pt states that she had a miscarriage on March 21, 2021, "I am not feeling like myself".  Pt reports SI, with no plan.  Pt denies HI and AVH.  Pt admits to prior MH diagnose; also, taking prescribed medication for symptom management.   MSE signed by patient.

## 2021-05-16 NOTE — Telephone Encounter (Signed)
Pt called, requesting FMLA form filled out, due to mental health concern and complicated grief due to pregnancy loss and overwhelming life stress. Pt is made aware that generally ob/gyns will recommend 6 weeks out of work postpartum and with loss,  that [facility] Roselyn Reef is not a medical provider, and that she would benefit from seeing psychiatrist, as they are medical provider who may be able to complete FMLA for additional time off work due to [facility] issues. Pt agrees to walk in to [facility], since she endorses SI; her father will be able to take her today.   Pt agrees that Roselyn Reef will call her on Monday, 05/20/21, to check on her, as Roselyn Reef will not be in the office on Friday, 05/17/21. Pt may also call Roselyn Reef at [phone] as needed.

## 2021-05-16 NOTE — Discharge Instructions (Addendum)
Recommend Partial Hospitalization Program/Intensive Outpatient Program for ongoing symptoms.  Expect a call from Dellia Nims, the program coordinator. If you do not receive a call by 10 AM tomorrow, please call [phone]

## 2021-05-22 ENCOUNTER — Other Ambulatory Visit: Payer: Self-pay

## 2021-05-22 ENCOUNTER — Other Ambulatory Visit (HOSPITAL_COMMUNITY): Payer: 59 | Attending: Psychiatry | Admitting: Psychiatry

## 2021-05-22 ENCOUNTER — Encounter (HOSPITAL_COMMUNITY): Payer: Self-pay | Admitting: Psychiatry

## 2021-05-22 DIAGNOSIS — F332 Major depressive disorder, recurrent severe without psychotic features: Secondary | ICD-10-CM | POA: Insufficient documentation

## 2021-05-22 DIAGNOSIS — F17210 Nicotine dependence, cigarettes, uncomplicated: Secondary | ICD-10-CM | POA: Insufficient documentation

## 2021-05-22 MED ORDER — SERTRALINE HCL 50 MG PO TABS
50.0000 mg | ORAL_TABLET | Freq: Every day | ORAL | 2 refills | Status: DC
  Filled 2021-05-22 – 2021-05-29 (×2): qty 30, 30d supply, fill #0

## 2021-05-22 NOTE — Progress Notes (Signed)
Virtual Visit via Video Note  I connected with Joan Knight on 05/22/21 at  9:00 AM EDT by a video enabled telemedicine application and verified that I am speaking with the correct person using two identifiers.  Location: Patient: Home Provider:  Office   I discussed the limitations of evaluation and management by telemedicine and the availability of in person appointments. The patient expressed understanding and agreed to proceed.   I discussed the assessment and treatment plan with the patient. The patient was provided an opportunity to ask questions and all were answered. The patient agreed with the plan and demonstrated an understanding of the instructions.   The patient was advised to call back or seek an in-person evaluation if the symptoms worsen or if the condition fails to improve as anticipated.  I provided 15  minutes of non-face-to-face time during this encounter.   Derrill Center, NP    Psychiatric Initial Adult Assessment   Patient Identification: Joan Knight MRN:  [id number] Date of Evaluation:  05/22/2021 Referral Source:  Chief Complaint:   Chief Complaint   Depression; Anxiety; Trauma; Stress    Visit Diagnosis:    ICD-10-CM   1. MDD (major depressive disorder), recurrent severe, without psychosis ([place])  F33.2       History of Present Illness:  Joan Knight is a 35 year old African-American female that presents with worsening depression and anxiety.  She reports " going through a lot tragedy" reports the passing of her stepmother who was married to her father for the past 25 years.  States her father's home is in foreclosure.  States her mother has an eviction notice.  And reports a miscarriage of her first child about 4 weeks prior.    She denied previous inpatient admissions.  Does admit to marijuana use to help with her anxiety.  States she has been self-medicating for the past 5 to 6 years.  Reports multiple crying spells, anxiety, poor  concentration and mood irritability.  Reports she was followed by a therapist after her biological parents was divorced when she was 86 years old.  She was recently evaluated at the local urgent care where she was initiated on Zoloft and hydroxyzine.  She denied taking medications as indicated.  Patient to start intensive outpatient programming on 05/22/2021.  Per HPI" History of Present illness: Joan Knight is a 35 y.o. female w/ hx of recent miscarriage and MDD presents to [facility] w/ father for worsening depression. Pt states she has been having depressive symptoms since miscarriage but has drastically worsened over past few weeks. Pt states she feels she is alone, sleeping 4-5 hours, waking up crying, low energy, poor appetite, and "not feeling like herself".  Patient states that boyfriend is "blaming her for the miscarriage".  Patient feels that she is "lifeless".  Patient is also dealing with stressors at home with separated mom and dad.  Patient has also referenced that she is still dealing with stepmom passing 3 years ago.  Patient is "self-medicating" by smoking weed every day.  Patient does not regularly marijuana user but due to present depression and feels it was necessary.  Patient states she has no friends that she can rely on, only mom and dad."  Associated Signs/Symptoms: Depression Symptoms:  depressed mood, difficulty concentrating, hopelessness, anxiety, (Hypo) Manic Symptoms:  Distractibility, Anxiety Symptoms:  Excessive Worry, Psychotic Symptoms:     PTSD Symptoms: NA  Past Psychiatric History:   Previous Psychotropic Medications: No   Substance Abuse History in  the last 12 months:  Yes.    Consequences of Substance Abuse: NA  Past Medical History:  Past Medical History:  Diagnosis Date   Anemia    Anxiety    Depression    DVT (deep venous thrombosis) ([place])    Had history of DVT in the right lower extremity about 5 years ago.  She tells me she was on blood thinner  for about a year and then she stopped taking it.     Eczema    GERD (gastroesophageal reflux disease)    Nexplanon in place 02/24/2017   Sickle cell anemia (HCC)    Sickle cell trait (HCC)    Vaginal Pap smear, abnormal    "scraped or cut off" (?LEEP) no problems since    Past Surgical History:  Procedure Laterality Date   LEEP     REMOVAL OF IMPLANON ROD  09/26/2019    Family Psychiatric History:   Family History:  Family History  Problem Relation Age of Onset   Asthma Mother    Anxiety disorder Mother    Depression Mother    Bronchitis Mother    Emphysema Mother    COPD Mother    Heart disease Father        heart murmur   Hypertension Father    Deep vein thrombosis Father    Depression Father    Heart Problems Father    Diabetes Father    Breast cancer Maternal Aunt    Congenital heart disease Maternal Grandmother    Bone cancer Maternal Grandfather    Pancreatic cancer Neg Hx    Stomach cancer Neg Hx    Esophageal cancer Neg Hx    Colon cancer Neg Hx     Social History:   Social History   Socioeconomic History   Marital status: Single    Spouse name: Not on file   Number of children: Not on file   Years of education: Not on file   Highest education level: Not on file  Occupational History   Not on file  Tobacco Use   Smoking status: Every Day    Packs/day: 0.25    Years: 16.00    Pack years: 4.00    Types: Cigarettes   Smokeless tobacco: Never   Tobacco comments:    Cutting back, plans to quit   Vaping Use   Vaping Use: Some days   Substances: Nicotine  Substance and Sexual Activity   Alcohol use: Not Currently    Comment: occ/social   Drug use: No   Sexual activity: Yes    Birth control/protection: None  Other Topics Concern   Not on file  Social History Narrative   Not on file   Social Determinants of Health   Financial Resource Strain: Not on file  Food Insecurity: Not on file  Transportation Needs: Not on file  Physical Activity:  Not on file  Stress: Not on file  Social Connections: Not on file    Additional Social History:   Allergies:  No Known Allergies  Metabolic Disorder Labs: Lab Results  Component Value Date   HGBA1C 5.2 03/07/2014   MPG 103 03/07/2014   No results found for: PROLACTIN No results found for: CHOL, TRIG, HDL, CHOLHDL, VLDL, LDLCALC Lab Results  Component Value Date   TSH 0.267 (L) 08/05/2019    Therapeutic Level Labs: No results found for: LITHIUM No results found for: CBMZ No results found for: VALPROATE  Current Medications: Current Outpatient Medications  Medication Sig  Dispense Refill   acetaminophen (TYLENOL) 500 MG tablet Take 1,000 mg by mouth every 6 (six) hours as needed for mild pain, fever or headache.     hydrOXYzine (VISTARIL) 25 MG capsule Take 1 capsule (25 mg total) by mouth 3 (three) times daily. 90 capsule 0   hydrOXYzine (VISTARIL) 25 MG capsule Take 1 capsule (25 mg total) by mouth 3 (three) times daily. 90 capsule 0   omeprazole (PRILOSEC) 40 MG capsule Take 1 capsule (40 mg total) by mouth in the morning and at bedtime. (Patient taking differently: Take 40 mg by mouth daily.) 60 capsule 5   sertraline (ZOLOFT) 50 MG tablet Take 1 tablet (50 mg total) by mouth daily. 30 tablet 2   No current facility-administered medications for this visit.    Musculoskeletal: Strength & Muscle Tone: within normal limits Gait & Station: normal Patient leans: N/A  Psychiatric Specialty Exam: Review of Systems  Last menstrual period 01/04/2021.There is no height or weight on file to calculate BMI.  General Appearance: Casual  Eye Contact:  Good  Speech:  Clear and Coherent  Volume:  Normal  Mood:  Anxious and Depressed  Affect:  Congruent  Thought Process:  Coherent  Orientation:  Full (Time, Place, and Person)  Thought Content:  Logical  Suicidal Thoughts:  No  Homicidal Thoughts:  No  Memory:  Immediate;   Fair Recent;   Fair Remote;   Fair  Judgement:   Fair  Insight:  Good  Psychomotor Activity:  Normal  Concentration:  Concentration: Good  Recall:  Good  Fund of Knowledge:Good  Language: Good  Akathisia:  No  Handed:  Left  AIMS (if indicated):  done  Assets:  Communication Skills  ADL's:  Intact  Cognition: WNL  Sleep:  Fair   Screenings: GAD-7    Forensic psychologist Health from 02/15/2021 in [facility] for [employer] at [facility] for [place] from 03/08/2020 in [facility] for [employer] at [facility] for [place] from 09/28/2019 in [place] for [facility] Office Visit from 09/26/2019 in [place] for [facility] Office Visit from 08/05/2019 in [place]  Total GAD-7 Score '19 14 18 11 21      '$ PHQ2-9    Flowsheet Row Counselor from 05/22/2021 in [place] from 02/15/2021 in [facility] for [place] at [facility] for [place] from 03/08/2020 in [facility] for [employer] at [facility] for [place] from 09/28/2019 in [place] for [facility] Office Visit from 09/26/2019 in [place] for [facility]  PHQ-2 Total Score '6 2 4 6 1  '$ PHQ-9 Total Score '27 15 15 21 9      '$ Flowsheet Row Counselor from 05/22/2021 in [place] Admission (Discharged) from 03/15/2021 in [place] Assessment Unit  C-SSRS RISK CATEGORY Error: Question 6 not populated No Risk       Assessment and Plan:  Patient to start intensive outpatient program Increase Zoloft 25 mg to 50 mg p.o. daily Continue hydroxyzine 25 mg p.o. 3 times daily as needed  Treatment plan was reviewed and agreed upon by NPT Chevelle Durr and [name] need for group services   Derrill Center, NP 8/17/202211:41 AM

## 2021-05-22 NOTE — Progress Notes (Signed)
Virtual Visit via Video Note  I connected with Joan Knight on '@TODAY'$ @ at  9:00 AM EDT by a video enabled telemedicine application and verified that I am speaking with the correct person using two identifiers.  Location: Patient: at home Provider: at office   I discussed the limitations of evaluation and management by telemedicine and the availability of in person appointments. The patient expressed understanding and agreed to proceed.  I discussed the assessment and treatment plan with the patient. The patient was provided an opportunity to ask questions and all were answered. The patient agreed with the plan and demonstrated an understanding of the instructions.   The patient was advised to call back or seek an in-person evaluation if the symptoms worsen or if the condition fails to improve as anticipated.  I provided 20 minutes of non-face-to-face time during this encounter.   Dellia Nims, M.Ed,CNA   Patient ID: Joan Knight, female   DOB: 1986-09-05, 35 y.o.   MRN: [id number] As per recent CCA states:  Patient is a 35 year old female presenting voluntarily to [facility] reporting increased depression, anxiety, and suicidal ideation.She is accompanied by her father, Jori Moll, who is present for evaluation at request of patient and provides collateral information. She reports a prior diagnosis of MDD and GAD but her symptoms have become more severe since she suffered a miscarriage 4 weeks ago. She states her boyfriend blames her for the miscarriage and she feels guilty, despite the fact there was nothing she could have done to prevent it.  Patient endorses passive suicidal ideation with thoughts of,"life isn't worth living" and "everyone would be better off if I wasn't here." She denies any active plan, prior attempts, or history of self harming behavior. She denies HI/AVH. She states last night when she was sleeping she could hear her own voice stating, "Just go ahead and do it. You don't need  to be here anymore." Patient additionally states she has been self-medicating with THC since the miscarriage, after not having used in over 1 year. She is not currently followed by an outpatient provider or prescribed psychiatric medications.  Pt started MH-IOP today.  States her first day was great.  "I am so glad to be in a group to talk about what all is going on with me."  Pt continues to deny any SI/HI or A/V hallucinations.  Admits to still self-medicating with THC.  A:  Oriented pt.  Pt gave verbal consent for treatment to release chart information to referred providers and to complete any forms if needed.  Pt also gave consent for attending group virtually d/t COVID-19 social distancing restrictions.  Encouraged support groups.  Will refer pt to a psychiatrist and therapist if she doesn't already have providers.  RTW on 06-17-21 without any restrictions.  R:  Pt receptive.   Dellia Nims, M.Ed,CNA

## 2021-05-23 ENCOUNTER — Other Ambulatory Visit: Payer: Self-pay

## 2021-05-23 ENCOUNTER — Encounter (HOSPITAL_COMMUNITY): Payer: Self-pay

## 2021-05-23 ENCOUNTER — Other Ambulatory Visit (HOSPITAL_COMMUNITY): Payer: 59 | Admitting: Psychiatry

## 2021-05-23 DIAGNOSIS — F332 Major depressive disorder, recurrent severe without psychotic features: Secondary | ICD-10-CM

## 2021-05-23 DIAGNOSIS — F17210 Nicotine dependence, cigarettes, uncomplicated: Secondary | ICD-10-CM | POA: Diagnosis not present

## 2021-05-23 NOTE — Progress Notes (Signed)
Virtual Visit via Video Note  I connected with Joan Knight on 05/23/21 at  9:00 AM EDT by a video enabled telemedicine application and verified that I am speaking with the correct person using two identifiers.  At orientation to the IOP program, Case Manager discussed the limitations of evaluation and management by telemedicine and the availability of in person appointments. The patient expressed understanding and agreed to proceed with virtual visits throughout the duration of the program.   Location:  Patient: Patient Home Provider: Home Office   History of Present Illness: MDD  Observations/Objective: Check In: Case Manager checked in with all participants to review discharge dates, insurance authorizations, work-related documents and needs from the treatment team regarding medications. Client stated needs and engaged in discussion. Case Manager introduced a new Client, with group members welcoming and starting the joining process.   Initial Therapeutic Activity: Counselor facilitated a check-in with group members to assess mood and current functioning. Client shared details of their mental health management since our last session, including challenges and successes. Counselor engaged group in discussion, covering the following topics: life transitions, combatting depression, personal supports, group process and coping skill application. Client presents with moderate depression and moderate anxiety. Client denied any current SI/HI/psychosis.  Second Therapeutic Activity: Counselor introduced [name], [place] Chaplain to present information and discussion on Grief and Loss. Group members engaged in discussion, sharing how grief impacts them, what comforts them, what emotions are felt, labeling losses, etc. After guest speaker logged off, Counselor prompted group to spend 10-15 minutes journaling to process personal grief and loss situations. Counselor processed entries with group and  client's identified areas for additional processing in individual therapy. Client noted grief related to loss of pregnancy and step mother.  Check Out: Counselor prompted group members to identify one self-care practice or productivity activity they would like to engage in today. Client plans to go outside for a walk. Client endorsed safety plan to be followed to prevent safety issues.  Assessment and Plan: Clinician recommends that Client remain in IOP treatment to better manage mental health symptoms, stabilization and to address treatment plan goals. Clinician recommends adherence to crisis/safety plan, taking medications as prescribed, and following up with medical professionals if any issues arise.    Follow Up Instructions: Clinician will send Webex link for next session. The Client was advised to call back or seek an in-person evaluation if the symptoms worsen or if the condition fails to improve as anticipated.     I provided 180 minutes of non-face-to-face time during this encounter.     Lise Auer, LCSW

## 2021-05-24 ENCOUNTER — Other Ambulatory Visit (HOSPITAL_COMMUNITY): Payer: 59 | Admitting: Psychiatry

## 2021-05-24 ENCOUNTER — Other Ambulatory Visit: Payer: Self-pay

## 2021-05-24 DIAGNOSIS — F332 Major depressive disorder, recurrent severe without psychotic features: Secondary | ICD-10-CM

## 2021-05-27 ENCOUNTER — Other Ambulatory Visit: Payer: Self-pay

## 2021-05-27 ENCOUNTER — Encounter (HOSPITAL_COMMUNITY): Payer: Self-pay

## 2021-05-27 ENCOUNTER — Other Ambulatory Visit (HOSPITAL_COMMUNITY): Payer: 59

## 2021-05-27 NOTE — Progress Notes (Signed)
Virtual Visit via Video Note  I connected with [name] on 05/27/21 at  9:00 AM EDT by a video enabled telemedicine application and verified that I am speaking with the correct person using two identifiers.  At orientation to the IOP program, Case Manager discussed the limitations of evaluation and management by telemedicine and the availability of in person appointments. The patient expressed understanding and agreed to proceed with virtual visits throughout the duration of the program.   Location:  Patient: Patient Home Provider: Home Office   History of Present Illness: MDD  Observations/Objective: Check In: Case Manager checked in with all participants to review discharge dates, insurance authorizations, work-related documents and needs from the treatment team regarding medications. Client stated needs and engaged in discussion.   Initial Therapeutic Activity: Counselor facilitated a check-in with group members to assess mood and current functioning. Client shared details of their mental health management since our last session, including challenges and successes. Counselor engaged group in discussion, covering the following topics: life transitions, combatting depression, personal supports, group process and coping skill application. Client presents with moderate depression and moderate anxiety. Client denied any current SI/HI/psychosis.  Second Therapeutic Activity: Counselor engaged group in a [employer], where group members and Counselor shared a variety of community and therapeutic resources. Counselor to send exhaustive list to group for future reference. Counselor introduced [employer], representative with [employer] to share about programming. Group Members asked questions and engaged in discussion, as [name] shared about Peer Support, Support Groups and the [employer]. Client stated that they are interested in connecting with the anger management and the women's  groups.  Check Out: Counselor prompted group members to identify one self-care practice or productivity activity they would like to engage in this weekend. Client plans to spend time with a close relative. Client endorsed safety plan to be followed to prevent safety issues.  Assessment and Plan: Clinician recommends that Client remain in IOP treatment to better manage mental health symptoms, stabilization and to address treatment plan goals. Clinician recommends adherence to crisis/safety plan, taking medications as prescribed, and following up with medical professionals if any issues arise.    Follow Up Instructions: Clinician will send Webex link for next session. The Client was advised to call back or seek an in-person evaluation if the symptoms worsen or if the condition fails to improve as anticipated.     I provided 180 minutes of non-face-to-face time during this encounter.     Lise Auer, LCSW

## 2021-05-28 ENCOUNTER — Other Ambulatory Visit (HOSPITAL_COMMUNITY): Payer: 59 | Admitting: Psychiatry

## 2021-05-28 ENCOUNTER — Other Ambulatory Visit: Payer: Self-pay

## 2021-05-28 ENCOUNTER — Encounter (HOSPITAL_COMMUNITY): Payer: Self-pay

## 2021-05-28 DIAGNOSIS — F332 Major depressive disorder, recurrent severe without psychotic features: Secondary | ICD-10-CM

## 2021-05-28 NOTE — Progress Notes (Signed)
Virtual Visit via Video Note  I connected with [name] on 05/28/21 at  9:00 AM EDT by a video enabled telemedicine application and verified that I am speaking with the correct person using two identifiers.  At orientation to the IOP program, Case Manager discussed the limitations of evaluation and management by telemedicine and the availability of in person appointments. The patient expressed understanding and agreed to proceed with virtual visits throughout the duration of the program.   Location:  Patient: Patient Home Provider: Home Office   History of Present Illness: MDD  Observations/Objective: Check In: Case Manager checked in with all participants to review discharge dates, insurance authorizations, work-related documents and needs from the treatment team regarding medications. Client stated needs and engaged in discussion.   Initial Therapeutic Activity: Counselor facilitated a check-in with group members to assess mood and current functioning. Client shared details of their mental health management since our last session, including challenges and successes. Counselor engaged group in discussion, covering the following topics: [employer], [place], boundaries, assertive communication, relational dynamics and traumas and self-care. Client presents with moderate depression and moderate anxiety. Client denied any current SI/HI/psychosis.  Second Therapeutic Activity: Estill Bamberg: Counselor introduced [name], [place] Chaplain to present information and discussion on Grief and Loss. Group members engaged in discussion, sharing how grief impacts them, what comforts them, what emotions are felt, labeling losses, etc. After guest speaker logged off, Counselor prompted group to spend 10-15 minutes journaling to process personal grief and loss situations. Counselor processed entries with group and client's identified areas for additional processing in individual therapy.   Check  Out: Counselor prompted group members to identify one self-care practice or productivity activity they would like to engage in this today. Counselor encouraged Client to be kind and compassionate with self, as grief and loss usually will bring up residual thoughts and feelings. Client endorsed safety plan to be followed to prevent safety issues.  Assessment and Plan: Clinician recommends that Client remain in IOP treatment to better manage mental health symptoms, stabilization and to address treatment plan goals. Clinician recommends adherence to crisis/safety plan, taking medications as prescribed, and following up with medical professionals if any issues arise.    Follow Up Instructions: Clinician will send Webex link for next session. The Client was advised to call back or seek an in-person evaluation if the symptoms worsen or if the condition fails to improve as anticipated.     I provided 180 minutes of non-face-to-face time during this encounter.     Lise Auer, LCSW

## 2021-05-29 ENCOUNTER — Other Ambulatory Visit (HOSPITAL_COMMUNITY): Payer: 59 | Admitting: Psychiatry

## 2021-05-29 ENCOUNTER — Other Ambulatory Visit: Payer: Self-pay

## 2021-05-29 DIAGNOSIS — F332 Major depressive disorder, recurrent severe without psychotic features: Secondary | ICD-10-CM

## 2021-05-30 ENCOUNTER — Encounter (HOSPITAL_COMMUNITY): Payer: Self-pay

## 2021-05-30 ENCOUNTER — Other Ambulatory Visit: Payer: Self-pay

## 2021-05-30 ENCOUNTER — Other Ambulatory Visit (HOSPITAL_COMMUNITY): Payer: 59 | Admitting: Psychiatry

## 2021-05-30 DIAGNOSIS — F332 Major depressive disorder, recurrent severe without psychotic features: Secondary | ICD-10-CM

## 2021-05-30 NOTE — Progress Notes (Signed)
Virtual Visit via Video Note  I connected with [name] on 05/30/21 at  9:00 AM EDT by a video enabled telemedicine application and verified that I am speaking with the correct person using two identifiers.  At orientation to the IOP program, Case Manager discussed the limitations of evaluation and management by telemedicine and the availability of in person appointments. The patient expressed understanding and agreed to proceed with virtual visits throughout the duration of the program.   Location:  Patient: Patient Home Provider: Home Office   History of Present Illness: MDD  Observations/Objective: Check In: Case Manager checked in with all participants to review discharge dates, insurance authorizations, work-related documents and needs from the treatment team regarding medications. Client stated needs and engaged in discussion. Case Manager introduced a new Client to the group, with group members welcoming and starting the joining process.   Initial Therapeutic Activity: Counselor facilitated a check-in with group members to assess mood and current functioning. Client shared details of their mental health management since our last session, including challenges and successes. Counselor engaged group in discussion, covering the following topics: boundary setting, preparing for return to work, [employer] for mental health in various environments, and engaging support systems. Client presents with moderate depression and moderate anxiety. Client denied any current SI/HI/psychosis.  Second Therapeutic Activity: Counselor presented 5 alternative breathing techniques to traditional deep breathing. Counselor walked group members through each exercise, with group members sharing feedback on short term results. Client noted that she felt more relaxed and enjoyed learning new techniques. Counselor engaged group members in a guided imagery called, Relaxation for Self-esteem. The practice  incorporated positive affirmations, deep breathing, muscle relaxation and visualizations. Client remarked that it was relaxing and energizing, igniting inner confidence.   Check Out: Counselor prompted group members to identify one self-care practice or productivity activity they would like to engage in this today. Client plans to do self-care by styling hair, doing nails and meeting a friend for dinner. Client endorsed safety plan to be followed to prevent safety issues.  Assessment and Plan: Clinician recommends that Client remain in IOP treatment to better manage mental health symptoms, stabilization and to address treatment plan goals. Clinician recommends adherence to crisis/safety plan, taking medications as prescribed, and following up with medical professionals if any issues arise.    Follow Up Instructions: Clinician will send Webex link for next session. The Client was advised to call back or seek an in-person evaluation if the symptoms worsen or if the condition fails to improve as anticipated.     I provided 180 minutes of non-face-to-face time during this encounter.     Lise Auer, LCSW

## 2021-05-30 NOTE — Progress Notes (Signed)
Virtual Visit via Video Note  I connected with Joan Knight on 05/29/21 at  9:00 AM EDT by a video enabled telemedicine application and verified that I am speaking with the correct person using two identifiers.  At orientation to the IOP program, Case Manager discussed the limitations of evaluation and management by telemedicine and the availability of in person appointments. The patient expressed understanding and agreed to proceed with virtual visits throughout the duration of the program.   Location:  Patient: Patient Home Provider: Home Office   History of Present Illness: MDD  Observations/Objective: Check In: Case Manager checked in with all participants to review discharge dates, insurance authorizations, work-related documents and needs from the treatment team regarding medications. Client stated needs and engaged in discussion. Case Manager introduced a new Client to the group, with group members welcoming and starting the joining process.   Initial Therapeutic Activity: Counselor facilitated a check-in with group members to assess mood and current functioning. Client shared details of their mental health management since our last session, including challenges and successes. Counselor engaged group in discussion, covering the following topics: daily management of MH and local resources. Client presents with moderate depression and moderate anxiety. Client denied any current SI/HI/psychosis.  Second Therapeutic Activity: Counselor introduced our guest speaker, Einar Grad, [employer] Pharmacist, who shared about psychiatric medications, side effects, treatment considerations and how to communicate with medical professionals. Group Members asked questions and shared medication concerns. Counselor prompted group members to reference a worksheet called, "Body Scan" to jot down questions and concerns about their physical health in preparation for their upcoming appointments with medical  professionals. Client reports need for dental and eye exams. Counselor encouraged routine medical check-ups, preparing for appointments, following up with recommendations and seeking specialist if needed.   Check Out: Counselor closed program by allowing time to celebrate a graduating group member. Counselor shared reflections on progress and allow space for group members to share well wishes and encouragements with the graduating client. Counselor prompted graduating client to share takeaways, reflect on progress and final thoughts for the group. Counselor prompted group members to identify one self-care practice or productivity activity they would like to engage in this today. Client endorsed safety plan to be followed to prevent safety issues.  Assessment and Plan: Clinician recommends that Client remain in IOP treatment to better manage mental health symptoms, stabilization and to address treatment plan goals. Clinician recommends adherence to crisis/safety plan, taking medications as prescribed, and following up with medical professionals if any issues arise.    Follow Up Instructions: Clinician will send Webex link for next session. The Client was advised to call back or seek an in-person evaluation if the symptoms worsen or if the condition fails to improve as anticipated.     I provided 180 minutes of non-face-to-face time during this encounter.     Lise Auer, LCSW

## 2021-05-31 ENCOUNTER — Other Ambulatory Visit (HOSPITAL_COMMUNITY): Payer: 59 | Admitting: Psychiatry

## 2021-05-31 ENCOUNTER — Encounter (HOSPITAL_COMMUNITY): Payer: Self-pay

## 2021-05-31 ENCOUNTER — Other Ambulatory Visit: Payer: Self-pay

## 2021-05-31 DIAGNOSIS — F332 Major depressive disorder, recurrent severe without psychotic features: Secondary | ICD-10-CM

## 2021-05-31 NOTE — Progress Notes (Signed)
Virtual Visit via Video Note  I connected with Joan Knight on 05/31/21 at  9:00 AM EDT by a video enabled telemedicine application and verified that I am speaking with the correct person using two identifiers.  At orientation to the IOP program, Case Manager discussed the limitations of evaluation and management by telemedicine and the availability of in person appointments. The patient expressed understanding and agreed to proceed with virtual visits throughout the duration of the program.   Location:  Patient: Patient Home Provider: Home Office   History of Present Illness: MDD  Observations/Objective: Check In: Case Manager checked in with all participants to review discharge dates, insurance authorizations, work-related documents and needs from the treatment team regarding medications. Client stated needs and engaged in discussion.   Initial Therapeutic Activity: Counselor facilitated a check-in with group members to assess mood and current functioning. Client shared details of their mental health management since our last session, including challenges and successes. Counselor engaged group in discussion, covering the following topics: sleep issues and sleep hygiene, changing careers to support mental health, incorporating movement, socialization and medication concerns. Client presents with moderate depression and moderate anxiety. Client denied any current SI/HI/psychosis.  Second Therapeutic Activity: Counselor presented psychoeducation on the anxiety cycle and the depression cycle. Counselor prompted group members to compare and contrast their depressive symptoms. Group members shared their reflections, highlighting most impactful symptoms. Counselor discussed how to communicate about symptoms with providers and to use as signals to address mental health. Counselor shared a video on nighttime anxiety strategies. Client remarked that the information was helpful and engaged well in  exercise and discussion.   Check Out: Counselor closed program by allowing time to celebrate several graduating group members. Counselor shared reflections on progress and allow space for group members to share well wishes and encouragements with the graduating client. Counselor prompted graduating client to share takeaways, reflect on progress and final thoughts for the group. Client endorsed safety plan to be followed to prevent safety issues.  Assessment and Plan: Clinician recommends that Client remain in IOP treatment to better manage mental health symptoms, stabilization and to address treatment plan goals. Clinician recommends adherence to crisis/safety plan, taking medications as prescribed, and following up with medical professionals if any issues arise.    Follow Up Instructions: Clinician will send Webex link for next session. The Client was advised to call back or seek an in-person evaluation if the symptoms worsen or if the condition fails to improve as anticipated.     I provided 180 minutes of non-face-to-face time during this encounter.     Lise Auer, LCSW

## 2021-06-04 ENCOUNTER — Encounter (HOSPITAL_COMMUNITY): Payer: Self-pay

## 2021-06-04 ENCOUNTER — Other Ambulatory Visit: Payer: Self-pay

## 2021-06-04 ENCOUNTER — Other Ambulatory Visit (HOSPITAL_COMMUNITY): Payer: 59 | Admitting: Psychiatry

## 2021-06-04 DIAGNOSIS — F332 Major depressive disorder, recurrent severe without psychotic features: Secondary | ICD-10-CM

## 2021-06-04 NOTE — Progress Notes (Signed)
Virtual Visit via Video Note  I connected with Joan Knight on 06/04/21 at  9:00 AM EDT by a video enabled telemedicine application and verified that I am speaking with the correct person using two identifiers.  At orientation to the IOP program, Case Manager discussed the limitations of evaluation and management by telemedicine and the availability of in person appointments. The patient expressed understanding and agreed to proceed with virtual visits throughout the duration of the program.   Location:  Patient: Patient Home Provider: Home Office   History of Present Illness: MDD  Observations/Objective: Check In: Case Manager checked in with all participants to review discharge dates, insurance authorizations, work-related documents and needs from the treatment team regarding medications. Client stated needs and engaged in discussion. Case Manager introduced a new group member, with group welcoming and starting the joining process.  Initial Therapeutic Activity: Counselor facilitated a check-in with group members to assess mood and current functioning. Client shared details of their mental health management since our last session, including challenges and successes. Counselor engaged group in discussion, covering the following topics: combatting anxiety, self-care practices, medication issues, and grounding practices-5 senses, scattered counting, backwards spelling, and mindful walking. Client presents with moderate depression and moderate anxiety. Client denied any current SI/HI/psychosis.  Second Therapeutic Activity: Counselor introduced [name], [place] Chaplain to present information and discussion on Grief and Loss. Group members engaged in discussion, sharing how grief impacts them, what comforts them, what emotions are felt, labeling losses, etc. After guest speaker logged off, Counselor prompted group to spend 10-15 minutes journaling to process personal grief and loss  situations. Counselor processed entries with group and client's identified areas for additional processing in individual therapy. Client noted loss related to miscarriage and rediscovering self.  Check Out: Counselor prompted group members to identify one self-care practice or productivity activity they would like to engage in today. Client plans to rest and contact provider about GI issues. Client endorsed safety plan to be followed to prevent safety issues.  Assessment and Plan: Clinician recommends that Client remain in IOP treatment to better manage mental health symptoms, stabilization and to address treatment plan goals. Clinician recommends adherence to crisis/safety plan, taking medications as prescribed, and following up with medical professionals if any issues arise.    Follow Up Instructions: Clinician will send Webex link for next session. The Client was advised to call back or seek an in-person evaluation if the symptoms worsen or if the condition fails to improve as anticipated.     I provided 180 minutes of non-face-to-face time during this encounter.     Lise Auer, LCSW

## 2021-06-05 ENCOUNTER — Other Ambulatory Visit (HOSPITAL_COMMUNITY): Payer: 59 | Admitting: Psychiatry

## 2021-06-05 ENCOUNTER — Other Ambulatory Visit: Payer: Self-pay

## 2021-06-05 DIAGNOSIS — F332 Major depressive disorder, recurrent severe without psychotic features: Secondary | ICD-10-CM | POA: Diagnosis not present

## 2021-06-06 ENCOUNTER — Other Ambulatory Visit: Payer: Self-pay

## 2021-06-06 ENCOUNTER — Other Ambulatory Visit (HOSPITAL_COMMUNITY): Payer: 59 | Attending: Psychiatry | Admitting: Psychiatry

## 2021-06-06 ENCOUNTER — Encounter (HOSPITAL_COMMUNITY): Payer: Self-pay

## 2021-06-06 DIAGNOSIS — F332 Major depressive disorder, recurrent severe without psychotic features: Secondary | ICD-10-CM | POA: Insufficient documentation

## 2021-06-06 NOTE — Progress Notes (Signed)
Virtual Visit via Video Note  I connected with Joan Knight on 06/06/21 at  9:00 AM EDT by a video enabled telemedicine application and verified that I am speaking with the correct person using two identifiers.  At orientation to the IOP program, Case Manager discussed the limitations of evaluation and management by telemedicine and the availability of in person appointments. The patient expressed understanding and agreed to proceed with virtual visits throughout the duration of the program.   Location:  Patient: Patient Home Provider: Home Office   History of Present Illness: MDD  Observations/Objective: Check In: Case Manager checked in with all participants to review discharge dates, insurance authorizations, work-related documents and needs from the treatment team regarding medications. Client stated needs and engaged in discussion.   Initial Therapeutic Activity: Counselor facilitated a check-in with group members to assess mood and current functioning. Client shared details of their mental health management since our last session, including challenges and successes. Counselor engaged group in discussion, covering the following topics: coping mechanisms that distract-coloring, journaling, gaming, word puzzles and how to properly utilize coping skills as to not become dependent on them Client presents with moderate depression and moderate anxiety. Client denied any current SI/HI/psychosis.  Second Therapeutic Activity: Counselor provided psychoeducation on Cognitive Distortions with the group. Counselor introduced terms and types utilizing a video from [employer]. Counselor then processed information with group members. Client identified that they related most with blame, fallacy of fairness, shoulds, generalizing and catastrophizing styles of distorted thinking. Counselor discussed the importance of retaining the brain to calm emotional thinking to engage in logical, rational and more  positive/kind thought processes. Counselor provided a variety of worksheets targeting CBT interventions for group to do as homework.  Check Out: Counselor prompted group members to identify one self-care practice or productivity activity they would like to engage in today. Client endorsed safety plan to be followed to prevent safety issues.  Assessment and Plan: Clinician recommends that Client remain in IOP treatment to better manage mental health symptoms, stabilization and to address treatment plan goals. Clinician recommends adherence to crisis/safety plan, taking medications as prescribed, and following up with medical professionals if any issues arise.    Follow Up Instructions: Clinician will send Webex link for next session. The Client was advised to call back or seek an in-person evaluation if the symptoms worsen or if the condition fails to improve as anticipated.     I provided 180 minutes of non-face-to-face time during this encounter.     Joan Auer, LCSW

## 2021-06-06 NOTE — Progress Notes (Signed)
Virtual Visit via Video Note  I connected with Joan Knight on 06/05/21 at  9:00 AM EDT by a video enabled telemedicine application and verified that I am speaking with the correct person using two identifiers.  At orientation to the IOP program, Case Manager discussed the limitations of evaluation and management by telemedicine and the availability of in person appointments. The patient expressed understanding and agreed to proceed with virtual visits throughout the duration of the program.   Location:  Patient: Patient Home Provider: Home Office   History of Present Illness: MDD  Observations/Objective: Check In: Case Manager checked in with all participants to review discharge dates, insurance authorizations, work-related documents and needs from the treatment team regarding medications. Client stated needs and engaged in discussion.   Initial Therapeutic Activity: Counselor facilitated a check-in with group members to assess mood and current functioning. Client shared details of their mental health management since our last session, including challenges and successes. Counselor engaged group in discussion, covering the following topics: medical issues that impact mental health, communication skills, boundary setting, finding the positives, grief and loss and using music as an outlet. Client presents with moderate depression and moderate anxiety. Client denied any current SI/HI/psychosis.  Second Therapeutic Activity: Counselor introduced an activity called the "Inside Out Mask". Counselor provided psychoeducation on the concept of "masking mental health". Group members shared about how they have engaged in this behavior in a variety of settings. Counselor prompted group members to create an image symbolizing what they portray on the outside to the world, vs what they are experiencing internally. Client shared about her internal emotions, pain and conflicts, and how on the outside she strives  for people to think that everything is ok, noting that it is hard to hold all emotions in at this point. Counselor provided psychoeducation about unmasking and getting help for internal emotional distress.   Check Out: Counselor prompted group members to identify one self-care practice or productivity activity they would like to engage in today. Client endorsed safety plan to be followed to prevent safety issues.  Assessment and Plan: Clinician recommends that Client remain in IOP treatment to better manage mental health symptoms, stabilization and to address treatment plan goals. Clinician recommends adherence to crisis/safety plan, taking medications as prescribed, and following up with medical professionals if any issues arise.    Follow Up Instructions: Clinician will send Webex link for next session. The Client was advised to call back or seek an in-person evaluation if the symptoms worsen or if the condition fails to improve as anticipated.     I provided 180 minutes of non-face-to-face time during this encounter.     Lise Auer, LCSW

## 2021-06-07 ENCOUNTER — Other Ambulatory Visit: Payer: Self-pay

## 2021-06-07 ENCOUNTER — Other Ambulatory Visit (HOSPITAL_COMMUNITY): Payer: 59 | Admitting: Psychiatry

## 2021-06-07 DIAGNOSIS — F332 Major depressive disorder, recurrent severe without psychotic features: Secondary | ICD-10-CM | POA: Diagnosis not present

## 2021-06-11 ENCOUNTER — Other Ambulatory Visit: Payer: Self-pay

## 2021-06-11 ENCOUNTER — Other Ambulatory Visit (HOSPITAL_COMMUNITY): Payer: 59 | Admitting: Family

## 2021-06-11 DIAGNOSIS — F332 Major depressive disorder, recurrent severe without psychotic features: Secondary | ICD-10-CM | POA: Diagnosis not present

## 2021-06-11 NOTE — Patient Instructions (Signed)
D:  Pt will complete MH-IOP tomorrow (06-12-21).  A:  Follow up with list of providers that was given.  Strongly recommended grief/loss group.  Return to work on 06-17-21.  R:  Patient receptive.

## 2021-06-12 ENCOUNTER — Encounter (HOSPITAL_COMMUNITY): Payer: Self-pay | Admitting: Family

## 2021-06-12 ENCOUNTER — Encounter (HOSPITAL_COMMUNITY): Payer: Self-pay

## 2021-06-12 ENCOUNTER — Other Ambulatory Visit (HOSPITAL_COMMUNITY): Payer: 59 | Admitting: Psychiatry

## 2021-06-12 ENCOUNTER — Other Ambulatory Visit: Payer: Self-pay

## 2021-06-12 ENCOUNTER — Telehealth (HOSPITAL_COMMUNITY): Payer: Self-pay | Admitting: Psychiatry

## 2021-06-12 NOTE — Progress Notes (Signed)
Virtual Visit via Telephone Note  I connected with Joan Knight on '@TODAY'$ @ at  9:00 AM EDT by telephone and verified that I am speaking with the correct person using two identifiers.  Location: Patient: at home Provider: at office   I discussed the limitations, risks, security and privacy concerns of performing an evaluation and management service by telephone and the availability of in person appointments. I also discussed with the patient that there may be a patient responsible charge related to this service. The patient expressed understanding and agreed to proceed.  I discussed the assessment and treatment plan with the patient. The patient was provided an opportunity to ask questions and all were answered. The patient agreed with the plan and demonstrated an understanding of the instructions.   The patient was advised to call back or seek an in-person evaluation if the symptoms worsen or if the condition fails to improve as anticipated.  I provided 20 minutes of non-face-to-face time during this encounter.   Dellia Nims, M.Ed,CNA   Patient ID: Joan Knight, female   DOB: [birth year], 35 y.o.   MRN: [id number] As per recent CCA states:  Patient is a 35 year old female presenting voluntarily to [facility] reporting increased depression, anxiety, and suicidal ideation.She is accompanied by her father, Jori Moll, who is present for evaluation at request of patient and provides collateral information. She reports a prior diagnosis of MDD and GAD but her symptoms have become more severe since she suffered a miscarriage 4 weeks ago. She states her boyfriend blames her for the miscarriage and she feels guilty, despite the fact there was nothing she could have done to prevent it.  Patient endorses passive suicidal ideation with thoughts of,"life isn't worth living" and "everyone would be better off if I wasn't here." She denies any active plan, prior attempts, or history of self harming behavior. She  denies HI/AVH. She states last night when she was sleeping she could hear her own voice stating, "Just go ahead and do it. You don't need to be here anymore." Patient additionally states she has been self-medicating with THC since the miscarriage, after not having used in over 1 year. She is not currently followed by an outpatient provider or prescribed psychiatric medications.   Pt started MH-IOP today.  States her first day was great.  "I am so glad to be in a group to talk about what all is going on with me."  Pt continues to deny any SI/HI or A/V hallucinations.  Admits to still self-medicating with THC.    Pt attended all scheduled days.  Reports feeling much better.  Still grieving loss of her baby and working thru stages of grief.  Reports she will be attending a grief/loss group on 07-22-21.  On a scale of 1-10 (10 being the worst); she rates her anxiety and depression at a 5.  Denies SI/HI and A/V hallucinations.  Reports that the groups were helpful. A:  Discharge today.  Pt will f/u with a list of providers that was given to her.  RTW on 06-17-21 without any restrictions.  R:  Pt receptive.     Dellia Nims, M.Ed,CNA

## 2021-06-12 NOTE — Progress Notes (Addendum)
Virtual Visit via Video Note  I connected with Joan Knight on 06/07/21 at  9:00 AM EDT by a video enabled telemedicine application and verified that I am speaking with the correct person using two identifiers.  At orientation to the IOP program, Case Manager discussed the limitations of evaluation and management by telemedicine and the availability of in person appointments. The patient expressed understanding and agreed to proceed with virtual visits throughout the duration of the program.   Location:  Patient: Patient Home Provider: Home Office   History of Present Illness: MDD  Observations/Objective: Check In: Case Manager checked in with all participants to review discharge dates, insurance authorizations, work-related documents and needs from the treatment team regarding medications. Client stated needs and engaged in discussion.   Initial Therapeutic Activity: Counselor facilitated a check-in with group members to assess mood and current functioning. Client shared details of their mental health management since our last session, including challenges and successes. Counselor engaged group in discussion, covering the following topics: reviewed crisis safety plan, made revisions and shared community crisis services, relaxation techniques, boundary setting, and protective factors. Client presents with moderate depression and moderate anxiety. Client denied any current SI/HI/psychosis.  Second Therapeutic Activity: Counselor introduced [employer], representative with [employer] to share about programming. Group Members asked questions and engaged in discussion, as Joan Knight shared about Peer Support, Support Groups and the [employer]. Client stated that they are interested in connecting with the womens support groups and peer support.  Check Out: Counselor closed program by allowing time to celebrate a graduating group member. Counselor shared reflections on progress and allow space  for group members to share well wishes and encouragements with the graduating client. Counselor prompted graduating client to share takeaways, reflect on progress and final thoughts for the group. Client endorsed safety plan to be followed to prevent safety issues.  Assessment and Plan: Clinician recommends that Client remain in IOP treatment to better manage mental health symptoms, stabilization and to address treatment plan goals. Clinician recommends adherence to crisis/safety plan, taking medications as prescribed, and following up with medical professionals if any issues arise.    Follow Up Instructions: Clinician will send Webex link for next session. The Client was advised to call back or seek an in-person evaluation if the symptoms worsen or if the condition fails to improve as anticipated.     I provided 180 minutes of non-face-to-face time during this encounter.     Lise Auer, LCSW

## 2021-06-12 NOTE — Progress Notes (Signed)
Virtual Visit via Video Note  I connected with Joan Knight on 06/11/21 at  9:00 AM EDT by a video enabled telemedicine application and verified that I am speaking with the correct person using two identifiers.  At orientation to the IOP program, Case Manager discussed the limitations of evaluation and management by telemedicine and the availability of in person appointments. The patient expressed understanding and agreed to proceed with virtual visits throughout the duration of the program.   Location:  Patient: Patient Home Provider: Home Office   History of Present Illness: MDD  Observations/Objective: Check In: Case Manager checked in with all participants to review discharge dates, insurance authorizations, work-related documents and needs from the treatment team regarding medications. Client stated needs and engaged in discussion.   Initial Therapeutic Activity: Counselor facilitated a check-in with group members to assess mood and current functioning. Client shared details of their mental health management since our last session, including challenges and successes. Counselor engaged group in discussion, covering the following topics: self-care application, coping strategies for anxiety, and future planning. Client presents with mild depression and mild anxiety. Client denied any current SI/HI/psychosis.  Second Therapeutic Activity: Counselor prompted group members to reflect on stress and problems in the workplace that impact or contributed to their mental health crisis. Group members shared their responses. Counselor provided psychoeducation on strategies for managing stress in the workplace, through 2 videos highlighting cognitive and behavioral approaches to alleviating and preventing stress. Counselor prompted clients to share their takeaways and processed practical application of strategies in their respective work environments. Counselor provided additional information via online  articles.  Check Out: Counselor prompted group members to identify one self-care practice or productivity activity they would like to engage in today. Client plans to rest and nap. Client endorsed safety plan to be followed to prevent safety issues.  Assessment and Plan: Clinician recommends that Client step down from IOP to individual therapy, medication management and community resources. Clinician recommends adherence to crisis/safety plan, taking medications as prescribed, and following up with medical professionals if any issues arise.    Follow Up Instructions: Clinician will send Webex link for next session. The Client was advised to call back or seek an in-person evaluation if the symptoms worsen or if the condition fails to improve as anticipated.     I provided 180 minutes of non-face-to-face time during this encounter.     Lise Auer, LCSW

## 2021-06-12 NOTE — Progress Notes (Signed)
Virtual Visit via Video Note  I connected with Joan Knight on 06/12/21 at  9:00 AM EDT by a video enabled telemedicine application and verified that I am speaking with the correct person using two identifiers.  Location: Patient: Home Provider: Office   I discussed the limitations of evaluation and management by telemedicine and the availability of in person appointments. The patient expressed understanding and agreed to proceed.    I discussed the assessment and treatment plan with the patient. The patient was provided an opportunity to ask questions and all were answered. The patient agreed with the plan and demonstrated an understanding of the instructions.   The patient was advised to call back or seek an in-person evaluation if the symptoms worsen or if the condition fails to improve as anticipated.  I provided 15 minutes of non-face-to-face time during this encounter.   Derrill Center, NP   [facility]ensive Outpatient Program Discharge Summary  Joan Knight [id number]  Admission date: 05/22/2021 Discharge date: 06/12/2021  Reason for admission: Per admission assessment note: Joan Knight is a 35 year old African-American female that presents with worsening depression and anxiety.  She reports " going through a lot tragedy" reports the passing of her stepmother who was married to her father for the past 25 years.  States her father's home is in foreclosure.  States her mother has an eviction notice.  And reports a miscarriage of her first child about 4 weeks prior.     She denied previous inpatient admissions.  Does admit to marijuana use to help with her anxiety.  States she has been self-medicating for the past 5 to 6 years.  Reports multiple crying spells, anxiety, poor concentration and mood irritability.  Reports she was followed by a therapist after her biological parents was divorced when she was 82 years old.  She was recently evaluated at the  local urgent care where she was initiated on Zoloft and hydroxyzine.  Progress in Program Toward Treatment Goals:  Joan Knight attended and participated with daily group session with active engaged participation.  She is denying suicidal or homicidal ideations.  Denies auditory or visual hallucinations.  She reported learning additional coping skills to help with her depression, anxiety and feelings of "loneliness".  Patient stated it is good to know that she felt alone.  Progress (rationale): Patient to keep all outpatient follow-up appointments, patient to consider follow-up with grief and loss counseling.   Take all medications as prescribed. Keep all follow-up appointments as scheduled.  Do not consume alcohol or use illegal drugs while on prescription medications. Report any adverse effects from your medications to your primary care provider promptly.  In the event of recurrent symptoms or worsening symptoms, call 911, a crisis hotline, or go to the nearest emergency department for evaluation.     Derrill Center, NP 06/12/2021

## 2021-06-13 ENCOUNTER — Other Ambulatory Visit: Payer: Self-pay

## 2021-06-13 ENCOUNTER — Other Ambulatory Visit (HOSPITAL_COMMUNITY): Payer: 59

## 2021-06-14 ENCOUNTER — Ambulatory Visit (HOSPITAL_COMMUNITY): Payer: 59

## 2021-06-17 ENCOUNTER — Other Ambulatory Visit: Payer: Self-pay

## 2021-06-17 ENCOUNTER — Ambulatory Visit (HOSPITAL_COMMUNITY): Payer: 59

## 2021-06-18 ENCOUNTER — Ambulatory Visit (HOSPITAL_COMMUNITY): Payer: 59

## 2021-06-19 ENCOUNTER — Ambulatory Visit (HOSPITAL_COMMUNITY): Payer: 59

## 2021-06-20 ENCOUNTER — Ambulatory Visit (HOSPITAL_COMMUNITY): Payer: 59

## 2021-06-21 ENCOUNTER — Ambulatory Visit (HOSPITAL_COMMUNITY): Payer: 59

## 2021-07-26 ENCOUNTER — Encounter (HOSPITAL_COMMUNITY): Payer: Self-pay

## 2021-07-26 ENCOUNTER — Emergency Department (HOSPITAL_COMMUNITY)
Admission: EM | Admit: 2021-07-26 | Discharge: 2021-07-26 | Disposition: A | Discharge status: Home / Self Care | Payer: 59 | Attending: Emergency Medicine | Admitting: Emergency Medicine

## 2021-07-26 ENCOUNTER — Emergency Department (HOSPITAL_COMMUNITY): Payer: 59

## 2021-07-26 DIAGNOSIS — K279 Peptic ulcer, site unspecified, unspecified as acute or chronic, without hemorrhage or perforation: Secondary | ICD-10-CM | POA: Diagnosis not present

## 2021-07-26 DIAGNOSIS — F17210 Nicotine dependence, cigarettes, uncomplicated: Secondary | ICD-10-CM | POA: Insufficient documentation

## 2021-07-26 DIAGNOSIS — M542 Cervicalgia: Secondary | ICD-10-CM | POA: Diagnosis not present

## 2021-07-26 DIAGNOSIS — J45909 Unspecified asthma, uncomplicated: Secondary | ICD-10-CM | POA: Insufficient documentation

## 2021-07-26 LAB — COMPREHENSIVE METABOLIC PANEL
ALT: 13 U/L (ref 0–44)
AST: 17 U/L (ref 15–41)
Albumin: 4.5 g/dL (ref 3.5–5.0)
Alkaline Phosphatase: 47 U/L (ref 38–126)
Anion gap: 7 (ref 5–15)
BUN: 14 mg/dL (ref 6–20)
CO2: 26 mmol/L (ref 22–32)
Calcium: 9.1 mg/dL (ref 8.9–10.3)
Chloride: 106 mmol/L (ref 98–111)
Creatinine, Ser: 0.71 mg/dL (ref 0.44–1.00)
GFR, Estimated: >60 mL/min (ref >60)
Glucose, Bld: 88 mg/dL (ref 70–99)
Potassium: 3.5 mmol/L (ref 3.5–5.1)
Sodium: 139 mmol/L (ref 135–145)
Total Bilirubin: 0.4 mg/dL (ref 0.3–1.2)
Total Protein: 7.7 g/dL (ref 6.5–8.1)

## 2021-07-26 LAB — CBC WITH DIFFERENTIAL/PLATELET
Abs Immature Granulocytes: 0.02 10*3/uL (ref 0.00–0.07)
Basophils Absolute: 0 10*3/uL (ref 0.0–0.1)
Basophils Relative: 1 %
Eosinophils Absolute: 0.1 10*3/uL (ref 0.0–0.5)
Eosinophils Relative: 1 %
HCT: 36.7 % (ref 36.0–46.0)
Hemoglobin: 13.1 g/dL (ref 12.0–15.0)
Immature Granulocytes: 0 %
Lymphocytes Relative: 39 %
Lymphs Abs: 2.2 10*3/uL (ref 0.7–4.0)
MCH: 30.3 pg (ref 26.0–34.0)
MCHC: 35.7 g/dL (ref 30.0–36.0)
MCV: 84.8 fL (ref 80.0–100.0)
Monocytes Absolute: 0.4 10*3/uL (ref 0.1–1.0)
Monocytes Relative: 7 %
Neutro Abs: 2.9 10*3/uL (ref 1.7–7.7)
Neutrophils Relative %: 52 %
Platelets: 340 10*3/uL (ref 150–400)
RBC: 4.33 MIL/uL (ref 3.87–5.11)
RDW: 13.9 % (ref 11.5–15.5)
WBC: 5.5 10*3/uL (ref 4.0–10.5)
nRBC: 0 % (ref 0.0–0.2)

## 2021-07-26 MED ORDER — KETOROLAC TROMETHAMINE 30 MG/ML IJ SOLN
60.0000 mg | Freq: Once | INTRAMUSCULAR | Status: AC
  Administered 2021-07-26: 60 mg via INTRAMUSCULAR
  Filled 2021-07-26: qty 2

## 2021-07-26 NOTE — Discharge Instructions (Signed)
Increase your Prilosec so you are taking it twice a day.  Follow-up with your family doctor for your stomach.  You have also been referred to an ENT doctor to look at your neck, formally called [facility] ENT phone number is [phone], call make an appointment for your neck

## 2021-07-26 NOTE — ED Provider Notes (Signed)
[facility] DEPT Provider Note   CSN: [id number] Arrival date & time: 07/26/21  0093     History Chief Complaint  Patient presents with   Neck Pain    Joan Knight is a 35 y.o. female.  Patient presents with neck discomfort.  She states she has had a lymph node that felt swollen but is better now.  She also has some epigastric discomfort and she has been taking Prilosec once a day  The history is provided by the patient and medical records.  Neck Pain Pain location:  Generalized neck Quality:  Aching Pain radiates to:  Does not radiate Pain severity:  Mild Pain is:  Same all the time Onset quality:  Gradual Timing:  Intermittent Progression:  Resolved Chronicity:  Recurrent Context: not fall   Relieved by:  Nothing Associated symptoms: no chest pain and no headaches       Past Medical History:  Diagnosis Date   Anemia    Anxiety    Depression    DVT (deep venous thrombosis) (HCC)    Had history of DVT in the right lower extremity about 5 years ago.  She tells me she was on blood thinner for about a year and then she stopped taking it.     Eczema    GERD (gastroesophageal reflux disease)    Nexplanon in place 02/24/2017   Sickle cell anemia (HCC)    Sickle cell trait (HCC)    Vaginal Pap smear, abnormal    "scraped or cut off" (?LEEP) no problems since    Patient Active Problem List   Diagnosis Date Noted   Tobacco abuse 09/26/2019   DVT (deep venous thrombosis) (HCC)    Hb-SS disease without crisis ([place]) 02/24/2017    Past Surgical History:  Procedure Laterality Date   LEEP     REMOVAL OF IMPLANON ROD  09/26/2019     OB History     Gravida  1   Para  0   Term  0   Preterm  0   AB  0   Living  0      SAB  0   IAB  0   Ectopic  0   Multiple  0   Live Births  0           Family History  Problem Relation Age of Onset   Asthma Mother    Anxiety disorder Mother    Depression Mother     Bronchitis Mother    Emphysema Mother    COPD Mother    Heart disease Father        heart murmur   Hypertension Father    Deep vein thrombosis Father    Depression Father    Heart Problems Father    Diabetes Father    Breast cancer Maternal Aunt    Congenital heart disease Maternal Grandmother    Bone cancer Maternal Grandfather    Pancreatic cancer Neg Hx    Stomach cancer Neg Hx    Esophageal cancer Neg Hx    Colon cancer Neg Hx     Social History   Tobacco Use   Smoking status: Every Day    Packs/day: 0.25    Years: 16.00    Pack years: 4.00    Types: Cigarettes   Smokeless tobacco: Never   Tobacco comments:    Cutting back, plans to quit   Vaping Use   Vaping Use: Some days   Substances: Nicotine  Substance Use Topics   Alcohol use: Not Currently    Comment: occ/social   Drug use: No    Home Medications Prior to Admission medications   Medication Sig Start Date End Date Taking? Authorizing Provider  acetaminophen (TYLENOL) 500 MG tablet Take 1,000 mg by mouth every 6 (six) hours as needed for mild pain, fever or headache.    [provider]  hydrOXYzine (VISTARIL) 25 MG capsule Take 1 capsule (25 mg total) by mouth 3 (three) times daily. 05/16/21     omeprazole (PRILOSEC) 40 MG capsule Take 1 capsule (40 mg total) by mouth in the morning and at bedtime. Patient taking differently: Take 40 mg by mouth daily. 12/21/20   Levin Erp, PA  sertraline (ZOLOFT) 50 MG tablet Take 1 tablet (50 mg total) by mouth daily. 05/22/21 05/22/22  Derrill Center, NP    Allergies    Patient has no known allergies.  Review of Systems   Review of Systems  Constitutional:  Negative for appetite change and fatigue.  HENT:  Negative for congestion, ear discharge and sinus pressure.        Neck pain  Eyes:  Negative for discharge.  Respiratory:  Negative for cough.   Cardiovascular:  Negative for chest pain.  Gastrointestinal:  Positive for abdominal pain.  Negative for diarrhea.  Genitourinary:  Negative for frequency and hematuria.  Musculoskeletal:  Positive for neck pain. Negative for back pain.  Skin:  Negative for rash.  Neurological:  Negative for seizures and headaches.  Psychiatric/Behavioral:  Negative for hallucinations.    Physical Exam Updated Vital Signs BP 112/76   Pulse 77   Temp 98.7 F (37.1 C) (Oral)   Resp 18   LMP 07/22/2021 (Exact Date)   SpO2 100%   Physical Exam Vitals and nursing note reviewed.  Constitutional:      Appearance: She is well-developed.  HENT:     Head: Normocephalic.     Nose: Nose normal.  Eyes:     General: No scleral icterus.    Conjunctiva/sclera: Conjunctivae normal.  Neck:     Thyroid: No thyromegaly.  Cardiovascular:     Rate and Rhythm: Normal rate and regular rhythm.     Heart sounds: No murmur heard.   No friction rub. No gallop.  Pulmonary:     Breath sounds: No stridor. No wheezing or rales.  Chest:     Chest wall: No tenderness.  Abdominal:     General: There is no distension.     Tenderness: There is no abdominal tenderness. There is no rebound.     Comments: Minimal epigastric tenderness  Musculoskeletal:        General: Normal range of motion.     Cervical back: Neck supple.  Lymphadenopathy:     Cervical: No cervical adenopathy.  Skin:    Findings: No erythema or rash.  Neurological:     Mental Status: She is alert and oriented to person, place, and time.     Motor: No abnormal muscle tone.     Coordination: Coordination normal.  Psychiatric:        Behavior: Behavior normal.    ED Results / Procedures / Treatments   Labs (all labs ordered are listed, but only abnormal results are displayed) Labs Reviewed  CBC WITH DIFFERENTIAL/PLATELET  COMPREHENSIVE METABOLIC PANEL    EKG None  Radiology DG ABD ACUTE 2+V W 1V CHEST  Result Date: 07/26/2021 CLINICAL DATA:  Abdominal pain EXAM: DG ABDOMEN ACUTE WITH  1 VIEW CHEST COMPARISON:  03/17/2013  FINDINGS: There is no evidence of dilated bowel loops or free intraperitoneal air. Mild amount of stool in the colon. No radiopaque calculi or other significant radiographic abnormality is seen. Heart size and mediastinal contours are within normal limits. Both lungs are clear. IMPRESSION: Negative abdominal radiographs.  No acute cardiopulmonary disease. Electronically Signed   By: Franchot Gallo M.D.   On: 07/26/2021 10:19    Procedures Procedures   Medications Ordered in ED Medications  ketorolac (TORADOL) 30 MG/ML injection 60 mg (60 mg Intramuscular Given 07/26/21 6415)    ED Course  I have reviewed the triage vital signs and the nursing notes.  Pertinent labs & imaging results that were available during my care of the patient were reviewed by me and considered in my medical decision making (see chart for details).    MDM Rules/Calculators/A&P                           Labs unremarkable.  X-ray negative.  Patient will increase her Prilosec to twice a day for her epigastric discomfort and follow-up with her PCP.  Also for her neck discomfort and swollen lymph nodes occasionally.  She has been seen 3 times in the emergency department for this and she will be referred to ENT for evaluation of her lymph node Final Clinical Impression(s) / ED Diagnoses Final diagnoses:  Neck pain  PUD (peptic ulcer disease)    Rx / DC Orders ED Discharge Orders     None        Milton Ferguson, MD 07/26/21 1046

## 2021-07-26 NOTE — ED Triage Notes (Signed)
Pt arrived via POV, c/o lymph node swelling x2 years, has been flaring up intermittently causing swelling and feeling for the past two years. Requesting lymph node specialist.

## 2021-07-29 DIAGNOSIS — R590 Localized enlarged lymph nodes: Secondary | ICD-10-CM | POA: Insufficient documentation

## 2021-07-30 ENCOUNTER — Other Ambulatory Visit: Payer: Self-pay | Admitting: Otolaryngology

## 2021-07-30 DIAGNOSIS — R590 Localized enlarged lymph nodes: Secondary | ICD-10-CM

## 2021-08-13 NOTE — Telephone Encounter (Signed)
Yes, plan for quadruple therapy.  Please confirm no allergies to the following regimen.  1) Omeprazole 20 mg 2 times a day x 14 d 2) Pepto Bismol 2 tabs (262 mg each) 4 times a day x 14 d 3) Metronidazole 250 mg 4 times a day x 14 d 4) doxycycline 100 mg 2 times a day x 14 d  After 14 days, ok to stop omeprazole.  4 weeks after treatment completed, check H. Pylori stool antigen to confirm eradication (must be off acid suppression therapy)

## 2021-08-19 ENCOUNTER — Telehealth: Payer: Self-pay | Admitting: General Surgery

## 2021-08-19 ENCOUNTER — Other Ambulatory Visit: Payer: Self-pay

## 2021-08-19 DIAGNOSIS — A048 Other specified bacterial intestinal infections: Secondary | ICD-10-CM

## 2021-08-19 MED ORDER — BISMUTH SUBSALICYLATE 262 MG PO CHEW
524.0000 mg | CHEWABLE_TABLET | Freq: Four times a day (QID) | ORAL | 0 refills | Status: AC
  Filled 2021-08-19 (×3): qty 112, 14d supply, fill #0

## 2021-08-19 MED ORDER — DOXYCYCLINE HYCLATE 100 MG PO TABS
100.0000 mg | ORAL_TABLET | Freq: Two times a day (BID) | ORAL | 0 refills | Status: AC
  Filled 2021-08-19: qty 28, 14d supply, fill #0

## 2021-08-19 MED ORDER — METRONIDAZOLE 250 MG PO TABS
250.0000 mg | ORAL_TABLET | Freq: Four times a day (QID) | ORAL | 0 refills | Status: AC
  Filled 2021-08-19: qty 56, 14d supply, fill #0

## 2021-08-19 MED ORDER — OMEPRAZOLE 20 MG PO CPDR
20.0000 mg | DELAYED_RELEASE_CAPSULE | Freq: Two times a day (BID) | ORAL | 0 refills | Status: DC
  Filled 2021-08-19: qty 28, 14d supply, fill #0

## 2021-08-19 NOTE — Telephone Encounter (Signed)
Quad therapy and order for stool test ordered

## 2021-08-19 NOTE — Telephone Encounter (Signed)
Patient called requesting to speak with you to go over the medication instructions.

## 2021-08-20 ENCOUNTER — Other Ambulatory Visit: Payer: Self-pay

## 2021-08-21 NOTE — Telephone Encounter (Signed)
Went over directions of quad therapy and the stool antigen testing time frame. The patient stated she is still having LUQ pain and scheduled a follow up appointment for 10/01/2021.

## 2021-08-28 ENCOUNTER — Other Ambulatory Visit: Payer: Self-pay

## 2021-08-28 ENCOUNTER — Ambulatory Visit
Admission: RE | Admit: 2021-08-28 | Discharge: 2021-08-28 | Disposition: A | Discharge status: Home / Self Care | Payer: 59 | Source: Ambulatory Visit | Attending: Otolaryngology | Admitting: Otolaryngology

## 2021-08-28 DIAGNOSIS — R590 Localized enlarged lymph nodes: Secondary | ICD-10-CM

## 2021-08-28 MED ORDER — IOPAMIDOL (ISOVUE-300) INJECTION 61%
75.0000 mL | Freq: Once | INTRAVENOUS | Status: AC | PRN
  Administered 2021-08-28: 75 mL via INTRAVENOUS

## 2021-09-06 ENCOUNTER — Telehealth (HOSPITAL_COMMUNITY): Payer: Self-pay | Admitting: Psychiatry

## 2021-09-09 ENCOUNTER — Encounter (HOSPITAL_COMMUNITY): Payer: Self-pay | Admitting: Psychiatry

## 2021-09-09 ENCOUNTER — Other Ambulatory Visit: Payer: Self-pay

## 2021-09-09 ENCOUNTER — Other Ambulatory Visit (HOSPITAL_COMMUNITY): Payer: 59 | Attending: Psychiatry | Admitting: Psychiatry

## 2021-09-09 DIAGNOSIS — Z6379 Other stressful life events affecting family and household: Secondary | ICD-10-CM | POA: Insufficient documentation

## 2021-09-09 DIAGNOSIS — Z818 Family history of other mental and behavioral disorders: Secondary | ICD-10-CM | POA: Diagnosis not present

## 2021-09-09 DIAGNOSIS — Z79899 Other long term (current) drug therapy: Secondary | ICD-10-CM | POA: Insufficient documentation

## 2021-09-09 DIAGNOSIS — F4310 Post-traumatic stress disorder, unspecified: Secondary | ICD-10-CM | POA: Insufficient documentation

## 2021-09-09 DIAGNOSIS — F1010 Alcohol abuse, uncomplicated: Secondary | ICD-10-CM | POA: Diagnosis not present

## 2021-09-09 DIAGNOSIS — F332 Major depressive disorder, recurrent severe without psychotic features: Secondary | ICD-10-CM | POA: Diagnosis not present

## 2021-09-09 DIAGNOSIS — R454 Irritability and anger: Secondary | ICD-10-CM | POA: Diagnosis not present

## 2021-09-09 DIAGNOSIS — F419 Anxiety disorder, unspecified: Secondary | ICD-10-CM | POA: Insufficient documentation

## 2021-09-09 DIAGNOSIS — F1210 Cannabis abuse, uncomplicated: Secondary | ICD-10-CM | POA: Insufficient documentation
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
## 2021-09-09 NOTE — Progress Notes (Signed)
Virtual Visit via Video Note   I connected with Joan Knight on 09/09/21 at  9:00 AM EDT by a video enabled telemedicine application and verified that I am speaking with the correct person using two identifiers.   At orientation to the IOP program, Case Manager discussed the limitations of evaluation and management by telemedicine and the availability of in person appointments. The patient expressed understanding and agreed to proceed with virtual visits throughout the duration of the program.   Location:  Patient: Patient Home Provider: [facility] [place] [facility]   History of Present Illness: MDD   Observations/Objective: Check In: Case Manager checked in with all participants to review discharge dates, insurance authorizations, work-related documents and needs from the treatment team regarding medications. Joan Knight stated needs and engaged in discussion.    Initial Therapeutic Activity: Counselor facilitated a check-in with Joan Knight to assess for safety, sobriety and medication compliance.  Counselor also inquired about Joan Knight's current emotional ratings, as well as any significant changes in thoughts, feelings or behavior since previous check in.  Joan Knight presented for session on time and was alert, oriented x5, with no evidence or self-report of active SI/HI or A/V H.  Joan Knight reported compliance with medication and denied use of alcohol or illicit substances.  Joan Knight reported scores of 10/10 for depression, 10/10 for anxiety, and 7/10 for irritability.  Joan Knight denied any recent outburst, but reported that she did have a panic attack Thursday when learning of a family member being admitted into the hospital.  Joan Knight reported that this is one of many stressors which led her to seek group therapy again, including grieving a miscarriage, helping her elderly parents, and managing a difficult job.  Joan Knight reported that she has been using alcohol and marijuana to cope with some of these stressors, smoking  marijuana once per day, as well as drinking alcohol 2-3x per week.  Counselor encouraged Joan Knight to avoid reliance upon these substances to cope due to additional consequences that could result, including dependence.     Second Therapeutic Activity: Counselor provided psychoeducation on subject of boundaries with group members today using a virtual handout.  This handout defined boundaries as the limits and rules that we set for ourselves within relationships, and featured a breakdown of the 3 common categories of boundaries (i.e. porous, rigid, and healthy), along with typical traits specific to each one for easy identification.  It was noted that most people have a mixture of different boundary types depending on setting, person, and culture.  Additional information was provided on the types of boundaries (i.e. physical, intellectual, emotional, sexual, material, and time) within relationships, and what could be considered healthy versus unhealthy. Counselor tasked members with identifying what types of boundaries they presently hold within her own support systems, the collective impact these boundaries have upon their mental health, and changes that could be made in order to more effectively communicate individual mental health needs.  Intervention was effective, as evidenced by Joan Knight actively participating in discussion on the subject, reporting that she primarily has rigid boundary traits, including maintaining few close relationships, being protective of personal information, seeming detached at times, and keeping others at a distance to avoid rejection.  Joan Knight reported that the only porous trait she has is overinvolvement in certain supports lives, which is supported by her putting family needs above her own due to religious upbringing.  Joan Knight reported that a major factor influencing her rigid boundaries is trust broken by others in the past.  Joan Knight reported that her  goal is to figure out how to  share with supports appropriately in order to strengthen support network during treatment, and find a balance in how much time she can commit to others, stating "I'm trying to learn how to take care of myself first so I can take care of them later".     Assessment and Plan: Counselor recommends that Joan Knight remain in IOP treatment to better manage mental health symptoms, ensure stability and pursue completion of treatment plan goals. Counselor recommends adherence to crisis/safety plan, taking medications as prescribed, and following up with medical professionals if any issues arise.   Follow Up Instructions: Counselor will send Webex link for next session. Joan Knight was advised to call back or seek an in-person evaluation if the symptoms worsen or if the condition fails to improve as anticipated.   I provided 180 minutes of non-face-to-face time during this encounter.   Shade Flood, LCSW, LCAS 09/09/21

## 2021-09-10 ENCOUNTER — Other Ambulatory Visit: Payer: Self-pay

## 2021-09-10 ENCOUNTER — Other Ambulatory Visit (HOSPITAL_COMMUNITY): Payer: 59 | Admitting: Licensed Clinical Social Worker

## 2021-09-10 DIAGNOSIS — F332 Major depressive disorder, recurrent severe without psychotic features: Secondary | ICD-10-CM | POA: Diagnosis not present

## 2021-09-10 NOTE — Progress Notes (Signed)
Virtual Visit via Video Note   I connected with Joan Knight on 09/10/21 at  9:00 AM EDT by a video enabled telemedicine application and verified that I am speaking with the correct person using two identifiers.   At orientation to the IOP program, Case Manager discussed the limitations of evaluation and management by telemedicine and the availability of in person appointments. The patient expressed understanding and agreed to proceed with virtual visits throughout the duration of the program.   Location:  Patient: Patient Home Provider: [facility]   History of Present Illness: MDD   Observations/Objective: Check In: Case Manager checked in with all participants to review discharge dates, insurance authorizations, work-related documents and needs from the treatment team regarding medications. Joan Knight stated needs and engaged in discussion.    Initial Therapeutic Activity: Counselor facilitated a check-in with Joan Knight to assess for safety, sobriety and medication compliance.  Counselor also inquired about Joan Knight's current emotional ratings, as well as any significant changes in thoughts, feelings or behavior since previous check in.  Joan Knight presented for session on time and was alert, oriented x5, with no evidence or self-report of active SI/HI or A/V H.  Joan Knight reported compliance with medication and denied use of alcohol or illicit substances.  Joan Knight reported scores of 9/10 for depression, 9/10 for anxiety, and 0/10 for anger/irritability.  Joan Knight denied any recent outburst or panic attacks.  Joan Knight reported that a recent success was getting some food yesterday with family and watching TV to relax.  Joan Knight reported that her goal today is to continue relaxing and limiting stress so that she can focus on improving her mental health.      Second Therapeutic Activity: Counselor introduced Joan Knight, [place] Chaplain to provide psychoeducation on topic of Grief and Loss with  members today.  Joan Knight began discussion by checking in with the group about their baseline mood today, general thoughts on what grief means to them and how it has affected them personally in the past.  Joan Knight provided information on how the process of grief/loss can differ depending upon one's unique culture, and categories of loss one could experience (i.e. loss of a person, animal, relationship, job, identity, etc).  Joan Knight encouraged members to be mindful of how pervasive loss can be, and how to recognize signs which could indicate that this is having an impact on one's overall mental health and wellbeing.  Intervention was effective, as evidenced by Joan Knight participating in discussion with speaker on the subject, reporting that she has been grieving the loss of her child from a miscarriage for months, and will occasionally have difficult feelings triggered without warning, which can have a significant impact upon her mental health.  Joan Knight reported that when she received a phone call from a breastfeeding specialist recently that "reopened the wound".  Joan Knight reported that it will be helpful to continue discussing the topic of grief during therapy, stating "I've experienced a lot of loss over my life".    Third Therapeutic Activity: Counselor engaged the group in discussion on managing work/life balance today to improve mental health and wellness.  Counselor explained how finding balance between responsibilities at home and work place can be challenging, lead to increased stress, and this has been further complicated by recent pandemic leading to unemployment, more virtual work, and blurring of lines between home as a place of rest or work duties.  Counselor facilitated discussion on what challenges members have faced with this issue historically, as well as what, if  any, issues have arisen following pandemic. Counselor also discussed strategies for improving work/life balance while members work on their  mental health during treatment.  Some of these included keeping track of time management; creating a list of priorities and scaling importance; setting realistic, measurable goals each day; establishing boundaries; taking care of health needs; and nurturing relationships at home and work for support.  Counselor inquired about areas where members feel they are excelling, as well as areas they could focus on during treatment.  Intervention was effective, as evidenced by Joan Knight engaging in discussion on topic, reporting that her work/life balance has been off for some time now, as she has been working at her job 6 days per week with little time for her self-care.  Joan Knight reported that she considers herself to be 'time poor', as Sundays offer no rest time, and are often spent handling household tasks.  Joan Knight reported that she has experienced numerous signs of burnout that contributed to need to enter group for treatment, including feeling tired/drained, low appetite, lack of motivation, feelings overwhelmed, and isolating in her room when she got home from work.  Joan Knight reported that she recently got a new job she is expected to begin in January, and looks forward to this, as she plans to begin a time log to track balance of work and self-care activities, make appointments with her PCP more often to ensure optimal physical health, start leaving work at work to establish a boundary, and become more assertive so she can decline unreasonable requests from management.  Joan Knight stated "This was good to talk about.  I don't want to come in to my new job and make the same mistakes.  It showed me I was burnt out and I didn't have any explanation for it".     Assessment and Plan: Counselor recommends that Joan Knight remain in IOP treatment to better manage mental health symptoms, ensure stability and pursue completion of treatment plan goals. Counselor recommends adherence to crisis/safety plan, taking medications as  prescribed, and following up with medical professionals if any issues arise.   Follow Up Instructions: Counselor will send Webex link for next session. Diasha was advised to call back or seek an in-person evaluation if the symptoms worsen or if the condition fails to improve as anticipated.   I provided 170 minutes of non-face-to-face time during this encounter.   Shade Flood, LCSW, LCAS 09/10/21

## 2021-09-11 ENCOUNTER — Other Ambulatory Visit: Payer: Self-pay

## 2021-09-11 ENCOUNTER — Other Ambulatory Visit (HOSPITAL_COMMUNITY): Payer: 59 | Admitting: Licensed Clinical Social Worker

## 2021-09-11 DIAGNOSIS — F332 Major depressive disorder, recurrent severe without psychotic features: Secondary | ICD-10-CM | POA: Diagnosis not present
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
## 2021-09-11 NOTE — Progress Notes (Signed)
Virtual Visit via Video Note   I connected with Joan Knight on 09/11/21 at  9:00 AM EDT by a video enabled telemedicine application and verified that I am speaking with the correct person using two identifiers.   At orientation to the IOP program, Case Manager discussed the limitations of evaluation and management by telemedicine and the availability of in person appointments. The patient expressed understanding and agreed to proceed with virtual visits throughout the duration of the program.   Location:  Patient: Patient Home Provider: OPT [place] Office   History of Present Illness: MDD   Observations/Objective: Check In: Case Manager checked in with all participants to review discharge dates, insurance authorizations, work-related documents and needs from the treatment team regarding medications. Joan Knight stated needs and engaged in discussion.    Initial Therapeutic Activity: Counselor facilitated a check-in with Joan Knight to assess for safety, sobriety and medication compliance.  Counselor also inquired about Joan Knight's current emotional ratings, as well as any significant changes in thoughts, feelings or behavior since previous check in.  Joan Knight presented for session on time and was alert, oriented x5, with no evidence or self-report of active SI/HI or A/V H.  Joan Knight reported compliance with medication and denied use of alcohol or illicit substances.  Joan Knight reported scores of 8/10 for depression, 8/10 for anxiety, and 0/10 for anger/irritability.  Joan Knight denied any recent outbursts or panic attacks.  Joan Knight reported that a recent success was having the motivation to cook a nice meal for family yesterday.  Joan Knight denied any recent struggles.  Joan Knight reported that her goal today is to take care of some laundry to feel productive and avoid allowing it to accumulate further.     Second Therapeutic Activity: Counselor introduced topic of stress management today.  Counselor provided definition  of stress as feeling tense, overwhelmed, worn out, and/or exhausted, and noted that in small amounts, stress can be motivating until things become too overwhelming to manage.  Counselor also explained how stress can be acute (brief but intense) or chronic (long-lasting) and this can impact the severity of symptoms one can experience in the physical, emotional, and behavioral categories.  Counselor inquired about members' specific stressors, how long they have been prevalent, and the various symptoms that tend to manifest as a result.  Counselor also offered several stress management strategies to help improve members' coping ability, including journaling, gratitude practice, relaxation techniques, and time management tips.  Counselor also explained that research has shown a strong support network composed of trusted family, friends, or community members can increase resilience in times of stress, and inquired about who members can reach out to for help in managing stressors.  Counselor encouraged members to consider discussing stressor 'red flags' with their close supports that can be monitored and strategies for assisting them in times of crisis. Intervention was effective, as evidenced by Joan Knight actively engaging in discussion on topic, reporting that she has tried to [employer] stress down" for several years and avoid talking about it with anyone, but this has resulted in appearance of physical health problems such as acid reflux, and daily migraines/headaches.  Joan Knight was also able to identify several stressors affecting current mental health, such as job transitions, family disagreements, and feelings of loneliness and fatigue.  Joan Knight reported that her stress management goal is to be more open with positive supports for assistance when faced with stressors, and utilize healthier coping skills to avoid "Sweating the small stuff".  Joan Knight expressed interest in using skills such as  deep breathing, compassion  meditation, and scheduling for pleasant activities during upcoming week such as going out to eat with some friends and dance afterward.    Assessment and Plan: Counselor recommends that Joan Knight remain in IOP treatment to better manage mental health symptoms, ensure stability and pursue completion of treatment plan goals. Counselor recommends adherence to crisis/safety plan, taking medications as prescribed, and following up with medical professionals if any issues arise.   Follow Up Instructions: Counselor will send Webex link for next session. Joan Knight was advised to call back or seek an in-person evaluation if the symptoms worsen or if the condition fails to improve as anticipated.   I provided 180 minutes of non-face-to-face time during this encounter.   Shade Flood, LCSW, LCAS 09/11/21

## 2021-09-12 ENCOUNTER — Other Ambulatory Visit (HOSPITAL_COMMUNITY): Payer: 59 | Admitting: Licensed Clinical Social Worker

## 2021-09-12 DIAGNOSIS — F332 Major depressive disorder, recurrent severe without psychotic features: Secondary | ICD-10-CM | POA: Diagnosis not present

## 2021-09-12 NOTE — Progress Notes (Signed)
Virtual Visit via Video Note   I connected with Joan Knight on 09/12/21 at  9:00 AM EDT by a video enabled telemedicine application and verified that I am speaking with the correct person using two identifiers.   At orientation to the IOP program, Case Manager discussed the limitations of evaluation and management by telemedicine and the availability of in person appointments. The patient expressed understanding and agreed to proceed with virtual visits throughout the duration of the program.   Location:  Patient: Patient Home Provider: [facility] [place] Office   History of Present Illness: MDD   Observations/Objective: Check In: Case Manager checked in with all participants to review discharge dates, insurance authorizations, work-related documents and needs from the treatment team regarding medications. Joan Knight stated needs and engaged in discussion.    Initial Therapeutic Activity: Counselor facilitated a check-in with Joan Knight to assess for safety, sobriety and medication compliance.  Counselor also inquired about Joan Knight's current emotional ratings, as well as any significant changes in thoughts, feelings or behavior since previous check in.  Joan Knight presented for session on time and was alert, oriented x5, with no evidence or self-report of active SI/HI or A/V H.  Joan Knight reported compliance with medication and denied use of alcohol or illicit substances.  Joan Knight reported scores of 10/10 for depression, 10/10 for anxiety, and 0/10 for anger/irritability.  Joan Knight denied any recent outbursts or panic attacks.   Joan Knight reported that a recent success has been continuing to take time to relax while out of work.  Joan Knight denied any present struggles.      Second Therapeutic Activity: Counselor covered topic of core beliefs with group today.  Counselor virtually shared a handout on the subject, which explained how everyone looks at the world differently, and two people can have the same experience, but  have different interpretations of what happened.  Members were encouraged to think of these like sunglasses with different "shades" influencing perception towards positive or negative outcomes.  Examples of negative core beliefs were provided, such as "I'm unlovable", "I'm not good enough", and "I'm a bad person".  Members were asked to share which one(s) they could relate to, and then identify evidence which contradicts these beliefs.  Counselor also provided psychoeducation on positive affirmations today.  Counselor explained how these are positive statements which can be spoken out loud or recited mentally to challenge negative thoughts and/or core beliefs to improve mood and outlook each day.  Counselor shared a comprehensive list of affirmations virtually to members with different categories, including ones for health, confidence, success, and happiness.  Counselor invited members to look through this list and identify any which resonated with them, and practice saying them out loud with sincerity.  Intervention was effective, as evidenced by Joan Knight actively engaging in discussion on topic, reporting that her family and past romantic relationships likely had an influence upon her core beliefs, and some of the negative ones have included "I am not helpful" and "I am unlovable".  Joan Knight reported that this led to several consequences over time, such as trouble trusting others, feelings of inadequacy, and substance use.  Joan Knight was able to identify sufficient evidence to challenge the core belief "I am not helpful", including her ability to assist parents with caretaking, juggling responsibilities, and not receiving any negative feedback from supports for her efforts.  Joan Knight also expressed receptiveness to including positive affirmations in daily practice, including examples such as "I am confident about solving life's problems successfully", "My future is good and filled  with happiness", and "I follow my  dreams no matter what".  Joan Knight stated "I think this will be really helpful for pushing those negative thoughts back during the day".    Assessment and Plan: Counselor recommends that Joan Knight remain in IOP treatment to better manage mental health symptoms, ensure stability and pursue completion of treatment plan goals. Counselor recommends adherence to crisis/safety plan, taking medications as prescribed, and following up with medical professionals if any issues arise.   Follow Up Instructions: Counselor will send Webex link for next session. Joan Knight was advised to call back or seek an in-person evaluation if the symptoms worsen or if the condition fails to improve as anticipated.   I provided 165 minutes of non-face-to-face time during this encounter.   Shade Flood, LCSW, LCAS 09/12/21

## 2021-09-13 ENCOUNTER — Ambulatory Visit (HOSPITAL_COMMUNITY): Payer: 59

## 2021-09-16 ENCOUNTER — Other Ambulatory Visit: Payer: Self-pay

## 2021-09-16 ENCOUNTER — Other Ambulatory Visit (HOSPITAL_COMMUNITY): Payer: 59 | Admitting: Psychiatry

## 2021-09-16 DIAGNOSIS — F332 Major depressive disorder, recurrent severe without psychotic features: Secondary | ICD-10-CM

## 2021-09-16 NOTE — Progress Notes (Signed)
Virtual Visit via Video Note   I connected with [name] on 09/16/21 at  9:00 AM EDT by a video enabled telemedicine application and verified that I am speaking with the correct person using two identifiers.   At orientation to the IOP program, Case Manager discussed the limitations of evaluation and management by telemedicine and the availability of in person appointments. The patient expressed understanding and agreed to proceed with virtual visits throughout the duration of the program.   Location:  Patient: Patient Home Provider: [facility] [place] [facility]   History of Present Illness: MDD   Observations/Objective: Check In: Case Manager checked in with all participants to review discharge dates, insurance authorizations, work-related documents and needs from the treatment team regarding medications. Clementine stated needs and engaged in discussion.    Initial Therapeutic Activity: Counselor facilitated a check-in with Tanisia to assess for safety, sobriety and medication compliance.  Counselor also inquired about Loise's current emotional ratings, as well as any significant changes in thoughts, feelings or behavior since previous check in.  Tashera presented for session on time and was alert, oriented x5, with no evidence or self-report of active SI/HI or A/V H.  Barbaraann reported compliance with medication.  Camden reported that she did socially use marijuana and alcohol over the weekend.  Reese reported scores of 8/10 for depression, 8/10 for anxiety, and 0/10 for anger/irritability.  Krystan denied any recent outbursts or panic attacks.  Stepahnie reported that a recent success was going out over the weekend with friends and dancing, which was a good stress outlet.  Rivers reported that a recent struggle was experiencing a crying spell last night following a disagreement with a friend, as well as receiving mail regarding childcare that triggered memories of her miscarriage.  Counselor offered  suggestions on adjusting boundaries with support system and taking steps in order to mitigate miscarriage triggers.      Second Therapeutic Activity: Counselor introduced topic of self-care today.  Counselor explained how this can be defined as the things one does to maintain good health and improve well-being.  Counselor provided members with a self-care assessment form to complete.  This handout featured various sub-categories of self-care, including physical, psychological/emotional, social, spiritual, and professional.  Members were asked to rank their engagement in the activities listed for each dimension on a scale of 1-3, with 1 indicating 'Poor', 2 indicating '[place]', and 3 indicating 'Well'.  Counselor invited members to share results of their assessment, and inquired about which areas of self-care they are doing well in, as well as areas that require attention, and how they plan to begin addressing this during treatment.  Intervention was effective, as evidenced by [employer] participating in assessment, reporting that she is doing well in areas such as personal hygiene, wearing clothes that make her feel good, eating regularly, participating in fun activities, resting when sick, taking vacations or daytrips, finding reasons to laugh, recognizing personal strengths and achievements, expressing feelings in a healthy way, and participating in hobbies like dancing or coaching, but would benefit from focusing on additional areas such as getting enough sleep at night, exercising regularly, eating healthy foods, attending preventative medical appointments, taking time off work, getting away from distractions, talking about her problems more openly.  Maecy reported that she would plan to improve upon these self-care deficits by implementing sleep hygiene techniques such as avoiding use of electronics close to bedtime, cutting back on unhealthy food choices with lots of sugar or caffeine, taking time while off work  to set up and attend appointments with her dentist and gastroenterologist, locate a therapist to continue mental health treatment, and set a walking goal each day of 5000 steps minimum to improve exercise.    Third Therapeutic Activity: Counselor provided demonstration of relaxation technique known as mindful breathing to help members increase sense of calm, resiliency, and control.  Counselor guided members through process of getting comfortable, achieving a relaxed breathing rhythm, and focusing on this for several minutes, allowing troubling thoughts and feelings to come and go without rumination.  Counselor processed effectiveness of activity afterward in discussion with members, including how this impacted their mental state, whether it was difficult to stay focused, and if they plan to include it in self-care routine to improve day-to-day coping.  Intervention was effective, as evidenced by [facility] participating in exercise successfully and reporting that it was so relaxing that she almost fell asleep until her phone started going off and distracted her.  Christien reported that she would consider practicing this as an additional coping skill to handle stress.   Assessment and Plan: Counselor recommends that Bryan remain in IOP treatment to better manage mental health symptoms, ensure stability and pursue completion of treatment plan goals. Counselor recommends adherence to crisis/safety plan, taking medications as prescribed, and following up with medical professionals if any issues arise.   Follow Up Instructions: Counselor will send Webex link for next session. Arizbeth was advised to call back or seek an in-person evaluation if the symptoms worsen or if the condition fails to improve as anticipated.   I provided 180 minutes of non-face-to-face time during this encounter.   Shade Flood, [place], LCAS 09/16/21

## 2021-09-17 ENCOUNTER — Other Ambulatory Visit: Payer: Self-pay

## 2021-09-17 ENCOUNTER — Other Ambulatory Visit (HOSPITAL_COMMUNITY): Payer: 59 | Admitting: Psychiatry

## 2021-09-17 DIAGNOSIS — F332 Major depressive disorder, recurrent severe without psychotic features: Secondary | ICD-10-CM | POA: Diagnosis not present

## 2021-09-17 NOTE — Progress Notes (Signed)
Virtual Visit via Video Note   I connected with [name] on 09/17/21 at  9:00 AM EDT by a video enabled telemedicine application and verified that I am speaking with the correct person using two identifiers.   At orientation to the IOP program, Case Manager discussed the limitations of evaluation and management by telemedicine and the availability of in person appointments. The patient expressed understanding and agreed to proceed with virtual visits throughout the duration of the program.   Location:  Patient: Patient Home Provider: [facility] [place] [facility]   History of Present Illness: MDD   Observations/Objective: Check In: Case Manager checked in with all participants to review discharge dates, insurance authorizations, work-related documents and needs from the treatment team regarding medications. Elijah stated needs and engaged in discussion.    Initial Therapeutic Activity: Counselor facilitated a check-in with Shundra to assess for safety, sobriety and medication compliance.  Counselor also inquired about Jenafer's current emotional ratings, as well as any significant changes in thoughts, feelings or behavior since previous check in.  Ruhama presented for session on time and was alert, oriented x5, with no evidence or self-report of active SI/HI or A/V H.  Kiyla reported compliance with medication and denied use of alcohol.  Dania reported that she did use marijuana to help her fall asleep last night and denied any adverse side effects.  Floria reported scores of 7/10 for depression, 7/10 for anxiety, and 0/10 for anger/irritability.  Charlsie denied any recent outbursts or panic attacks.  Margeaux reported that a recent success was getting excited today about making arrangements for her father's birthday so that she can make it special for him.  She denied any present struggles and noted mood improvement.     Second Therapeutic Activity: Counselor introduced topic of building social  support network today.  Counselor explained how this can be defined as having a having a group of healthy people in one's life you can talk to, spend time with, and get help from to improve both mental and physical health.  Counselor noted that some barriers can make it difficult to connect with other people, including the presence of anxiety or depression, or moving to an unfamiliar area.  Group members were asked to assess the current state of their support network, and identify ways that this could be improved.  Tips were given on how to address previously noted barriers, such as strengthening social skills, using relaxation techniques to reduce anxiety, scheduling social time each week, and/or exploring social events nearby which could increase chances of meeting new supports.  Members were also encouraged to consider getting closer to people they already know through suggestions such as outreaching someone by text, email or phone call if they haven't spoken in awhile, doing something nice for a friend/family member unexpectedly, and/or inviting someone over for a game/movie/dinner night.  Intervention was effective, as evidenced by [employer] participating in discussion on the subject, reporting that she has positive family members and friends for support in her network, but some of her social barriers include holding rigid emotional boundaries with people, stating I tend to shut down and don't really tell people when I'm going through stuff.  I think I do it to protect myself.  Janissa reported that in addition to working on adjusting boundaries, she can also have an aggressive style when dealing with interpersonal problems, so working to become more assertive could reduce future conflict, strengthen relationships and sense of support.  Sonakshi reported interest in making new  connections by attending social events in the community such as the holiday light show at the [facility], and volunteering at  her church, a shelter, or the [employer].  Third Therapeutic Activity: Counselor introduced [name], [place] Chaplain to provide psychoeducation on topic of Grief and Loss with members today.  Estill Bamberg began discussion by checking in with the group about their baseline mood today, general thoughts on what grief means to them and how it has affected them personally in the past.  Estill Bamberg provided information on how the process of grief/loss can differ depending upon one's unique culture, and categories of loss one could experience (i.e. loss of a person, animal, relationship, job, identity, etc).  Estill Bamberg encouraged members to be mindful of how pervasive loss can be, and how to recognize signs which could indicate that this is having an impact on one's overall mental health and wellbeing.  Intervention was effective, as evidenced by [employer] participating in discussion with speaker on the subject, reporting that she has been dealing with grief related to a miscarriage for months, and stated To me, grief is like a process you go through in reaction to losing someone or something close to you.  Keyonta also participated in a written exercise regarding types of losses she has experienced in her life, and reported that loss of her child also triggered a secondary loss of faith, and struggling to make sense of why a higher power would allow this difficult event to happen.    Assessment and Plan: Counselor recommends that Lulla remain in IOP treatment to better manage mental health symptoms, ensure stability and pursue completion of treatment plan goals. Counselor recommends adherence to crisis/safety plan, taking medications as prescribed, and following up with medical professionals if any issues arise.   Follow Up Instructions: Counselor will send Webex link for next session. Lowell was advised to call back or seek an in-person evaluation if the symptoms worsen or if the condition fails to improve as  anticipated.   I provided 180 minutes of non-face-to-face time during this encounter.   Shade Flood, [place], LCAS 09/17/21

## 2021-09-18 ENCOUNTER — Emergency Department (HOSPITAL_COMMUNITY)
Admission: EM | Admit: 2021-09-18 | Discharge: 2021-09-18 | Disposition: A | Discharge status: Home / Self Care | Payer: 59 | Attending: Emergency Medicine | Admitting: Emergency Medicine

## 2021-09-18 ENCOUNTER — Telehealth (HOSPITAL_COMMUNITY): Payer: Self-pay | Admitting: Psychiatry

## 2021-09-18 ENCOUNTER — Encounter (HOSPITAL_COMMUNITY): Payer: Self-pay

## 2021-09-18 ENCOUNTER — Ambulatory Visit (HOSPITAL_COMMUNITY): Payer: 59

## 2021-09-18 DIAGNOSIS — K219 Gastro-esophageal reflux disease without esophagitis: Secondary | ICD-10-CM | POA: Diagnosis not present

## 2021-09-18 DIAGNOSIS — N9489 Other specified conditions associated with female genital organs and menstrual cycle: Secondary | ICD-10-CM | POA: Insufficient documentation

## 2021-09-18 DIAGNOSIS — R1013 Epigastric pain: Secondary | ICD-10-CM | POA: Diagnosis not present

## 2021-09-18 DIAGNOSIS — R1012 Left upper quadrant pain: Secondary | ICD-10-CM | POA: Diagnosis not present

## 2021-09-18 DIAGNOSIS — R109 Unspecified abdominal pain: Secondary | ICD-10-CM | POA: Diagnosis present

## 2021-09-18 DIAGNOSIS — F17210 Nicotine dependence, cigarettes, uncomplicated: Secondary | ICD-10-CM | POA: Insufficient documentation

## 2021-09-18 LAB — URINALYSIS, ROUTINE W REFLEX MICROSCOPIC
Bilirubin Urine: NEGATIVE
Glucose, UA: NEGATIVE mg/dL
Ketones, ur: NEGATIVE mg/dL
Leukocytes,Ua: NEGATIVE
Nitrite: NEGATIVE
Protein, ur: NEGATIVE mg/dL
RBC / HPF: >50 RBC/hpf — ABNORMAL HIGH (ref 0–5)
Specific Gravity, Urine: 1.032 — ABNORMAL HIGH (ref 1.005–1.030)
pH: 5 (ref 5.0–8.0)

## 2021-09-18 LAB — CBC WITH DIFFERENTIAL/PLATELET
Abs Immature Granulocytes: 0.02 10*3/uL (ref 0.00–0.07)
Basophils Absolute: 0 10*3/uL (ref 0.0–0.1)
Basophils Relative: 1 %
Eosinophils Absolute: 0.1 10*3/uL (ref 0.0–0.5)
Eosinophils Relative: 3 %
HCT: 37 % (ref 36.0–46.0)
Hemoglobin: 13.2 g/dL (ref 12.0–15.0)
Immature Granulocytes: 0 %
Lymphocytes Relative: 46 %
Lymphs Abs: 2.4 10*3/uL (ref 0.7–4.0)
MCH: 30.1 pg (ref 26.0–34.0)
MCHC: 35.7 g/dL (ref 30.0–36.0)
MCV: 84.5 fL (ref 80.0–100.0)
Monocytes Absolute: 0.5 10*3/uL (ref 0.1–1.0)
Monocytes Relative: 9 %
Neutro Abs: 2.1 10*3/uL (ref 1.7–7.7)
Neutrophils Relative %: 41 %
Platelets: 316 10*3/uL (ref 150–400)
RBC: 4.38 MIL/uL (ref 3.87–5.11)
RDW: 13.6 % (ref 11.5–15.5)
WBC: 5.2 10*3/uL (ref 4.0–10.5)
nRBC: 0 % (ref 0.0–0.2)

## 2021-09-18 LAB — COMPREHENSIVE METABOLIC PANEL
ALT: 13 U/L (ref 0–44)
AST: 15 U/L (ref 15–41)
Albumin: 4.2 g/dL (ref 3.5–5.0)
Alkaline Phosphatase: 42 U/L (ref 38–126)
Anion gap: 5 (ref 5–15)
BUN: 19 mg/dL (ref 6–20)
CO2: 26 mmol/L (ref 22–32)
Calcium: 9.1 mg/dL (ref 8.9–10.3)
Chloride: 106 mmol/L (ref 98–111)
Creatinine, Ser: 0.8 mg/dL (ref 0.44–1.00)
GFR, Estimated: >60 mL/min (ref >60)
Glucose, Bld: 93 mg/dL (ref 70–99)
Potassium: 4.1 mmol/L (ref 3.5–5.1)
Sodium: 137 mmol/L (ref 135–145)
Total Bilirubin: 0.6 mg/dL (ref 0.3–1.2)
Total Protein: 7.4 g/dL (ref 6.5–8.1)

## 2021-09-18 LAB — I-STAT BETA HCG BLOOD, ED (MC, WL, AP ONLY): I-stat hCG, quantitative: <5 m[IU]/mL (ref <5)

## 2021-09-18 LAB — LIPASE, BLOOD: Lipase: 32 U/L (ref 11–51)

## 2021-09-18 MED ORDER — ALUM & MAG HYDROXIDE-SIMETH 200-200-20 MG/5ML PO SUSP
30.0000 mL | Freq: Once | ORAL | Status: AC
  Administered 2021-09-18: 12:00:00 30 mL via ORAL
  Filled 2021-09-18: qty 30

## 2021-09-18 MED ORDER — LIDOCAINE VISCOUS HCL 2 % MT SOLN
15.0000 mL | Freq: Once | OROMUCOSAL | Status: AC
  Administered 2021-09-18: 12:00:00 15 mL via ORAL
  Filled 2021-09-18: qty 15

## 2021-09-18 NOTE — ED Notes (Signed)
Patient was unable to give urine sample at this time

## 2021-09-18 NOTE — ED Triage Notes (Signed)
Pt presents with c/o upper left quad abdominal pain on and off for years. Pt reports she has been seen here and at her regular doctors office for the same with no definitive diagnosis. Pt denies any N/V.

## 2021-09-18 NOTE — ED Provider Notes (Signed)
Emergency Medicine Provider Triage Evaluation Note  Joan Knight , a 35 y.o. female  was evaluated in triage.  Pt complains of abd pain.  Review of Systems  Positive: LUQ pain Negative: Fever, chills, cp, sob, n/v/d, dysuria  Physical Exam  BP 110/74 (BP Location: Left Arm)    Pulse 88    Temp 98.2 F (36.8 C) (Oral)    Resp 16    LMP 09/18/2021 (Approximate)    SpO2 100%  Gen:   Awake, no distress   Resp:  Normal effort  MSK:   Moves extremities without difficulty  Other:    Medical Decision Making  Medically screening exam initiated at 9:51 AM.  Appropriate orders placed.  [name] was informed that the remainder of the evaluation will be completed by another provider, this initial triage assessment does not replace that evaluation, and the importance of remaining in the ED until their evaluation is complete.  PT here with recurrent LUQ pain for more than a year.  Has been evaluated by multiple specialties without definitive diagnosis.     Domenic Moras, PA-C 09/18/21 [id number]    Pattricia Boss, MD 09/19/21 [phone]

## 2021-09-18 NOTE — Discharge Instructions (Addendum)
Your abdominal pain is most likely due to H. pylori infection.  Please take medication previously prescribed by your gastroenterologist and follow-up appropriately for further care.  1) Omeprazole 20 mg 2 times a day x 14 days 2) Pepto Bismol 2 tabs (262 mg each) 4 times a day x 14 days 3) Metronidazole 250 mg 4 times a day x 14 days 4) doxycycline 100 mg 2 times a day x 14 days   After 14 days, ok to stop omeprazole.   4 weeks after treatment completed, check H. Pylori stool antigen to confirm eradication (must be off acid suppression therapy)

## 2021-09-18 NOTE — ED Provider Notes (Signed)
[facility] DEPT Provider Note   CSN: [id number] Arrival date & time: 09/18/21  0935     History Chief Complaint  Patient presents with   Abdominal Pain    Joan Knight is a 35 y.o. female.  The history is provided by the patient and medical records. No language interpreter was used.  Abdominal Pain   35 year old female with hx of GERD, depression, previous DVT here with LUQ pain. Pain x 1 year, increased last night.  Woke up due to pain around 3am.  Pain usually a dull pain 7/10 usually with improvement with tylenol.  This AM it was more severe and have been persistent since.  Pain not worse with eating, laying down or sitting up. No nausea, vomiting, diarrhea.  Hx of chronic constipation with LBM 4 days ago, usually have to take suppository.  No fever, chills, cp, sob.  When she has pain, it sometimes radiates to L scapula.  In April of this year, pt was seen by GI and had endoscopy and colonoscopy and was diagnosed with H.Pylori.  however she was pregnant at that time and elected not to have treatment.  Did reach out to GI in November, and was prescribed omeprazole, doxy, metronidazole and pepto bismo.  Still have persistent pain since not taking her meds.  LMP started today.  Denies vaginal pain or vaginal discharge or vaginal bleeding.  No urinary sxs.      Past Medical History:  Diagnosis Date   Anemia    Anxiety    Depression    DVT (deep venous thrombosis) ([place])    Had history of DVT in the right lower extremity about 5 years ago.  She tells me she was on blood thinner for about a year and then she stopped taking it.     Eczema    GERD (gastroesophageal reflux disease)    Nexplanon in place 02/24/2017   Sickle cell anemia (HCC)    Sickle cell trait (HCC)    Vaginal Pap smear, abnormal    "scraped or cut off" (?LEEP) no problems since    Patient Active Problem List   Diagnosis Date Noted   Tobacco abuse 09/26/2019   DVT (deep venous  thrombosis) (HCC)    Hb-SS disease without crisis ([place]) 02/24/2017    Past Surgical History:  Procedure Laterality Date   LEEP     REMOVAL OF IMPLANON ROD  09/26/2019     OB History     Gravida  1   Para  0   Term  0   Preterm  0   AB  0   Living  0      SAB  0   IAB  0   Ectopic  0   Multiple  0   Live Births  0           Family History  Problem Relation Age of Onset   Asthma Mother    Anxiety disorder Mother    Depression Mother    Bronchitis Mother    Emphysema Mother    COPD Mother    Alcohol abuse Father    Heart disease Father        heart murmur   Hypertension Father    Deep vein thrombosis Father    Depression Father    Heart Problems Father    Diabetes Father    Breast cancer Maternal Aunt    Bone cancer Maternal Grandfather    Congenital heart  disease Maternal Grandmother    Pancreatic cancer Neg Hx    Stomach cancer Neg Hx    Esophageal cancer Neg Hx    Colon cancer Neg Hx     Social History   Tobacco Use   Smoking status: Every Day    Packs/day: 0.25    Years: 16.00    Pack years: 4.00    Types: Cigarettes   Smokeless tobacco: Never   Tobacco comments:    Cutting back, plans to quit   Vaping Use   Vaping Use: Some days   Substances: Nicotine  Substance Use Topics   Alcohol use: Not Currently    Comment: occ/social   Drug use: No    Home Medications Prior to Admission medications   Medication Sig Start Date End Date Taking? Authorizing Provider  acetaminophen (TYLENOL) 500 MG tablet Take 1,000 mg by mouth every 6 (six) hours as needed for mild pain, fever or headache.    [provider]  hydrOXYzine (VISTARIL) 25 MG capsule Take 1 capsule (25 mg total) by mouth 3 (three) times daily. 05/16/21     omeprazole (PRILOSEC) 20 MG capsule Take 1 capsule (20 mg total) by mouth 2 (two) times daily before a meal for 14 days. 08/19/21 09/04/21  Cirigliano, Vito V, DO  omeprazole (PRILOSEC) 40 MG capsule Take 1 capsule  (40 mg total) by mouth in the morning and at bedtime. Patient taking differently: Take 40 mg by mouth daily. 12/21/20   Levin Erp, PA  sertraline (ZOLOFT) 50 MG tablet Take 1 tablet (50 mg total) by mouth daily. 05/22/21 05/22/22  Derrill Center, NP    Allergies    Patient has no known allergies.  Review of Systems   Review of Systems  Gastrointestinal:  Positive for abdominal pain.  All other systems reviewed and are negative.  Physical Exam Updated Vital Signs BP 119/88    Pulse 68    Temp 98.2 F (36.8 C) (Oral)    Resp 18    LMP 09/18/2021 (Approximate)    SpO2 100%   Physical Exam Vitals and nursing note reviewed.  Constitutional:      General: She is not in acute distress.    Appearance: She is well-developed.  HENT:     Head: Atraumatic.  Eyes:     Conjunctiva/sclera: Conjunctivae normal.  Cardiovascular:     Rate and Rhythm: Normal rate and regular rhythm.  Pulmonary:     Effort: Pulmonary effort is normal.  Abdominal:     General: Bowel sounds are normal. There is no distension.     Palpations: Abdomen is soft.     Tenderness: There is abdominal tenderness in the epigastric area.  Musculoskeletal:     Cervical back: Neck supple.  Skin:    Findings: No rash.  Neurological:     Mental Status: She is alert.  Psychiatric:        Mood and Affect: Mood normal.    ED Results / Procedures / Treatments   Labs (all labs ordered are listed, but only abnormal results are displayed) Labs Reviewed  CBC WITH DIFFERENTIAL/PLATELET  COMPREHENSIVE METABOLIC PANEL  LIPASE, BLOOD  URINALYSIS, ROUTINE W REFLEX MICROSCOPIC  I-STAT BETA HCG BLOOD, ED ([facility], [facility], [facility] ONLY)    EKG None  Radiology No results found.  Procedures Procedures   Medications Ordered in ED Medications - No data to display  ED Course  I have reviewed the triage vital signs and the nursing notes.  Pertinent labs &  imaging results that were available during my care of the patient  were reviewed by me and considered in my medical decision making (see chart for details).    MDM Rules/Calculators/A&P                           BP (!) 129/94    Pulse 66    Temp 98.2 F (36.8 C) (Oral)    Resp 18    LMP 09/18/2021 (Approximate)    SpO2 100%   Final Clinical Impression(s) / ED Diagnoses Final diagnoses:  Epigastric pain    Rx / DC Orders ED Discharge Orders     None      Patient here with recurrent epigastric abdominal discomfort for a prolonged period of time.  Patient has been seen and evaluated by GI specialist and have been diagnosed with H. pylori.  She has been prescribed quadruple therapy but have not been taking the medications.  I strongly encourage patient to resume taking the medications and to follow-up with GI specialist as recommended.  Her labs are reassuring.  recommend 1) Omeprazole 20 mg 2 times a day x 14 d 2) Pepto Bismol 2 tabs (262 mg each) 4 times a day x 14 d 3) Metronidazole 250 mg 4 times a day x 14 d 4) doxycycline 100 mg 2 times a day x 14 d        Domenic Moras, PA-C 09/18/21 1236    Pattricia Boss, MD 09/19/21 1442

## 2021-09-19 ENCOUNTER — Other Ambulatory Visit: Payer: Self-pay

## 2021-09-19 ENCOUNTER — Other Ambulatory Visit (HOSPITAL_COMMUNITY): Payer: 59 | Admitting: Licensed Clinical Social Worker

## 2021-09-19 DIAGNOSIS — F332 Major depressive disorder, recurrent severe without psychotic features: Secondary | ICD-10-CM | POA: Diagnosis not present

## 2021-09-19 NOTE — Progress Notes (Signed)
Virtual Visit via Video Note   I connected with [name] on 09/19/21 at  9:00 AM EDT by a video enabled telemedicine application and verified that I am speaking with the correct person using two identifiers.   At orientation to the IOP program, Case Manager discussed the limitations of evaluation and management by telemedicine and the availability of in person appointments. The patient expressed understanding and agreed to proceed with virtual visits throughout the duration of the program.   Location:  Patient: Patient Home Provider: [facility] [place] Office   History of Present Illness: MDD   Observations/Objective: Check In: Case Manager checked in with all participants to review discharge dates, insurance authorizations, work-related documents and needs from the treatment team regarding medications. Shalisha stated needs and engaged in discussion.    Initial Therapeutic Activity: Counselor facilitated a check-in with Teaghan to assess for safety, sobriety and medication compliance.  Counselor also inquired about Dannetta's current emotional ratings, as well as any significant changes in thoughts, feelings or behavior since previous check in.  Makari presented for session on time and was alert, oriented x5, with no evidence or self-report of active SI/HI or A/V H.  Jrue reported compliance with medication and denied use of alcohol.  Dniyah reported that she did use marijuana last night to help fall asleep, but denied any negative consequences resulting from this.  Evamarie reported scores of 7/10 for depression, 7/10 for anxiety, and 3/10 for irritability.  Sherisse denied any recent outbursts or panic attacks.  Mariaisabel reported that a recent success was following through with decision to visit urgent care yesterday and feeling much better as a result today.  Pierre reported that they prescribed her some antibiotics and she will be taking these for 2 weeks.  Luvern reported that a recent struggle  has been feeling like she's coming down with a cold today, although she is taking OTC medicine to alleviate symptoms.  Katriana reported that one additional struggle was being triggered by social media memories of a friend that passed away over the last year, and ruminating upon this over the past day.  Cammy reported that a self-care goal for her today is to see a new movie in the theater with family as a healthy distraction.    Second Therapeutic Activity: Counselor introduced topic of self-esteem today and defined this as the value an individual places on oneself, based upon assessment of personal worth as a human being and approval/disapproval of one's behavior. Counselor asked members to assess their level of self-esteem at this time based upon common indicators of high self-esteem, including: accepting oneself unconditionally; having self-respect and deep seated belief that one matters; being unaffected by other people's opinions/criticisms; and showing good control over emotions.  Counselor also explained concept of one's inner critic which serves to highlight faults and minimize strengths, directly influencing low sense of self-esteem.  Counselor then provided handout on 'strengths and qualities', which featured questions to guide discussion and increase awareness of each member's unique individual abilities which could reinforce higher self-esteem. Examples of questions included: 'things I am good at', 'challenges I have overcome', and 'what I like about myself'.  Intervention was effective, as evidenced by [facility] participating in discussion on the subject, reporting that her current level of self-esteem is 10/10.  She reported that she had been struggling with low self-esteem for some time as a result of lacking meaning and sense of purpose in life, and attributed this to dissatisfaction with her stressful job.  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  Leontyne reported that she would like to continue working on improving self-esteem by practicing forgiveness more easily, as well as establishing healthier emotional boundaries with triggering supports such as her mother.  Alaysha was also able to identify several personal strengths and qualities worth noting, such as possessing kindness, and finding the positive side of difficult situations that she has faced in the past.    Assessment and Plan: Counselor recommends that [place] remain in IOP treatment to better manage mental health symptoms, ensure stability and pursue completion of treatment plan goals. Counselor recommends adherence to crisis/safety plan, taking medications as prescribed, and following up with medical professionals if any issues arise.   Follow Up Instructions: Counselor will send Webex link for next session. Niyana was advised to call back or seek an in-person evaluation if the symptoms worsen or if the condition fails to improve as anticipated.   I provided 180 minutes of non-face-to-face time during this encounter.   Shade Flood, [place], LCAS 09/19/21

## 2021-09-20 ENCOUNTER — Other Ambulatory Visit (HOSPITAL_COMMUNITY): Payer: 59 | Admitting: Licensed Clinical Social Worker

## 2021-09-20 DIAGNOSIS — F332 Major depressive disorder, recurrent severe without psychotic features: Secondary | ICD-10-CM

## 2021-09-20 NOTE — Progress Notes (Signed)
Virtual Visit via Video Note   I connected with Joan Knight on 09/20/21 at  9:00 AM EDT by a video enabled telemedicine application and verified that I am speaking with the correct person using two identifiers.   At orientation to the IOP program, Case Manager discussed the limitations of evaluation and management by telemedicine and the availability of in person appointments. The patient expressed understanding and agreed to proceed with virtual visits throughout the duration of the program.   Location:  Patient: Patient Home Provider: [facility] [place] Office   History of Present Illness: MDD   Observations/Objective: Check In: Case Manager checked in with all participants to review discharge dates, insurance authorizations, work-related documents and needs from the treatment team regarding medications. Joan Knight stated needs and engaged in discussion.    Initial Therapeutic Activity: Counselor facilitated a check-in with Joan Knight to assess for safety, sobriety and medication compliance.  Counselor also inquired about Joan Knight's current emotional ratings, as well as any significant changes in thoughts, feelings or behavior since previous check in.  Joan Knight presented for session on time and was alert, oriented x5, with no evidence or self-report of active SI/HI or A/V H.  Joan Knight reported compliance with medication and denied use of alcohol, although she admitted to ongoing use of marijuana x1 at night to help sleep.  Joan Knight reported scores of 7/10 for depression, 7/10 for anxiety, and 0/10 for anger/irritability.  Joan Knight denied any recent outbursts or panic attacks.  Joan Knight reported that a recent success was getting groceries and fixing dinner last night despite how she has been feeling.  Joan Knight reported that a current struggle is continuing to deal with cold symptoms.  Joan Knight reported that her goal will be to rest this weekend to recuperate.      Second Therapeutic Activity: Counselor introduced  topic of anger management today.  Counselor virtually shared a handout with members on this subject featuring a variety of coping skills, and facilitated discussion on these approaches.  Examples included raising awareness of anger triggers, practicing deep breathing, keeping an anger log to better understand episodes, using diversion activities to distract oneself for 30 minutes, taking a time out when necessary, and being mindful of warning signs tied to thoughts or behavior.  Counselor inquired about which techniques group members have used before, what has proved to be helpful, what their unique warning signs might be, as well as what they will try out in the future to assist with de-escalation.  Intervention was effective, as evidenced by Joan Knight participating in discussion on the subject, reporting that anger is one of many difficult emotions she has attempted to mask for several years, and warning signs that anger is rising for her include increased heartbeat, flushed skin, nausea, and feeling overheated.  Joan Knight reported that she believes compartmentalization of feelings like this have led to some physical health conditions with time, such as digestive problems, and acid reflux.  Joan Knight reported that one major stressor that influenced anger for her was her previous place of employment, and shared about the experience culminating in her making the decision to go on leave for engagement in Joan Knight.  Joan Knight expressed motivation to continue engaging in therapy following upcoming discharge from group in order to ensure a safe, judgement free space to share difficult feelings and work against bottling these up any longer, or relying excessively upon substances to numb herself of these feelings.  She reported that she would practice deep breathing and counting to 10 in order to cope with  anger triggers in the meantime.     Third Therapeutic Activity: Psycho-educational portion of group was provided by Christie Beckers, Mudlogger of community education with [employer].  Alexandra provided information on history of her local agency, mission statement, and the variety of unique services offered which group members might find beneficial to engage in, including both virtual and in-person support groups, as well as peer support program for mentoring.  Alexandra offered time to answer member's questions regarding services and encouraged them to consider utilizing these services to assist in working towards their individual wellness goals.  Intervention was effective, as evidenced by Joan Knight participating in discussion with speaker on the subject, and expressing interest in starting to attend the HER groups they have available upon discharge to increase overall support.    Assessment and Plan: Counselor recommends that Joan Knight remain in IOP treatment to better manage mental health symptoms, ensure stability and pursue completion of treatment plan goals. Counselor recommends adherence to crisis/safety plan, taking medications as prescribed, and following up with medical professionals if any issues arise.   Follow Up Instructions: Counselor will send Webex link for next session. Joan Knight was advised to call back or seek an in-person evaluation if the symptoms worsen or if the condition fails to improve as anticipated.   I provided 180 minutes of non-face-to-face time during this encounter.   Shade Flood, [place], LCAS 09/20/21

## 2021-09-23 ENCOUNTER — Other Ambulatory Visit (HOSPITAL_COMMUNITY): Payer: 59 | Admitting: Licensed Clinical Social Worker

## 2021-09-24 ENCOUNTER — Other Ambulatory Visit (HOSPITAL_COMMUNITY): Payer: 59 | Admitting: Licensed Clinical Social Worker

## 2021-09-24 ENCOUNTER — Encounter (HOSPITAL_COMMUNITY): Payer: Self-pay | Admitting: Family

## 2021-09-24 ENCOUNTER — Other Ambulatory Visit: Payer: Self-pay

## 2021-09-24 ENCOUNTER — Encounter (HOSPITAL_COMMUNITY): Payer: Self-pay | Admitting: Licensed Clinical Social Worker

## 2021-09-24 DIAGNOSIS — F332 Major depressive disorder, recurrent severe without psychotic features: Secondary | ICD-10-CM | POA: Diagnosis not present

## 2021-09-24 MED ORDER — HYDROXYZINE PAMOATE 25 MG PO CAPS
25.0000 mg | ORAL_CAPSULE | Freq: Three times a day (TID) | ORAL | 0 refills | Status: DC
  Filled 2021-09-24: qty 90, 30d supply, fill #0

## 2021-09-24 MED ORDER — SERTRALINE HCL 50 MG PO TABS
50.0000 mg | ORAL_TABLET | Freq: Every day | ORAL | 0 refills | Status: DC
  Filled 2021-09-24: qty 90, 90d supply, fill #0

## 2021-09-24 NOTE — Progress Notes (Signed)
Virtual Visit via Video Note  I connected with Joan Knight on 09/24/21 at  9:00 AM EST by a video enabled telemedicine application and verified that I am speaking with the correct person using two identifiers.  Location: Patient: Office Provider: Home   I discussed the limitations of evaluation and management by telemedicine and the availability of in person appointments. The patient expressed understanding and agreed to proceed.   I discussed the assessment and treatment plan with the patient. The patient was provided an opportunity to ask questions and all were answered. The patient agreed with the plan and demonstrated an understanding of the instructions.   The patient was advised to call back or seek an in-person evaluation if the symptoms worsen or if the condition fails to improve as anticipated.  I provided 15 minutes of non-face-to-face time during this encounter.   Derrill Center, NP   [facility] Intensive Outpatient Program Discharge Summary  Joan Knight [id number]  Admission date: 09/09/2021 Discharge date: 09/24/2021  Reason for admission: Per admission assessment:   "Joan Knight is a 35 year old that presents with a history of depression and anxiety.  Reported feeling stressed and overwhelmed after phone call by hospital nurse.  Patient had a miscarriage and the nurse called to follow-up to see how she and the baby was progressing.  She reports that started her downward spiral."   Progress in Program Toward Treatment Goals: Ongoing, patient attended and participated with daily group session with active and engaged participation.  Continues to deny suicidal or homicidal ideations.  Denies auditory visual hallucinations.  States overall her mood is improved since completing the program.  Discussed making medications available x90 days.  Patient to follow-up with primary care provider for additional refills.  Support encouragement reassurance was  provided.  Progress (rationale): Keep follow-up appointment with Sanjuana Kava therapist on January 3rd at 1500.   Take all medications as prescribed. Keep all follow-up appointments as scheduled.  Do not consume alcohol or use illegal drugs while on prescription medications. Report any adverse effects from your medications to your primary care provider promptly.  In the event of recurrent symptoms or worsening symptoms, call 911, a crisis hotline, or go to the nearest emergency department for evaluation.    Derrill Center, NP 09/24/2021

## 2021-09-24 NOTE — Progress Notes (Signed)
Virtual Visit via Video Note   I connected with [name] on 09/24/21 at  9:00 AM EDT by a video enabled telemedicine application and verified that I am speaking with the correct person using two identifiers.   At orientation to the IOP program, Case Manager discussed the limitations of evaluation and management by telemedicine and the availability of in person appointments. The patient expressed understanding and agreed to proceed with virtual visits throughout the duration of the program.   Location:  Patient: Patient Home Provider: [facility] [place] [facility]   History of Present Illness: MDD   Observations/Objective: Check In: Case Manager checked in with all participants to review discharge dates, insurance authorizations, work-related documents and needs from the treatment team regarding medications. Sharlize stated needs and engaged in discussion.    Initial Therapeutic Activity: Counselor facilitated a check-in with Leinaala to assess for safety, sobriety and medication compliance.  Counselor also inquired about Elinda's current emotional ratings, as well as any significant changes in thoughts, feelings or behavior since previous check in.  Taleia presented for session on time and was alert, oriented x5, with no evidence or self-report of active SI/HI or A/V H.  Tommye reported compliance with medication and denied use of alcohol or illicit substances.  Annastyn reported scores of 7/10 for depression, 7/10 for anxiety, and 0/10 for anger/irritability.  Tanekia denied any recent outbursts or panic attack.  Paelyn reported that a recent success was taking time to rest and recover from a cold.  Landa reported that her present struggle is coping with some anticipatory anxiety related to starting her new job.  Tishara reported that her goal is to continue doing some Christmas shopping today for the upcoming holiday.       Second Therapeutic Activity: Counselor utilized a Radio broadcast assistant with group  members today to guide discussion on topic of codependency.  This handout defined codependency as excessive emotional or psychological reliance upon someone who requires support on account of an illness or addiction.  It also explained how this issue presents in dysfunctional family systems, including behavior such as denying existence of problems, rigid boundaries on communication, strained trust, lack of individuality, and reinforcement of unhealthy coping mechanisms such as substance use.  Characteristics of co-dependent people were listed for assistance with identification, such as extreme need for approval/recognition, difficulty identifying feelings, poor communication, and more.  Members were also tasked with completing a questionnaire in order to identify signs of codependency and results were discussed afterward.  This handout also offered strategies for resolving co-dependency within one's network, including increased use of assertive communication skills in order to set appropriate boundaries.  Intervention was effective, as evidenced by [name] actively participating in discussion on the subject, and reporting that this subject was very appropriate for her, stating I can relate to all of the warning signs, especially relationship addiction.  Me and my family are very codependent and my mom will call my anytime and I've always felt like I have to be ready to help.  Skyley reported that this has influenced her anxiety, as she always anticipates being needed to help with a crisis, and regularly puts her needs to the side for others.  [name] also completed codependency questionnaire, and reported 10 positive indicators, stating The ones that stood out to me the most were having a fear of being abandoned by others, confused about where I'm going with my life, and not being able to say no to other people.  Zilphia reported that her  goal is to become more independent, and she will work to improve this by  practicing assertive communication skills and setting healthier boundaries within the family.  Mayeli stated A lot of these points resonate with me and show that I have a lot to work on.   Assessment and Plan: Rosell reported that she feels comfortable stepping down from [facility] today after successful completion, and will followup with new therapist within the month.  Counselor recommends adherence to crisis/safety plan, taking medications as prescribed, and following up with medical professionals if any issues arise.   Follow Up Instructions: Tamella was advised to call back or seek an in-person evaluation if the symptoms worsen or if the condition fails to improve as anticipated.   I provided 180 minutes of non-face-to-face time during this encounter.   Shade Flood, LCSW, LCAS 09/24/21
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
## 2021-09-24 NOTE — Patient Instructions (Signed)
D:  Patient completed MH-IOP today.  A:  Discharge today.  Follow up with Sanjuana Kava, LCSW on 10-15-21 @ 3 pm (virtual).  Patient will also follow up with PCP for medication management.  Encouraged support groups especially grief/loss through [employer]  [phone].  Return to work on 10-07-21 without any restrictions.  R:  Patient receptive.

## 2021-09-25 ENCOUNTER — Ambulatory Visit (HOSPITAL_COMMUNITY): Payer: 59

## 2021-09-26 ENCOUNTER — Ambulatory Visit (HOSPITAL_COMMUNITY): Payer: 59

## 2021-09-27 ENCOUNTER — Other Ambulatory Visit: Payer: Self-pay

## 2021-09-27 ENCOUNTER — Ambulatory Visit (HOSPITAL_COMMUNITY): Payer: 59

## 2021-10-01 ENCOUNTER — Telehealth: Payer: Self-pay | Admitting: Gastroenterology

## 2021-10-01 ENCOUNTER — Ambulatory Visit: Payer: 59 | Admitting: Gastroenterology

## 2021-10-01 NOTE — Telephone Encounter (Signed)
Patient called cancelling today's appointment as she said she was feeling some better but was questioning a return appointment in a month and possibly doing a stool sample?  Please call patient and advise.  Thank you.

## 2021-10-01 NOTE — Telephone Encounter (Signed)
Left message on machine to call back  

## 2021-10-02 NOTE — Telephone Encounter (Signed)
Called and spoke with patient. She states that she wanted to clarify the instruction for the H. Pylori stool study. Pt is aware that once she's finished with the antibiotics she will need to discontinue Omeprazole. Advised that 4 weeks after completing the antibiotics she would need to go by the lab at the [facility] office in [address] to pick up the stool kit. Pt states that she will finish the antibiotics on Friday, 10/04/21. Pt is aware that she will be due for the stool test after 1/27. Pt verbalized understanding and had no concerns at the end of the call.

## 2021-10-11 ENCOUNTER — Inpatient Hospital Stay (HOSPITAL_COMMUNITY): Admit: 2021-10-11 | Payer: Self-pay

## 2021-10-15 ENCOUNTER — Other Ambulatory Visit: Payer: Self-pay

## 2021-10-15 ENCOUNTER — Ambulatory Visit (INDEPENDENT_AMBULATORY_CARE_PROVIDER_SITE_OTHER): Payer: 59 | Admitting: Clinical

## 2021-10-15 DIAGNOSIS — F4321 Adjustment disorder with depressed mood: Secondary | ICD-10-CM

## 2021-10-15 DIAGNOSIS — F1290 Cannabis use, unspecified, uncomplicated: Secondary | ICD-10-CM

## 2021-10-15 DIAGNOSIS — Z634 Disappearance and death of family member: Secondary | ICD-10-CM

## 2021-10-15 DIAGNOSIS — F4323 Adjustment disorder with mixed anxiety and depressed mood: Secondary | ICD-10-CM

## 2021-10-15 NOTE — Plan of Care (Signed)
Pt will develop and implement healthy coping methods to manage depression and anxiety sxs as evidenced by reciting positive affirmations daily; listening to music and dancing at least 1x week; be outside in nature 1x per week for up to 30 minutes. Pt participated in completion of treatment plan.

## 2021-10-15 NOTE — Progress Notes (Signed)
Comprehensive Clinical Assessment (CCA) Note  10/15/2021 Joan Knight [id number]  Chief Complaint:  Chief Complaint  Patient presents with   Depression   Anxiety   Visit Diagnosis:  Adjustment disorder with mixed anxiety and depressed mood Grief due to loss of child Cannabis use w/out complication   CCA Screening, Triage and Referral (STR)  Patient Reported Information How did you hear about [place]? Other (Comment) Dellia Nims, MH-IOP facilitator)  Referral name: No data recorded Referral phone number: No data recorded  Whom do you see for routine medical problems? No data recorded Practice/Facility Name: No data recorded Practice/Facility Phone Number: No data recorded Name of Contact: No data recorded Contact Number: No data recorded Contact Fax Number: No data recorded Prescriber Name: No data recorded Prescriber Address (if known): No data recorded  What Is the Reason for Your Visit/Call Today? Depression and anxiety  How Long Has This Been Causing You Problems? > than 6 months  What Do You Feel Would Help You the Most Today? Treatment for Depression or other mood problem   Have You Recently Been in Any Inpatient Treatment (Hospital/Detox/Crisis Center/28-Day Program)? No (Denies any hx)  Name/Location of Program/Hospital:No data recorded How Long Were You There? No data recorded When Were You Discharged? No data recorded  Have You Ever Received Services From [facility] Before? No data recorded Who Do You See at [facility]? No data recorded  Have You Recently Had Any Thoughts About Hurting Yourself? No (2018-pt reports SI, denies any hx of attempts)  Are You Planning to Commit Suicide/Harm Yourself At This time? No   Have you Recently Had Thoughts About [place]? No  Explanation: No data recorded  Have You Used Any Alcohol or Drugs in the Past 24 Hours? Yes  How Long Ago Did You Use Drugs or Alcohol? No data recorded What Did You Use and  How Much? Marijuana, 1/2 blunt   Do You Currently Have a Therapist/Psychiatrist? No  Name of Therapist/Psychiatrist: No data recorded  Have You Been Recently Discharged From Any Office Practice or Programs? No  Explanation of Discharge From Practice/Program: No data recorded    CCA Screening Triage Referral Assessment Type of Contact: Virtual  Is this Initial or Reassessment? No data recorded Date Telepsych consult ordered in [facility]:  No data recorded Time Telepsych consult ordered in [facility]:  No data recorded  Patient Reported Information Reviewed? No data recorded Patient Left Without Being Seen? No data recorded Reason for Not Completing Assessment: No data recorded  Collateral Involvement:   Does Patient Have a [place]? No data recorded Name and Contact of Legal Guardian: No data recorded If Minor and Not Living with Parent(s), Who has Custody? No data recorded Is CPS involved or ever been involved? Never  Is APS involved or ever been involved? Never   Patient Determined To Be At Risk for Harm To Self or Others Based on Review of Patient Reported Information or Presenting Complaint? No  Method: No data recorded Availability of Means: No data recorded Intent: No data recorded Notification Required: No data recorded Additional Information for Danger to Others Potential: No data recorded Additional Comments for Danger to Others Potential: No data recorded Are There Guns or Other Weapons in Your Home? Pt reports father owns firearm Types of Guns/Weapons: No data recorded Are These Weapons Safely Secured?  No data recorded Who Could Verify You Are Able To Have These Secured: No data recorded Do You Have any Outstanding Charges, Pending Court Dates, Parole/Probation? No data recorded Contacted To Inform of Risk of Harm To Self or Others: No data recorded  Location of Assessment: [facility] [place]   Does Patient Present under  Involuntary Commitment? 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 Pt reports father has hx of alcoholism that has worsened after loss of his wife(pt stepmother)  Current Symptoms/Problems: Sadness, isolation, unmotivated, overwhelmed   Patient Reported Schizophrenia/Schizoaffective Diagnosis in Past: No   Strengths: good friend, started a new job  Preferences: No data recorded Abilities: No data recorded  Type of Services Patient Feels are Needed: Individual therapy. Wants referral for medication management   Initial Clinical Notes/Concerns: Pt reports she recently completed [facility] in December 2022 and referred for individual therapy. Per pt report, she had a miscarriage in June 2022 and shortly returned to work after the loss. Pt says she was under great deal of stress. During this time, pt reports her boyfriend "took a break" from the relationship. Additionally, pt says she is caring for her parents and has concerns about their health. Pt also reports she was in IOP in Aug-Sept 2022.  Mental Health Symptoms Depression:   Change in energy/activity; Fatigue; Irritability; Tearfulness; Sleep (too much or little)   Duration of Depressive symptoms:  Greater than two weeks   Mania:   None   Anxiety:    Fatigue; Irritability; Sleep; Restlessness; Tension; Worrying   Psychosis:   None   Duration of Psychotic symptoms: No data recorded  Trauma:   None   Obsessions:   None   Compulsions:   None   Inattention:   None   Hyperactivity/Impulsivity:   None   Oppositional/Defiant Behaviors:   None    Emotional Irregularity:   None   Other Mood/Personality Symptoms:  No data recorded   Mental Status Exam Appearance and self-care  Stature:   Average   Weight:   Overweight   Clothing:   Neat/clean   Grooming:   Normal   Cosmetic use:   None   Posture/gait:   Normal   Motor activity:   Not Remarkable   Sensorium  Attention:   Normal   Concentration:   Normal   Orientation:   X5   Recall/memory:   Normal   Affect and Mood  Affect:   Appropriate   Mood:   Irritable   Relating  Eye contact:   Normal   Facial expression:   Responsive   Attitude toward examiner:   Cooperative   Thought and Language  Speech flow:  Clear and Coherent   Thought content:   Appropriate to Mood and Circumstances   Preoccupation:   None   Hallucinations:   None   Organization:  No data recorded  [employer] of Knowledge:   Good   Intelligence:   Average   Abstraction:   Normal   Judgement:   Good   Reality Testing:   Realistic   Insight:   Good   Decision Making:   Normal   Social Functioning  Social Maturity:   Responsible; Isolates   Social Judgement:   Normal   Stress  Stressors:   Other (Comment); Financial (Parent's caretaker)   Coping Ability:   Deficient supports   Skill Deficits:   None   Supports:   Family     Religion: Religion/Spirituality Are You A Religious Person?: No  Leisure/Recreation: Leisure / Recreation Do You Have Hobbies?: No  Exercise/Diet: Exercise/Diet Do You Exercise?: No Have You Gained or Lost A Significant Amount of Weight in the Past Six Months?: No Do You Follow a Special Diet?: No Do You Have Any Trouble Sleeping?: Yes   CCA Employment/Education Employment/Work Situation: Employment / Work Situation Employment Situation: Employed Where is Patient Currently Employed?: [employer] Long has Patient Been Employed?: Started on 1/9 Patient's Job has Been Impacted by  Current Illness: No Has Patient ever Been in the [employer]?: No  Education: Education Is Patient Currently Attending School?: No Last Grade Completed: 12 Did Teacher, adult education From [employer]?: Yes Did Physicist, medical?: Yes What Type of College Degree Do you Have?: Received associate degree Did You Have An Individualized Education Program (IIEP): No Did You Have Any Difficulty At School?: No Patient's Education Has Been Impacted by Current Illness: No   CCA Family/Childhood History Family and Relationship History: Family history Marital status: Long term relationship Long term relationship, how long?: 1.5 yrs What types of issues is patient dealing with in the relationship?: Pt reports shortly after having miscarriage her bf "took a break" from the relationship Does patient have children?: No  Childhood History:  Childhood History By whom was/is the patient raised?: Both parents Description of patient's relationship with caregiver when they were a child: parents had joint custody Patient's description of current relationship with people who raised him/her: Pt is parent's caretaker and reports father having hx of alcoholism. Pt rpeorts mother has hx depression and anxiety Does patient have siblings?: Yes Number of Siblings: 1 Description of patient's current relationship with siblings: Bro age 33, has hx depresson and anxiety Did patient suffer any verbal/emotional/physical/sexual abuse as a child?: No Did patient suffer from severe childhood neglect?: No Has patient ever been sexually abused/assaulted/raped as an adolescent or adult?: No Witnessed domestic violence?: No Has patient been affected by domestic violence as an adult?: No  Child/Adolescent Assessment:     CCA Substance Use Alcohol/Drug Use: Alcohol / Drug Use Pain Medications: see MAR Prescriptions: see MAR Over the Counter: see MAR History of alcohol / drug use?: Yes Longest period of sobriety (when/how  long): 35yr after death of stepmother, start smoking again in 2022 Negative Consequences of Use:  (Pt denies) Withdrawal Symptoms:  (Pt denies) Substance #1 Name of Substance 1: Marijuana 1 - Age of First Use: 17 1 - Amount (size/oz): 1 blunt. Pt says she does 1-2 hits 1 - Frequency: Every 3 days 1 - Duration: Ongoing 1 - Last Use / Amount: 1/9, blunt 1 - Method of Aquiring: Purchase 1- Route of Use: Oral                       ASAM's:  Six Dimensions of Multidimensional Assessment  Dimension 1:  Acute Intoxication and/or Withdrawal Potential:   Dimension 1:  Description of individual's past and current experiences of substance use and withdrawal: daily current use due to grief, no history of addiction or withdrawal  Dimension 2:  Biomedical Conditions and Complications:   Dimension 2:  Description of patient's biomedical conditions and  complications: none reported  Dimension 3:  Emotional, Behavioral, or Cognitive Conditions and Complications:  Dimension 3:  Description of emotional, behavioral, or cognitive conditions and complications: Depression and anxiety  Dimension 4:  Readiness to Change:     Dimension 5:  Relapse, Continued use, or Continued Problem Potential:  Dimension 5:  Relapse, continued use, or continued problem potential critiera description: Continued use  Dimension 6:  Recovery/Living Environment:  Dimension 6:  Recovery/Iiving environment criteria description: lives with father who is supportive but says he has hx of binge drinking alcohol  ASAM Severity Score: ASAM's Severity Rating Score: 4  ASAM Recommended Level of Treatment: ASAM Recommended Level of Treatment: Level I Outpatient Treatment   Substance use Disorder (SUD) Substance Use Disorder (SUD)  Checklist Symptoms of Substance Use: Social, occupational, recreational activities given up or reduced due to use  Recommendations for Services/Supports/Treatments: Recommendations for  Services/Supports/Treatments Recommendations For Services/Supports/Treatments: Individual Therapy, Medication Management  DSM5 Diagnoses: Patient Active Problem List   Diagnosis Date Noted   Tobacco abuse 09/26/2019   DVT (deep venous thrombosis) ([place])    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

## 2021-10-31 ENCOUNTER — Ambulatory Visit (HOSPITAL_COMMUNITY): Payer: 59 | Admitting: Clinical

## 2021-10-31 ENCOUNTER — Other Ambulatory Visit: Payer: Self-pay

## 2021-11-01 ENCOUNTER — Telehealth: Payer: Self-pay

## 2021-11-01 NOTE — Telephone Encounter (Signed)
-----  Message from Yevette Edwards, RN sent at 10/02/2021  9:18 AM EST ----- Regarding: H. pylori H. Pylori stool antigen, the order is in epic. Pt will finish meds on 10/04/21. Pt will pick up kit from [facility]. Due on 1/27

## 2021-11-01 NOTE — Telephone Encounter (Signed)
Spoke with patient to remind her that she is due for H. Pylori stool test at this time. No appointment is necessary. Patient is aware that she can stop by the lab  at the [facility] office at her convenience between 7:30 AM - 5 PM, Monday through Friday. Pt states that she will go by the lab on Monday. She requested that I also send this information to her my chart. Patient verbalized understanding and had no concerns at the end of the call.

## 2021-11-05 ENCOUNTER — Other Ambulatory Visit: Payer: Self-pay

## 2021-11-05 ENCOUNTER — Ambulatory Visit (HOSPITAL_COMMUNITY): Payer: 59 | Admitting: Clinical

## 2021-11-18 ENCOUNTER — Other Ambulatory Visit: Payer: Self-pay

## 2021-11-18 ENCOUNTER — Ambulatory Visit (INDEPENDENT_AMBULATORY_CARE_PROVIDER_SITE_OTHER): Payer: 59 | Admitting: Clinical

## 2021-11-18 DIAGNOSIS — F4321 Adjustment disorder with depressed mood: Secondary | ICD-10-CM | POA: Diagnosis not present

## 2021-11-18 DIAGNOSIS — F4323 Adjustment disorder with mixed anxiety and depressed mood: Secondary | ICD-10-CM | POA: Diagnosis not present

## 2021-11-18 DIAGNOSIS — F1290 Cannabis use, unspecified, uncomplicated: Secondary | ICD-10-CM | POA: Diagnosis not present

## 2021-11-18 DIAGNOSIS — Z634 Disappearance and death of family member: Secondary | ICD-10-CM

## 2021-11-18 NOTE — Progress Notes (Signed)
° °  THERAPIST PROGRESS NOTE  Session Time: 4pm  Participation Level: Active  Behavioral Response: CasualAlert"stressed"  Type of Therapy: Individual Therapy  Treatment Goals addressed: Coping  Interventions: Supportive Virtual Visit via Video Note  I connected with Joan Knight on 11/18/21 at  4:00 PM EST by a video enabled telemedicine application and verified that I am speaking with the correct person using two identifiers.  Location: Patient: home Provider: office   I discussed the limitations of evaluation and management by telemedicine and the availability of in person appointments. The patient expressed understanding and agreed to proceed.   I discussed the assessment and treatment plan with the patient. The patient was provided an opportunity to ask questions and all were answered. The patient agreed with the plan and demonstrated an understanding of the instructions.   The patient was advised to call back or seek an in-person evaluation if the symptoms worsen or if the condition fails to improve as anticipated.  I provided 50 minutes of non-face-to-face time during this encounter.  Summary: Pt reports 1/6 was the anniversary of when she had a miscarriage. On this day, pt says she stayed in the bed and isolated self from others. Since that time pt says she has been less isolative. Pt reports she is providing care for both of her parents. Pt states her father has ongoing hx of alcoholism and her mother  has health complications and is displaced. Pt reports she has been experiencing increased stress and anxiety.  Suicidal/Homicidal: Pt denies SI/HI no AH/VH reported.  Therapist Response: Assessed for changes in mood, behavior and daily functioning. Supported pt as she discussed family dynamic in home. Actively listened as she discussed challenges of being a caretaker to both parents. Provided information on [facility] support group, [employer], [place] for grief  support and supporting loved one impacted by someone else addiction.  Plan: Return again in 2 weeks.  Diagnosis: Axis I: Adjustment disorder with mixed anxiety and depressed mood Grief at loss of child Cannabis use without complication    Axis II: No diagnosis    Yvette Rack, LCSW 11/18/2021

## 2021-11-29 ENCOUNTER — Other Ambulatory Visit: Payer: Self-pay

## 2021-11-29 DIAGNOSIS — A048 Other specified bacterial intestinal infections: Secondary | ICD-10-CM

## 2021-12-01 LAB — H. PYLORI ANTIGEN, STOOL: H pylori Ag, Stl: NEGATIVE

## 2021-12-02 IMAGING — CT CT NECK W/ CM
2 of 3 series · 7 of 14 positions shown, 8 images · IV contrast (iopamidol)
Comparison: CTA of the head/neck 05/14/2019.

CLINICAL DATA: Cervical lymphadenopathy. Additional history
provided by scanning technologist: Patient reports left
supraclavicular lymph node swelling for 1-2 years, hot flashes,
episodes of dizziness, recurrent whole body tingling.

EXAM:
CT NECK WITH CONTRAST
TECHNIQUE: Multidetector CT imaging of the neck was performed using the
standard protocol following the bolus administration of intravenous
contrast.
CONTRAST:  75mL 2IONWI-U77 IOPAMIDOL (2IONWI-U77) INJECTION 61%

[Series 3: neck · axial · 0.58mm/px · z∈[+205,+297]mm · 3 of 94 slices shown]
[im 24/94  bone]
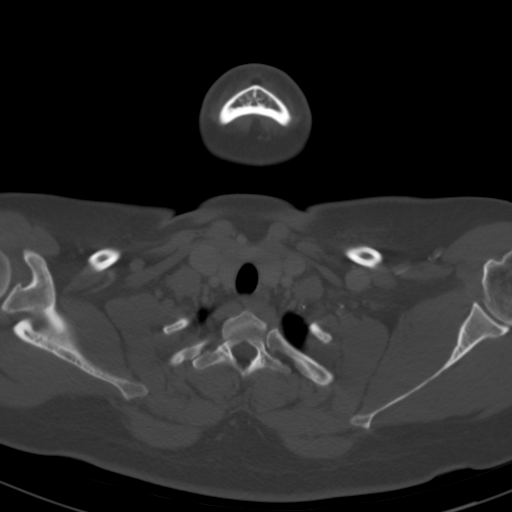
[im 47/94  bone]
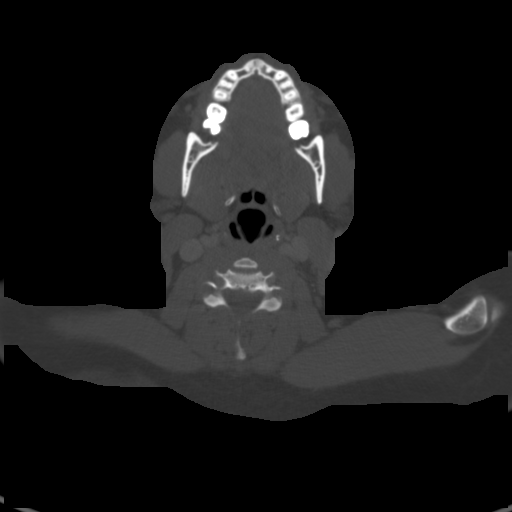
[im 70/94  bone]
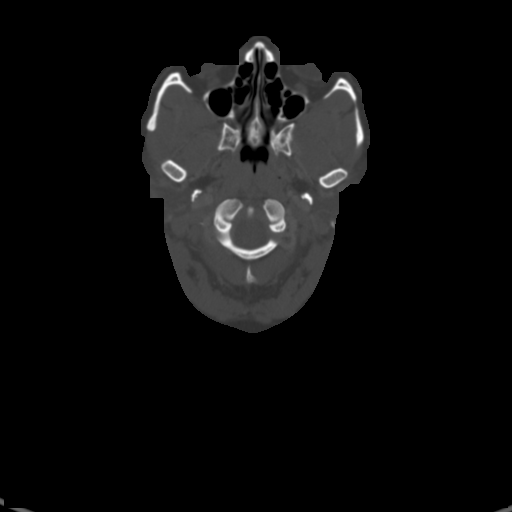

[Series 8: angled axial-oropharynx · axial · 0.39mm/px · z∈[+136,+254]mm · 4 of 121 slices shown, 5 images]
[im 25/121  soft-tissue]
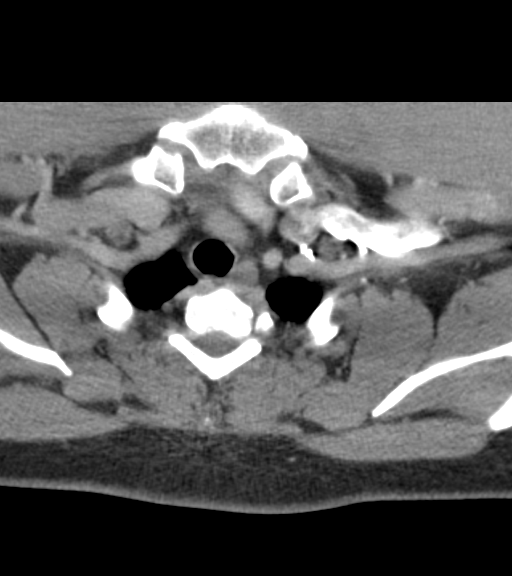
[im 25/121  bone]
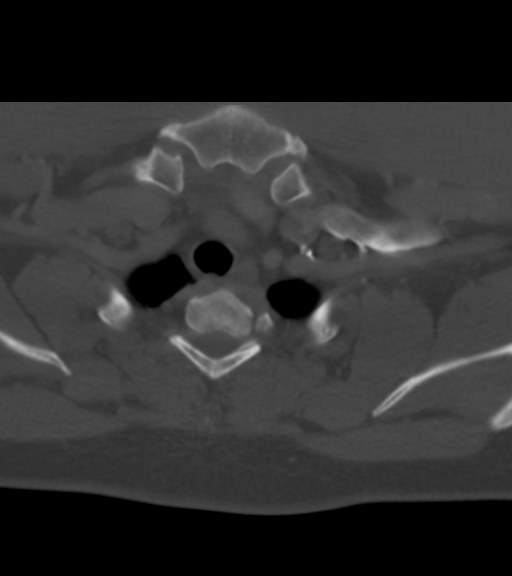
[im 49/121  bone]
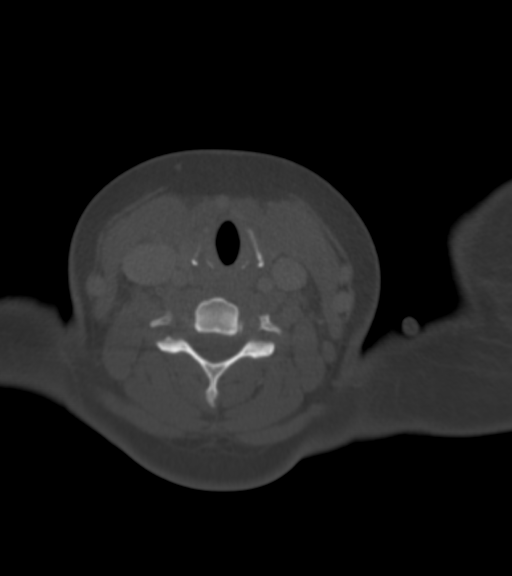
[im 73/121  bone]
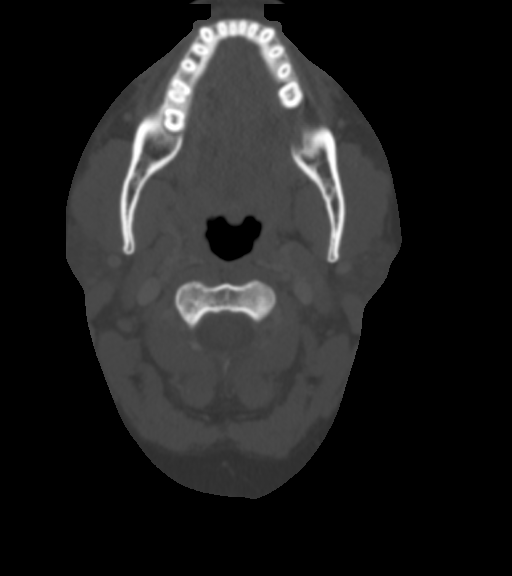
[im 97/121  bone]
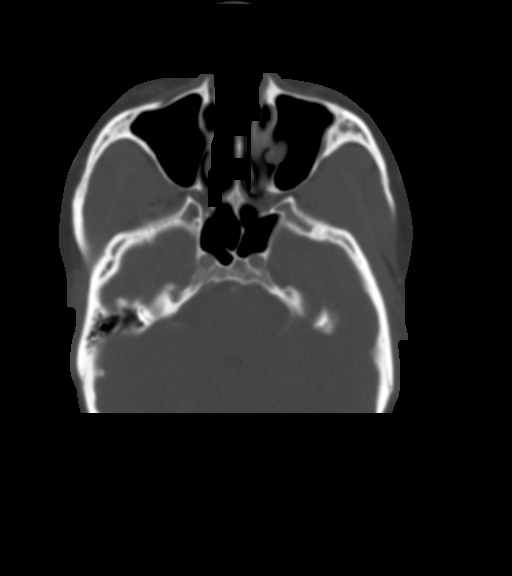

[7 of 14 positions shown; findings below may reference images not displayed]

FINDINGS: Pharynx and larynx: No appreciable swelling or discrete mass within
the oral cavity, pharynx or larynx.

Salivary glands: No inflammation, mass, or stone.

Thyroid: Unremarkable.

Lymph nodes: No pathologically enlarged lymph nodes are identified.
Specifically, no pathologically enlarged lymph nodes are identified
deep to the left supraclavicular skin surface marker. Non-enlarged
left supraclavicular lymph nodes measure up to 7 mm in short axis,
unchanged from the CTA neck of 05/14/2019.

Vascular: The major vascular structures of the neck are patent.

Limited intracranial: No evidence of acute intracranial abnormality
within the field of view.

Visualized orbits: No acute or significant orbital finding.

Mastoids and visualized paranasal sinuses: 14 mm polyp versus mucous
retention cyst within the superomedial left maxillary sinus. Trace
mucosal thickening within the bilateral ethmoid and right sphenoid
sinuses. No significant mastoid effusion.

Skeleton: Reversal of the expected cervical lordosis.
Cervicothoracic dextrocurvature. No acute bony abnormality or
aggressive osseous lesion.

Upper chest: No consolidation within the imaged lung apices.
IMPRESSION: No pathologically enlarged lymph nodes are identified. Specifically,
no pathologically enlarged lymph nodes are present deep to the left
supraclavicular skin surface marker. Non-enlarged left
supraclavicular lymph nodes measure up to 7 mm in short axis,
unchanged from the CTA neck of 05/14/2019.

Paranasal sinus disease, as described. Most notably, there is a 14
mm polyp versus mucous retention cyst within the superomedial left
maxillary sinus.

Cervicothoracic dextrocurvature.

## 2021-12-03 ENCOUNTER — Ambulatory Visit (INDEPENDENT_AMBULATORY_CARE_PROVIDER_SITE_OTHER): Payer: 59 | Admitting: Clinical

## 2021-12-03 ENCOUNTER — Other Ambulatory Visit: Payer: Self-pay

## 2021-12-03 DIAGNOSIS — F4321 Adjustment disorder with depressed mood: Secondary | ICD-10-CM

## 2021-12-03 DIAGNOSIS — F1290 Cannabis use, unspecified, uncomplicated: Secondary | ICD-10-CM | POA: Diagnosis not present

## 2021-12-03 DIAGNOSIS — Z634 Disappearance and death of family member: Secondary | ICD-10-CM | POA: Diagnosis not present

## 2021-12-03 DIAGNOSIS — F4323 Adjustment disorder with mixed anxiety and depressed mood: Secondary | ICD-10-CM | POA: Diagnosis not present

## 2021-12-03 NOTE — Progress Notes (Signed)
° °  THERAPIST PROGRESS NOTE  Session Time: 4pm  Participation Level: Active  Behavioral Response: CasualAlertEuthymic  Type of Therapy: Individual Therapy  Treatment Goals addressed: develop and implement healthy coping methods to manage depression and anxiety sxs as evidenced by reciting positive affirmations daily; listening to music and dancing at least 1x week; be outside in nature 1x per week for up to 30 minutes.  ProgressTowards Goals: Progressing  Interventions: CBT Virtual Visit via Video Note  I connected with Joan Knight on 12/03/21 at  4:00 PM EST by a video enabled telemedicine application and verified that I am speaking with the correct person using two identifiers.  Location: Patient: home Provider: office   I discussed the limitations of evaluation and management by telemedicine and the availability of in person appointments. The patient expressed understanding and agreed to proceed.   I discussed the assessment and treatment plan with the patient. The patient was provided an opportunity to ask questions and all were answered. The patient agreed with the plan and demonstrated an understanding of the instructions.   The patient was advised to call back or seek an in-person evaluation if the symptoms worsen or if the condition fails to improve as anticipated.  I provided 40 minutes of non-face-to-face time during this encounter.  Summary: Pt describes mood as "good, happy" and rates it as 8/10(10=exceptionally well). Pt reports decrease in anxiety and says she has less guilt for telling other no. Pt reports she does not know anyone who practices healthy boundaries and says this is something that is not practiced in her family.  Suicidal/Homicidal: Pt denies SI/HI no AH/VH reported.  Therapist Response: Discussed with pt boundary setting; saying no and accepting no. Pt says when she tells someone no, she overthinks her decision and becomes upset if she is told no.  Explored pt feelings when told no and where it stems from. Pt states not feeling like she matters when she is told no. Assisted pt in identifying signs of healthy relationship.  Plan: Return again in 3 weeks.  Diagnosis: Adjustment disorder with mixed anxiety and depressed mood Grief at loss of child Cannabis use without complication  Collaboration of Care: Other none requested  Patient/Guardian was advised Release of Information must be obtained prior to any record release in order to collaborate their care with an outside provider. Patient/Guardian was advised if they have not already done so to contact the registration department to sign all necessary forms in order for [place] to release information regarding their care.   Consent: Patient/Guardian gives verbal consent for treatment and assignment of benefits for services provided during this visit. Patient/Guardian expressed understanding and agreed to proceed.   Yvette Rack, LCSW 12/03/2021

## 2021-12-26 ENCOUNTER — Ambulatory Visit (INDEPENDENT_AMBULATORY_CARE_PROVIDER_SITE_OTHER): Payer: 59 | Admitting: Clinical

## 2021-12-26 ENCOUNTER — Other Ambulatory Visit: Payer: Self-pay

## 2021-12-26 DIAGNOSIS — Z634 Disappearance and death of family member: Secondary | ICD-10-CM

## 2021-12-26 DIAGNOSIS — F4323 Adjustment disorder with mixed anxiety and depressed mood: Secondary | ICD-10-CM

## 2021-12-26 DIAGNOSIS — F1290 Cannabis use, unspecified, uncomplicated: Secondary | ICD-10-CM

## 2021-12-26 DIAGNOSIS — F4321 Adjustment disorder with depressed mood: Secondary | ICD-10-CM

## 2021-12-26 NOTE — Progress Notes (Signed)
? ?  THERAPIST PROGRESS NOTE ? ?Session Time: 4pm ? ?Participation Level: Active ? ?Behavioral Response: NeatAlertEuthymic ? ?Type of Therapy: Individual Therapy ? ?Treatment Goals addressed: Healthy coping strategies ? ?ProgressTowards Goals: Progressing ? ?Interventions: Supportive ?Virtual Visit via Video Note ? ?I connected with Joan Knight on 12/26/21 at  4:00 PM EDT by a video enabled telemedicine application and verified that I am speaking with the correct person using two identifiers. ? ?Location: ?Patient: home ?Provider: office ?  ?I discussed the limitations of evaluation and management by telemedicine and the availability of in person appointments. The patient expressed understanding and agreed to proceed. ?  ?I discussed the assessment and treatment plan with the patient. The patient was provided an opportunity to ask questions and all were answered. The patient agreed with the plan and demonstrated an understanding of the instructions. ?  ?The patient was advised to call back or seek an in-person evaluation if the symptoms worsen or if the condition fails to improve as anticipated. ? ?I provided 45 minutes of non-face-to-face time during this encounter.  ?Summary: Pt presents in a pleasant mood. Pt appeared to be proud of herself as she discussed progress exercising for the past three weeks. Pt says she is physically active 4/7 days per week and and states improvement in how she feels when she completes her work out. Pt discussed being the care taker for her mother, father and 108 yr old brother. Pt states her brother has mental health and substance use challenges. CSW assessed for changes in mood, behavior and daily functioning. Highlighted some of pt strengths and encouraged her to use and remember the during distressing times. Discussed stress management techniques for handling life stressors. ? ?Suicidal/Homicidal:  Pt denies SI/HI no AH/VH reported. ? ?Plan: Return again in 2 weeks. ? ?Diagnosis:  Adjustment disorder with mixed anxiety and depressed mood ?Grief at loss of child ?Cannabis use without complication ? ?Collaboration of Care: Other none requested ? ?Patient/Guardian was advised Release of Information must be obtained prior to any record release in order to collaborate their care with an outside provider. Patient/Guardian was advised if they have not already done so to contact the registration department to sign all necessary forms in order for [place] to release information regarding their care.  ? ?Consent: Patient/Guardian gives verbal consent for treatment and assignment of benefits for services provided during this visit. Patient/Guardian expressed understanding and agreed to proceed.  ? ?Joan Knight Lynelle Smoke, LCSW ?12/26/2021 ? ?

## 2022-01-07 ENCOUNTER — Encounter (HOSPITAL_COMMUNITY): Payer: Self-pay | Admitting: Psychiatry

## 2022-01-07 ENCOUNTER — Telehealth (HOSPITAL_BASED_OUTPATIENT_CLINIC_OR_DEPARTMENT_OTHER): Payer: 59 | Admitting: Psychiatry

## 2022-01-07 ENCOUNTER — Other Ambulatory Visit: Payer: Self-pay

## 2022-01-07 VITALS — Wt 175.0 lb

## 2022-01-07 DIAGNOSIS — F332 Major depressive disorder, recurrent severe without psychotic features: Secondary | ICD-10-CM | POA: Diagnosis not present

## 2022-01-07 DIAGNOSIS — F419 Anxiety disorder, unspecified: Secondary | ICD-10-CM | POA: Diagnosis not present

## 2022-01-07 DIAGNOSIS — F1290 Cannabis use, unspecified, uncomplicated: Secondary | ICD-10-CM

## 2022-01-07 MED ORDER — HYDROXYZINE PAMOATE 25 MG PO CAPS
25.0000 mg | ORAL_CAPSULE | Freq: Three times a day (TID) | ORAL | 0 refills | Status: DC
  Filled 2022-01-07 – 2022-01-15 (×2): qty 60, 10d supply, fill #0

## 2022-01-07 MED ORDER — SERTRALINE HCL 100 MG PO TABS
100.0000 mg | ORAL_TABLET | Freq: Every day | ORAL | 0 refills | Status: DC
  Filled 2022-01-07 – 2022-01-15 (×2): qty 30, 30d supply, fill #0

## 2022-01-07 NOTE — Progress Notes (Signed)
Virtual Visit via Video Note ? ?I connected with Joan Knight on 01/07/22 at  1:00 PM EDT by a video enabled telemedicine application and verified that I am speaking with the correct person using two identifiers. ? ?Location: ?Patient: In Car ?Provider: Home Office ?  ?I discussed the limitations of evaluation and management by telemedicine and the availability of in person appointments. The patient expressed understanding and agreed to proceed. ? ? ? ? ? ?[place] ?Initial Assessment Note ? ?Joan Knight ?[id number] ?36 y.o. ? ?01/07/2022 ?1:15 PM ? ?Chief Complaint:  ?My therapist referred me to see you. ? ?History of Present Illness:  ?Patient is a 36 year old African-American, single, employed female who is referred from her therapist Sanjuana Kava for medication management.  Patient has done twice IOP and she was prescribed Zoloft and hydroxyzine but she need a provider to continue her medication.  Patient reported despite taking these medication she still have a lot of irritability, mood swing, anger, anxiety and crying spells.  She reported multiple stressors which includes taking care of her mother, father and brother.  She lives with her father and her mother and brother lives together.  She reported poor sleep and crying spells and sometimes she has road rage and impulsive buying.  She denies any suicidal thoughts or homicidal thoughts.  Since she is a primary caretaker of her parents and brother, she gets very busy and overwhelmed with the doctor's appointment.  Patient reported both her parents are disabled.  Her mother and brother has mental disorder.  She admitted smoking marijuana every night to help her sleep and on the weekends she drinks alcohol 2-3 beer Saturday and Sundays.  However she denies any binge, withdrawals, seizures.  She admitted having anger issues and sometimes argument with the family members.  Patient reported the do not understand that I work full-time and  have other responsibilities in her life.  Patient reported when she has time she go to work out.  She reported her appetite is okay and her weight is stable.  Patient currently taking Zoloft 50 mg and hydroxyzine 25 mg as needed.  She feels the medicine is not strong and like to have another options.  She denies any nightmares, flashback.  She denies any history of abuse.  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  She like her new job.  She has no kids.  She is in a relationship but she is not sure about the future of the relationship.  Patient has history of acid reflux and given proton pump inhibitor but currently not taking any medication.  She has not seen her PCP in a while. ? ? ?Past Psychiatric History: ?History of twice IOP.  History of ER visit for severe depression and having suicidal thoughts but does not meet criteria for inpatient services.  Her only medicine is Zoloft which is prescribed at IOP. ? ?Family History  ?Problem Relation Age of Onset  ? Asthma Mother   ? Anxiety disorder Mother   ? Depression Mother   ? Bronchitis Mother   ? Emphysema Mother   ? COPD Mother   ? Alcohol abuse Father   ? Heart disease Father   ?     heart murmur  ? Hypertension Father   ? Deep vein thrombosis Father   ? Depression Father   ? Heart Problems  Father   ? Diabetes Father   ? Breast cancer Maternal Aunt   ? Bone cancer Maternal Grandfather   ? Congenital heart disease Maternal Grandmother   ? Pancreatic cancer Neg Hx   ? Stomach cancer Neg Hx   ? Esophageal cancer Neg Hx   ? Colon cancer Neg Hx   ?  ? ?Past Medical History:  ?Diagnosis Date  ? Anemia   ? Anxiety   ? Depression   ? DVT (deep venous thrombosis) ([place])   ? Had history of DVT in the right lower extremity about 5 years ago.  She tells me she was on blood thinner for about a year and then she stopped taking it.    ? Eczema   ? GERD  (gastroesophageal reflux disease)   ? Nexplanon in place 02/24/2017  ? Sickle cell anemia (HCC)   ? Sickle cell trait ([place])   ? Vaginal Pap smear, abnormal   ? "scraped or cut off" (?LEEP) no problems since  ? ? ? ?Traumatic brain injury: ?Denies any history of traumatic brain injury. ? ?Work History; ?Working as a Sales executive at [employer]. ? ?Psychosocial History; ?Lives with her father.  She has no children.  She has relationship with a boyfriend but she is not sure about the future of the relationship.  She is the primary caretaker of her mother father and brother.  Her brother and father has mental disorder. ? ?History Of Abuse; ?Denies any history of abuse. ? ?Substance Abuse History; ?Admitted history of smoking marijuana every night to help her sleep.  Reported history of drinking beer 2-3 on Saturday and Sunday.  Denies any illegal substance use. ? ?Neurologic: ?Headache: No ?Seizure: No ?Paresthesias: No ? ? ?Outpatient Encounter Medications as of 01/07/2022  ?Medication Sig  ? acetaminophen (TYLENOL) 500 MG tablet Take 1,000 mg by mouth every 6 (six) hours as needed for mild pain, fever or headache.  ? hydrOXYzine (VISTARIL) 25 MG capsule Take 1 capsule (25 mg total) by mouth 3 (three) times daily.  ? omeprazole (PRILOSEC) 20 MG capsule Take 1 capsule (20 mg total) by mouth 2 (two) times daily before a meal for 14 days.  ? omeprazole (PRILOSEC) 40 MG capsule Take 1 capsule (40 mg total) by mouth in the morning and at bedtime. (Patient taking differently: Take 40 mg by mouth daily.)  ? sertraline (ZOLOFT) 50 MG tablet Take 1 tablet (50 mg total) by mouth daily.  ? ?No facility-administered encounter medications on file as of 01/07/2022.  ? ? ?Recent Results (from the past 2160 hour(s))  ?H. pylori antigen, stool     Status: None  ? Collection Time: 11/29/21  2:21 PM  ?Result Value Ref Range  ? H pylori Ag, Stl Negative Negative  ? ? ? ? ?Constitutional: ? Wt 175 lb (79.4 kg)   BMI 33.07 kg/m?   ? ? ?Musculoskeletal: ?Strength & Muscle Tone: within normal limits ?Gait & Station: normal ?Patient leans: N/A ? ?Psychiatric Specialty Exam: ?Physical Exam  ?ROS  ?Weight 175 lb (79.4 kg).There is no height or weight on file to calculate BMI.  ?General Appearance: Casual  ?Eye Contact:  Fair  ?Speech:   fast  ?Volume:  Increased  ?Mood:  Depressed and Irritable  ?Affect:  Labile  ?Thought Process:  Descriptions of Associations: Intact  ?Orientation:  Full (Time, Place, and Person)  ?Thought Content:  Rumination  ?Suicidal Thoughts:  No  ?Homicidal Thoughts:  No  ?Memory:  Immediate;   Good ?Recent;  Good ?Remote;   Good  ?Judgement:  Fair  ?Insight:  Shallow  ?Psychomotor Activity:  Increased  ?Concentration:  Concentration: Fair and Attention Span: Fair  ?Recall:  Fair  ?Fund of Knowledge:  Good  ?Language:  Good  ?Akathisia:  No  ?Handed:  Right  ?AIMS (if indicated):     ?Assets:  Communication Skills ?Desire for Improvement ?Housing ?Talents/Skills ?Transportation  ?ADL's:  Intact  ?Cognition:  WNL  ?Sleep:   fair  ? ? ? ? ?Assessment/Plan:  ?Patient is 36 year old African-American female with history of psychiatric symptoms more than 10 years.  Currently taking Zoloft 50 mg and hydroxyzine 25 mg at bedtime but not helping her symptoms.  She also reported smoking marijuana every night and alcohol on the weekends.  We talk about her current symptoms.  Most likely mood disorder and we will try increasing the Zoloft up to 100 mg daily and hydroxyzine 25-50 mg at bedtime to help anxiety and sleep.  However if symptoms do not improve we will consider adding a low-dose mood stabilizer.  We talk about stopping the marijuana and alcohol as cause interaction with psychotropic medication.  I encouraged to keep appointment with a therapist however her therapist is leaving but she will connect with a new therapist very soon.  I also recommend she should get the physical and blood work as she has not seen PCP in a while.   Discussed safety concerns and anytime having active suicidal thoughts or homicidal thought then she need to call 911 or go to local emergency room.  Follow-up in 3 weeks. ? ? ? ?Follow Up Instructions: ? ?  ?I d

## 2022-01-14 ENCOUNTER — Other Ambulatory Visit: Payer: Self-pay

## 2022-01-15 ENCOUNTER — Other Ambulatory Visit: Payer: Self-pay

## 2022-01-16 ENCOUNTER — Other Ambulatory Visit: Payer: Self-pay

## 2022-01-16 ENCOUNTER — Ambulatory Visit: Payer: Self-pay | Admitting: *Deleted

## 2022-01-16 NOTE — Telephone Encounter (Signed)
?  Chief Complaint:Leg pain ?Symptoms: Pain behind right knee, to calf, ?Frequency: 3 weeks ago ?Pertinent Negatives: Patient denies redness, warmth, swelling ?Disposition: '[x]'$ ED /'[]'$ Urgent Care (no appt availability in office) / '[]'$ Appointment(In office/virtual)/ '[]'$  [facility]/ '[]'$ Home Care/ '[]'$ Refused Recommended Disposition /'[]'$ [facility]/ '[]'$  Follow-up with PCP ?Additional Notes: H/O DVT. Advised ED, unsure will follow disposition. Requested appt for lump in breat, rash, dry, red at neck. Secured appt with Cari at [facility] for Monday. However, reiterated need for eval of leg pain in ED. Unsure pt will follow disposition. ?Reason for Disposition ? [1] Thigh or calf pain AND [2] only 1 side AND [3] present > 1 hour (Exception: chronic unchanged pain) ? ?Answer Assessment - Initial Assessment Questions ?1. ONSET: "When did the pain start?"  ?    2-3 weeks ago ?2. LOCATION: "Where is the pain located?"  ?    Inside of right knee, goes to calf ?3. PAIN: "How bad is the pain?"    (Scale 1-10; or mild, moderate, severe) ?  -  MILD (1-3): doesn't interfere with normal activities  ?  -  MODERATE (4-7): interferes with normal activities (e.g., work or school) or awakens from sleep, limping  ?  -  SEVERE (8-10): excruciating pain, unable to do any normal activities, unable to walk ?    8/10 ?4. WORK OR EXERCISE: "Has there been any recent work or exercise that involved this part of the body?"  ?     ?5. CAUSE: "What do you think is causing the leg pain?" ?    Had DVT in past ?6. OTHER SYMPTOMS: "Do you have any other symptoms?" (e.g., chest pain, back pain, breathing difficulty, swelling, rash, fever, numbness, weakness) ?    Rash at neck, red and dry and itching. Lump in right breast, noted 1 month ago ? ?Protocols used: Leg Pain-A-AH ? ?

## 2022-01-17 ENCOUNTER — Other Ambulatory Visit: Payer: Self-pay

## 2022-01-20 ENCOUNTER — Encounter: Payer: Self-pay | Admitting: Physician Assistant

## 2022-01-20 ENCOUNTER — Other Ambulatory Visit: Payer: Self-pay

## 2022-01-20 ENCOUNTER — Ambulatory Visit (INDEPENDENT_AMBULATORY_CARE_PROVIDER_SITE_OTHER): Payer: Managed Care, Other (non HMO) | Admitting: Physician Assistant

## 2022-01-20 VITALS — BP 118/78 | HR 68 | Temp 98.0°F | Resp 18 | Ht 62.5 in | Wt 171.0 lb

## 2022-01-20 DIAGNOSIS — L309 Dermatitis, unspecified: Secondary | ICD-10-CM

## 2022-01-20 DIAGNOSIS — Z86718 Personal history of other venous thrombosis and embolism: Secondary | ICD-10-CM | POA: Diagnosis not present

## 2022-01-20 DIAGNOSIS — Z6830 Body mass index (BMI) 30.0-30.9, adult: Secondary | ICD-10-CM

## 2022-01-20 DIAGNOSIS — E6609 Other obesity due to excess calories: Secondary | ICD-10-CM | POA: Diagnosis not present

## 2022-01-20 MED ORDER — CLOBETASOL PROPIONATE 0.05 % EX CREA
1.0000 "application " | TOPICAL_CREAM | Freq: Two times a day (BID) | CUTANEOUS | 1 refills | Status: DC
  Filled 2022-01-20: qty 30, 30d supply, fill #0

## 2022-01-20 NOTE — Progress Notes (Signed)
Patient has not eaten today or taken medication ?Patient reports R leg inner knee pain being present last week and resolving after patient took a xarelto. Patient denies pain at this time. ?Patient reports intermittent dry patch appearing on the L side of neck. Patient reports clobetasol aided in relief. ?Patient reports feeling "lumps" underneath both breast, patient denies pain, tenderness or secretions. Patient does report Family Hx with maternal aunt passing away from breast cancer. ?

## 2022-01-20 NOTE — Progress Notes (Signed)
? ?Established Patient Office Visit ? ?Subjective:  ?Patient ID: Joan Knight, female    DOB: July 26, 1986  Age: 36 y.o. MRN: [id number] ? ?CC:  ?Chief Complaint  ?Patient presents with  ? Eczema  ?  Neck  ? Breast Mass  ?  Bilateral underneath breast  ? Leg Pain  ?  R Leg, inner side of knee, Hx of blood clot  ? ? ?HPI ?Joan Knight presents with several complaints.  States that she was having right inner knee pain, and complains of rash on neck and abdomen. ? ?States that she started having pain on her right inner knee on the 10th of this month.  States that she had pain and tightness in her right calf as well.  Denies swelling or redness.  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 ? ?Denies injury or trauma to her knee or calf. ? ?States that she has been having an itchy dry rash on her neck and her torso.  States that she has a significant history of eczema and has previously had prescription cream that she has used with relief.  Denies any new detergents, medications, lotions, body washes, fragrances.  States that the rash has been present for many years intermittently. ? ?States that she is followed by behavioral health for her depression.  States that she does see them on May 5 for follow-up.  PHQ-9 score today 17, GAD-7 score 18.  Adamantly denies any thoughts of self-harm ? ? ?Past Medical History:  ?Diagnosis Date  ? Anemia   ? Anxiety   ? Depression   ? DVT (deep venous thrombosis) ([place])   ? Had history of DVT in the right lower extremity about 5 years ago.  She tells me she was on blood thinner for about a year and then she stopped taking it.    ? Eczema   ? GERD (gastroesophageal reflux disease)   ? Nexplanon in place 02/24/2017  ? Sickle cell anemia (HCC)   ? Sickle cell trait ([place])   ? Vaginal Pap smear, abnormal   ? "scraped or cut off" (?LEEP) no problems since  ? ? ?Past Surgical History:   ?Procedure Laterality Date  ? LEEP    ? REMOVAL OF IMPLANON ROD  09/26/2019  ? ? ?Family History  ?Problem Relation Age of Onset  ? Asthma Mother   ? Anxiety disorder Mother   ? Depression Mother   ? Bronchitis Mother   ? Emphysema Mother   ? COPD Mother   ? Alcohol abuse Father   ? Heart disease Father   ?     heart murmur  ? Hypertension Father   ? Deep vein thrombosis Father   ? Depression Father   ? Heart Problems Father   ? Diabetes Father   ? Breast cancer Maternal Aunt   ? Bone cancer Maternal Grandfather   ? Congenital heart disease Maternal Grandmother   ? Pancreatic cancer Neg Hx   ? Stomach cancer Neg Hx   ? Esophageal cancer Neg Hx   ? Colon cancer Neg Hx   ? ? ?Social History  ? ?Socioeconomic History  ? Marital status: Single  ?  Spouse name: Not on file  ? Number of children: Not on file  ? Years of education: Not on file  ? Highest education level: Not on file  ?Occupational History  ?  Not on file  ?Tobacco Use  ? Smoking status: Every Day  ?  Packs/day: 0.25  ?  Years: 16.00  ?  Pack years: 4.00  ?  Types: Cigarettes  ? Smokeless tobacco: Never  ? Tobacco comments:  ?  Cutting back, plans to quit   ?Vaping Use  ? Vaping Use: Some days  ? Substances: Nicotine  ?Substance and Sexual Activity  ? Alcohol use: Not Currently  ?  Comment: occ/social  ? Drug use: No  ? Sexual activity: Yes  ?  Birth control/protection: None  ?Other Topics Concern  ? Not on file  ?Social History Narrative  ? Not on file  ? ?Social Determinants of Health  ? ?Financial Resource Strain: Not on file  ?Food Insecurity: Not on file  ?Transportation Needs: Not on file  ?Physical Activity: Not on file  ?Stress: Not on file  ?Social Connections: Not on file  ?Intimate Partner Violence: Not on file  ? ? ?Outpatient Medications Prior to Visit  ?Medication Sig Dispense Refill  ? acetaminophen (TYLENOL) 500 MG tablet Take 1,000 mg by mouth every 6 (six) hours as needed for mild pain, fever or headache.    ? hydrOXYzine (VISTARIL) 25 MG  capsule Take 1-2 capsules (25-50 mg total) by mouth 3 (three) times daily. 60 capsule 0  ? omeprazole (PRILOSEC) 20 MG capsule Take 1 capsule (20 mg total) by mouth 2 (two) times daily before a meal for 14 days. 28 capsule 0  ? omeprazole (PRILOSEC) 40 MG capsule Take 1 capsule (40 mg total) by mouth in the morning and at bedtime. (Patient taking differently: Take 40 mg by mouth daily.) 60 capsule 5  ? sertraline (ZOLOFT) 100 MG tablet Take 1 tablet (100 mg total) by mouth daily. 30 tablet 0  ? ?No facility-administered medications prior to visit.  ? ? ?No Known Allergies ? ?ROS ?Review of Systems  ?Constitutional: Negative.   ?HENT: Negative.    ?Eyes: Negative.   ?Respiratory:  Negative for shortness of breath.   ?Cardiovascular:  Negative for chest pain and leg swelling.  ?Gastrointestinal: Negative.   ?Endocrine: Negative.   ?Genitourinary: Negative.   ?Musculoskeletal: Negative.   ?Skin:  Positive for rash.  ?Allergic/Immunologic: Negative.   ?Neurological:  Negative for dizziness, seizures, syncope and speech difficulty.  ?Hematological: Negative.   ?Psychiatric/Behavioral:  Positive for dysphoric mood. Negative for self-injury, sleep disturbance and suicidal ideas. The patient is nervous/anxious.   ? ?  ?Objective:  ?  ?Physical Exam ?Vitals and nursing note reviewed.  ?Constitutional:   ?   Appearance: Normal appearance.  ?HENT:  ?   Head: Normocephalic and atraumatic.  ?   Right Ear: External ear normal.  ?   Left Ear: External ear normal.  ?   Nose: Nose normal.  ?   Mouth/Throat:  ?   Mouth: Mucous membranes are moist.  ?   Pharynx: Oropharynx is clear.  ?Eyes:  ?   Extraocular Movements: Extraocular movements intact.  ?   Conjunctiva/sclera: Conjunctivae normal.  ?   Pupils: Pupils are equal, round, and reactive to light.  ?Cardiovascular:  ?   Rate and Rhythm: Normal rate and regular rhythm.  ?   Pulses: Normal pulses.  ?   Heart sounds: Normal heart sounds.  ?Pulmonary:  ?   Effort: Pulmonary effort is  normal.  ?   Breath sounds: Normal breath sounds.  ?Musculoskeletal:     ?   General: No swelling. Normal range of motion.  ?  Cervical back: Normal range of motion and neck supple.  ?   Right knee: Normal. No swelling or bony tenderness. Normal range of motion. No tenderness.  ?   Left knee: Normal. No swelling or bony tenderness. Normal range of motion. No tenderness.  ?   Right lower leg: Normal. No swelling or tenderness. No edema.  ?   Left lower leg: Normal.  ?Skin: ?   General: Skin is warm and dry.  ?   Findings: Rash present. Rash is scaling. Rash is not pustular.  ?   Comments: Silvery scaling plaque on left side of neck and small area on abdomen  ?Neurological:  ?   General: No focal deficit present.  ?   Mental Status: She is alert and oriented to person, place, and time.  ?Psychiatric:     ?   Mood and Affect: Mood normal.     ?   Behavior: Behavior normal.     ?   Thought Content: Thought content normal.     ?   Judgment: Judgment normal.  ? ? ?BP 118/78 (BP Location: Right Arm, Patient Position: Sitting, Cuff Size: Large)   Pulse 68   Temp 98 ?F (36.7 ?C) (Oral)   Resp 18   Ht 5' 2.5" (1.588 m)   Wt 171 lb (77.6 kg)   LMP 12/24/2021   SpO2 98%   BMI 30.78 kg/m?  ?Wt Readings from Last 3 Encounters:  ?01/20/22 171 lb (77.6 kg)  ?04/02/21 177 lb 14.4 oz (80.7 kg)  ?03/15/21 177 lb 3.2 oz (80.4 kg)  ? ? ? ?Health Maintenance Due  ?Topic Date Due  ? COVID-19 Vaccine (1) Never done  ? Hepatitis C Screening  Never done  ? ? ?There are no preventive care reminders to display for this patient. ? ?Lab Results  ?Component Value Date  ? TSH 0.267 (L) 08/05/2019  ? ?Lab Results  ?Component Value Date  ? WBC 5.2 09/18/2021  ? HGB 13.2 09/18/2021  ? HCT 37.0 09/18/2021  ? MCV 84.5 09/18/2021  ? PLT 316 09/18/2021  ? ?Lab Results  ?Component Value Date  ? NA 137 09/18/2021  ? K 4.1 09/18/2021  ? CO2 26 09/18/2021  ? GLUCOSE 93 09/18/2021  ? BUN 19 09/18/2021  ? CREATININE 0.80 09/18/2021  ? BILITOT 0.6  09/18/2021  ? ALKPHOS 42 09/18/2021  ? AST 15 09/18/2021  ? ALT 13 09/18/2021  ? PROT 7.4 09/18/2021  ? ALBUMIN 4.2 09/18/2021  ? CALCIUM 9.1 09/18/2021  ? ANIONGAP 5 09/18/2021  ? ?No results found for: CHOL ?No

## 2022-01-20 NOTE — Patient Instructions (Signed)
I have ordered Doppler ultrasound to be completed of your right lower leg. ? ?I sent a prescription cream to your pharmacy to help you with your eczema. ? ?We will call you with the results. ? ?Kennieth Rad, PA-C ?Physician Assistant ?[facility] ?[email or web address] ? ? ?Deep Vein Thrombosis ? ?Deep vein thrombosis (DVT) is a condition in which a blood clot forms in a vein of the deep venous system. This can occur in the lower leg, thigh, pelvis, arm, or neck. A clot is blood that has thickened into a gel or solid. This condition is serious and can be life-threatening if the clot travels to the arteries of the lungs and causes a blockage (pulmonary embolism). A DVT can also damage veins in the leg, which can lead to long-term venous disease, leg pain, swelling, discoloration, and ulcers or sores (post-thrombotic syndrome). ?What are the causes? ?This condition may be caused by: ?A slowdown of blood flow. ?Damage to a vein. ?A condition that causes blood to clot more easily, such as certain bleeding disorders. ?What increases the risk? ?The following factors may make you more likely to develop this condition: ?Obesity. ?Being older, especially older than age 54. ?Being inactive or not moving around (sedentary lifestyle). This may include: ?Sitting or lying down for longer than 4-6 hours other than to sleep at night. ?Being in the hospital, or having major or lengthy surgery. ?Having any recent bone injuries, such as breaks (fractures), that reduce movement, especially in the lower extremities. ?Having recent orthopedic surgery on the lower extremities. ?Being pregnant, giving birth, or having recently given birth. ?Taking medicines that contain estrogen, such as birth control or hormone replacement therapy. ?Using products that contain nicotine or tobacco, especially if you use hormonal birth control. ?Having a history of a blood vessel disease (peripheral  vascular disease) or congestive heart disease. ?Having a history of cancer, especially if being treated with chemotherapy. ?What are the signs or symptoms? ?Symptoms of this condition include: ?Swelling, pain, pressure, or tenderness in an arm or a leg. ?An arm or a leg becoming warm, red, or discolored. ?A leg turning very pale or blue. You may have a large DVT. This is rare. ?If the clot is in your leg, you may notice that symptoms get worse when you stand or walk. ?In some cases, there are no symptoms. ?How is this diagnosed? ?This condition is diagnosed with: ?Your medical history and a physical exam. ?Tests, such as: ?Blood tests to check how well your blood clots. ?Doppler ultrasound. This is the best way to find a DVT. ?CT venogram. Contrast dye is injected into a vein, and X-rays are taken to check for clots. This is helpful for veins in the chest or pelvis. ?How is this treated? ?Treatment for this condition depends on: ?The cause of your DVT. ?The size and location of your DVT, or having more than one DVT. ?Your risk for bleeding or developing more clots. ?Other medical conditions you may have. ?Treatment may include: ?Taking a blood thinner medicine (anticoagulant) to prevent more clots from forming or current clots from growing. ?Wearing compression stockings. ?Injecting medicines into the affected vein to break up the clot (catheter-directed thrombolysis). ?Surgical procedures, when DVT is severe or hard to treat. These may be done to: ?Isolate and remove your clot. ?Place an inferior vena cava (IVC) filter. This filter is placed into a large vein called the inferior vena cava to catch blood clots before they reach your lungs. ?You may get  some medical treatments for 6 months or longer. ?Follow these instructions at home: ?If you are taking blood thinners: ?Talk with your health care provider before you take any medicines that contain aspirin or NSAIDs, such as ibuprofen. These medicines increase your  risk for dangerous bleeding. ?Take your medicine exactly as told, at the same time every day. Do not skip a dose. Do not take more than the prescribed dose. This is important. ?Ask your health care provider about foods and medicines that could change or interact with the way your blood thinner works. Avoid these foods and medicines if you are told to do so. ?Avoid anything that may cause bleeding or bruising. You may bleed more easily while taking blood thinners. ?Be very careful when using knives, scissors, or other sharp objects. ?Use an electric razor instead of a blade. ?Avoid activities that could cause injury or bruising, and follow instructions for preventing falls. ?Tell your health care provider if you have had any internal bleeding, bleeding ulcers, or neurologic diseases, such as strokes or cerebral aneurysms. ?Wear a medical alert bracelet or carry a card that lists what medicines you take. ?General instructions ?Take over-the-counter and prescription medicines only as told by your health care provider. ?Return to your normal activities as told by your health care provider. Ask your health care provider what activities are safe for you. ?If recommended, wear compression stockings as told by your health care provider. These stockings help to prevent blood clots and reduce swelling in your legs. Never wear your compression stockings while sleeping at night. ?Keep all follow-up visits. This is important. ?Where to find more information ?American Heart Association: www.heart.org ?Centers for Disease Control and Prevention: http://www.wolf.info/ ?National Heart, Lung, and Blood Institute: https://wilson-eaton.com/ ?Contact a health care provider if: ?You miss a dose of your blood thinner. ?You have unusual bruising or other color changes. ?You have new or worse pain, swelling, or redness in an arm or a leg. ?You have worsening numbness or tingling in an arm or a leg. ?You have a significant color change (pale or blue) in the  extremity that has the DVT. ?Get help right away if: ?You have signs or symptoms that a blood clot has moved to the lungs. These may include: ?Shortness of breath. ?Chest pain. ?Fast or irregular heartbeats (palpitations). ?Light-headedness, dizziness, or fainting. ?Coughing up blood. ?You have signs or symptoms that your blood is too thin. These may include: ?Blood in your vomit, stool, or urine. ?A cut that will not stop bleeding. ?A menstrual period that is heavier than usual. ?A severe headache or confusion. ?These symptoms may be an emergency. Get help right away. Call 911. ?Do not wait to see if the symptoms will go away. ?Do not drive yourself to the hospital. ?Summary ?Deep vein thrombosis (DVT) happens when a blood clot forms in a deep vein. This may occur in the lower leg, thigh, pelvis, arm, or neck. ?Symptoms affect the arm or leg and can include swelling, pain, tenderness, warmth, redness, or discoloration. ?This condition may be treated with medicines. In severe cases, a procedure or surgery may be done to remove or dissolve the clots. ?If you are taking blood thinners, take them exactly as told. Do not skip a dose. Do not take more than is prescribed. ?Get help right away if you have a severe headache, shortness of breath, chest pain, fast or irregular heartbeats, or blood in your vomit, urine, or stool. ?This information is not intended to replace advice given to  you by your health care provider. Make sure you discuss any questions you have with your health care provider. ?Document Revised: 04/15/2021 Document Reviewed: 04/15/2021 ?Elsevier Patient Education ? [place]. ? ?

## 2022-01-21 ENCOUNTER — Ambulatory Visit (HOSPITAL_COMMUNITY)
Admission: RE | Admit: 2022-01-21 | Discharge: 2022-01-21 | Disposition: A | Discharge status: Home / Self Care | Payer: Managed Care, Other (non HMO) | Source: Ambulatory Visit | Attending: Physician Assistant | Admitting: Physician Assistant

## 2022-01-21 ENCOUNTER — Encounter: Payer: Self-pay | Admitting: Physician Assistant

## 2022-01-21 DIAGNOSIS — R609 Edema, unspecified: Secondary | ICD-10-CM

## 2022-01-21 DIAGNOSIS — Z86718 Personal history of other venous thrombosis and embolism: Secondary | ICD-10-CM

## 2022-01-21 NOTE — Progress Notes (Signed)
Right lower extremity venous duplex completed. ?Refer to "CV Proc" under chart review to view preliminary results. ? ?01/21/2022 3:03 PM ?Kelby Aline., MHA, RVT, RDCS, RDMS   ?

## 2022-01-23 ENCOUNTER — Ambulatory Visit (INDEPENDENT_AMBULATORY_CARE_PROVIDER_SITE_OTHER): Payer: 59 | Admitting: Clinical

## 2022-01-23 DIAGNOSIS — F4323 Adjustment disorder with mixed anxiety and depressed mood: Secondary | ICD-10-CM | POA: Diagnosis not present

## 2022-01-24 ENCOUNTER — Telehealth: Payer: Self-pay | Admitting: *Deleted

## 2022-01-24 NOTE — Telephone Encounter (Signed)
Patient verified DOB ?Patient is aware of no DVT being noted. ? ?

## 2022-01-24 NOTE — Progress Notes (Signed)
? ?  THERAPIST PROGRESS NOTE ? ?Session Time: 4pm ? ?Participation Level: Active ? ?Behavioral Response: CasualAlertpleasant ? ?Type of Therapy: Individual Therapy ? ?ProgressTowards Goals: Progressing ? ?Interventions: Supportive ?Virtual Visit via Video Note ? ?I connected with Joan Knight on 01/24/22 at  4:00 PM EDT by a video enabled telemedicine application and verified that I am speaking with the correct person using two identifiers. ? ?Location: ?Patient: home ?Provider: office ?  ?I discussed the limitations of evaluation and management by telemedicine and the availability of in person appointments. The patient expressed understanding and agreed to proceed. ?  ?I discussed the assessment and treatment plan with the patient. The patient was provided an opportunity to ask questions and all were answered. The patient agreed with the plan and demonstrated an understanding of the instructions. ?  ?The patient was advised to call back or seek an in-person evaluation if the symptoms worsen or if the condition fails to improve as anticipated. ? ?I provided 15 minutes of non-face-to-face time during this encounter.  ?Summary: Last session complete; referral processed discussed to resume individual therapy. Pt request referral for AA female clinician. Producer, television/film/video pt list of community mental health providers that meet her preferences. No safety concerns reported. Pt denies plan, intent or attempt to harm self or others. ? ?Collaboration of Care: Other referrals for SEL group, [facility] counseling and [facility] provided. ? ?Patient/Guardian was advised Release of Information must be obtained prior to any record release in order to collaborate their care with an outside provider. Patient/Guardian was advised if they have not already done so to contact the registration department to sign all necessary forms in order for [place] to release information regarding their care.  ? ?Consent: Patient/Guardian  gives verbal consent for treatment and assignment of benefits for services provided during this visit. Patient/Guardian expressed understanding and agreed to proceed.  ? ?Taiana Temkin Lynelle Smoke, LCSW ?01/24/2022 ? ?

## 2022-01-24 NOTE — Telephone Encounter (Signed)
-----   Message from Kennieth Rad, [place] sent at 01/22/2022  1:09 PM EDT ----- ?Please call patient let her know that her Doppler study was negative for any type of DVT. ?

## 2022-01-31 ENCOUNTER — Telehealth (HOSPITAL_COMMUNITY): Payer: 59 | Admitting: Psychiatry

## 2022-02-05 ENCOUNTER — Other Ambulatory Visit: Payer: Self-pay

## 2022-02-05 ENCOUNTER — Encounter (HOSPITAL_COMMUNITY): Payer: Self-pay | Admitting: Psychiatry

## 2022-02-05 ENCOUNTER — Telehealth (HOSPITAL_BASED_OUTPATIENT_CLINIC_OR_DEPARTMENT_OTHER): Payer: Commercial Managed Care - HMO | Admitting: Psychiatry

## 2022-02-05 DIAGNOSIS — F1290 Cannabis use, unspecified, uncomplicated: Secondary | ICD-10-CM

## 2022-02-05 DIAGNOSIS — F419 Anxiety disorder, unspecified: Secondary | ICD-10-CM

## 2022-02-05 DIAGNOSIS — F332 Major depressive disorder, recurrent severe without psychotic features: Secondary | ICD-10-CM | POA: Diagnosis not present

## 2022-02-05 MED ORDER — SERTRALINE HCL 100 MG PO TABS
100.0000 mg | ORAL_TABLET | Freq: Every day | ORAL | 1 refills | Status: DC
  Filled 2022-02-05: qty 30, 30d supply, fill #0

## 2022-02-05 MED ORDER — HYDROXYZINE PAMOATE 25 MG PO CAPS
25.0000 mg | ORAL_CAPSULE | Freq: Every evening | ORAL | 0 refills | Status: DC | PRN
Start: 2022-02-05 — End: 2022-03-12
  Filled 2022-02-05: qty 60, 15d supply, fill #0

## 2022-02-05 NOTE — Progress Notes (Signed)
Virtual Visit via Video Note ? ?I connected with Joan Knight on 02/05/22 at  2:40 PM EDT by a video enabled telemedicine application and verified that I am speaking with the correct person using two identifiers. ? ?Location: ?Patient: Home ?Provider: Home Office ?  ?I discussed the limitations of evaluation and management by telemedicine and the availability of in person appointments. The patient expressed understanding and agreed to proceed. ? ?History of Present Illness: ?Patient is a 36 year old African-American single, employed female who is seen first time few weeks ago as she referred from IOP.  She was taking Zoloft and hydroxyzine but is still have symptoms of irritability, depression, crying spells, anxiety and passive and fleeting suicidal thoughts.  We have recommended to increase Zoloft 100 mg and take hydroxyzine regularly.  She noticed some improvement in her mood as no more suicidal thoughts and crying is less than before.  She is sleeping better.  She stopped smoking marijuana 1 week ago.  However she still have anxiety because of too many stressors in her life.  Her mother is trying to find a new place under her budget but unable to get 1.  Her mother and father are disabled.  Patient has a brother who has mental issues.  Her mother and brother lives together and patient lives with her father.  She is very busy taking care of her parents who need doctor's appointments.  She also not happy with her relationship because boyfriend lives in [place] and doing multiple jobs and do not have enough time together.  She is working at [employer] and now looking for another part-time job.  She started therapy with Darren but Darren had left the practice and she has not able to find a new therapist.  She reported no concerns or side effects from higher dose of Zoloft.  She has no tremors, shakes, headaches.  She admitted her anger issue has somewhat better.  Her appetite is okay.  Her weight is unchanged  from the past.  She tried to walk and exercise every day.  ?  ?Past Psychiatric History: ?H/O twice IOP and ER visit for depression and suicidal thoughts. No h/o inpatient.  ? ?Psychiatric Specialty Exam: ?Physical Exam  ?Review of Systems  ?Weight 175 lb (79.4 kg).There is no height or weight on file to calculate BMI.  ?General Appearance: Casual  ?Eye Contact:  Fair  ?Speech:  Slow  ?Volume:  Normal  ?Mood:  Anxious and Dysphoric  ?Affect:  Constricted and Depressed  ?Thought Process:  Goal Directed  ?Orientation:  Full (Time, Place, and Person)  ?Thought Content:  Logical  ?Suicidal Thoughts:  No  ?Homicidal Thoughts:  No  ?Memory:  Immediate;   Good ?Recent;   Good ?Remote;   Good  ?Judgement:  Fair  ?Insight:  Present  ?Psychomotor Activity:  Normal  ?Concentration:  Concentration: Fair and Attention Span: Fair  ?Recall:  Good  ?Fund of Knowledge:  Good  ?Language:  Good  ?Akathisia:  No  ?Handed:  Right  ?AIMS (if indicated):     ?Assets:  Communication Skills ?Desire for Improvement ?Housing ?Transportation  ?ADL's:  Intact  ?Cognition:  WNL  ?Sleep:     ? ? ? ? ?Assessment and Plan: ?Major depressive disorder, recurrent.  Anxiety.  Cannabis use without complication. ? ?Patient tolerating Zoloft 100 mg daily and hydroxyzine 25 mg at bedtime.  She noticed improvement but now increased stress in her life as mother did not able to find a new place.  She is taking care of her mother and brother who live together and father who lives with the patient.  Patient also looking for a part-time job.  She is also looking for a therapist.  Patient told to many things going on in her life but wants to give more time to the current dose of medication.  She would like to have a follow-up in 4 weeks and if symptoms do not improve then she is willing to try a mood stabilizer or any new medication.  We will provide the names of the therapist for coping skills.  Encourage exercise, use of breathing technique when she is under  stress.  Encouraged to stay away from marijuana which she has been for past 1 week.  Discussed safety concerns and anytime having active suicidal thoughts or homicidal thought then she need to call 911 or go to local emergency room.  Follow-up in 4 weeks. ? ?Follow Up Instructions: ? ?  ?I discussed the assessment and treatment plan with the patient. The patient was provided an opportunity to ask questions and all were answered. The patient agreed with the plan and demonstrated an understanding of the instructions. ?  ?The patient was advised to call back or seek an in-person evaluation if the symptoms worsen or if the condition fails to improve as anticipated. ? ?Collaboration of Care: Primary Care Provider AEB notes are available in epic to review. ? ?Patient/Guardian was advised Release of Information must be obtained prior to any record release in order to collaborate their care with an outside provider. Patient/Guardian was advised if they have not already done so to contact the registration department to sign all necessary forms in order for [place] to release information regarding their care.  ? ?Consent: Patient/Guardian gives verbal consent for treatment and assignment of benefits for services provided during this visit. Patient/Guardian expressed understanding and agreed to proceed.   ? ?I provided 26 minutes of non-face-to-face time during this encounter. ? ? ?Kathlee Nations, MD  ?

## 2022-02-12 ENCOUNTER — Other Ambulatory Visit: Payer: Self-pay

## 2022-03-12 ENCOUNTER — Telehealth (HOSPITAL_BASED_OUTPATIENT_CLINIC_OR_DEPARTMENT_OTHER): Payer: Commercial Managed Care - HMO | Admitting: Psychiatry

## 2022-03-12 ENCOUNTER — Other Ambulatory Visit: Payer: Self-pay

## 2022-03-12 ENCOUNTER — Encounter (HOSPITAL_COMMUNITY): Payer: Self-pay | Admitting: Psychiatry

## 2022-03-12 VITALS — Wt 173.0 lb

## 2022-03-12 DIAGNOSIS — F419 Anxiety disorder, unspecified: Secondary | ICD-10-CM

## 2022-03-12 DIAGNOSIS — F332 Major depressive disorder, recurrent severe without psychotic features: Secondary | ICD-10-CM

## 2022-03-12 DIAGNOSIS — F1290 Cannabis use, unspecified, uncomplicated: Secondary | ICD-10-CM | POA: Diagnosis not present

## 2022-03-12 MED ORDER — HYDROXYZINE PAMOATE 25 MG PO CAPS
25.0000 mg | ORAL_CAPSULE | Freq: Every evening | ORAL | 1 refills | Status: DC | PRN
  Filled 2022-03-12: qty 30, 15d supply, fill #0

## 2022-03-12 MED ORDER — SERTRALINE HCL 100 MG PO TABS
100.0000 mg | ORAL_TABLET | Freq: Every day | ORAL | 1 refills | Status: DC
  Filled 2022-03-12: qty 30, 30d supply, fill #0

## 2022-03-12 NOTE — Progress Notes (Signed)
Virtual Visit via Video Note  I connected with Joan Knight on 03/12/22 at  3:20 PM EDT by a video enabled telemedicine application and verified that I am speaking with the correct person using two identifiers.  Location: Patient: Home Provider: Home Office   I discussed the limitations of evaluation and management by telemedicine and the availability of in person appointments. The patient expressed understanding and agreed to proceed.  History of Present Illness: Patient is evaluated video session.  She is doing well on Zoloft and taking hydroxyzine at bedtime.  She is sleeping at least 8 hours.  She is excited because going to [place] with boyfriend to join sneaker party.  She also having upcoming cruise trip with her friends.  She has sometime highs and lows in her mood but overall she feels medicine is working.  Her mother and brother still not able to find a bigger place and patient tried to help them as much as possible.  She lives with her father who is disabled.  Patient reported the relationship with the boyfriend is going okay and he is trying to give more time but she is not sure how long it will take.  Her job is going okay.  She denies any mania, hallucination, suicidal thoughts.  She is looking for a therapist and we have recommended in the past but no one had called her back.  She feels the current medicine working and does not want any mood stabilizers.  She has no tremors, shakes or any EPS.  Her appetite is okay.  Her weight is stable.  She had cut down her marijuana use.  Past Psychiatric History: H/O twice IOP and ER visit for depression and suicidal thoughts. No h/o inpatient.    Psychiatric Specialty Exam: Physical Exam  Review of Systems  Weight 173 lb (78.5 kg).There is no height or weight on file to calculate BMI.  General Appearance: Casual  Eye Contact:  Good  Speech:  Clear and Coherent and Normal Rate  Volume:  Normal  Mood:  Irritable  Affect:   Appropriate  Thought Process:  Goal Directed  Orientation:  Full (Time, Place, and Person)  Thought Content:  Logical  Suicidal Thoughts:  No  Homicidal Thoughts:  No  Memory:  Immediate;   Good Recent;   Good Remote;   Good  Judgement:  Intact  Insight:  Present  Psychomotor Activity:  Normal  Concentration:  Concentration: Fair and Attention Span: Fair  Recall:  Good  Fund of Knowledge:  Good  Language:  Good  Akathisia:  No  Handed:  Right  AIMS (if indicated):     Assets:  Communication Skills Desire for Improvement Housing Resilience Transportation  ADL's:  Intact  Cognition:  WNL  Sleep:   8 hrs      Assessment and Plan: Major depressive disorder, recurrent.  Anxiety.  Mild cannabis use.  Patient doing better and is stable on current medication.  Continue Zoloft 100 mg daily and hydroxyzine 25 mg at bedtime.  We will provide contact information for Elnora Morrison for therapy.  Patient is tolerating her medication and denies any tremor or shakes or any EPS.  Recommended to call us back if she has any question or any concern.  Follow-up in 2 months.  Follow Up Instructions:    I discussed the assessment and treatment plan with the patient. The patient was provided an opportunity to ask questions and all were answered. The patient agreed with the plan and  demonstrated an understanding of the instructions.   The patient was advised to call back or seek an in-person evaluation if the symptoms worsen or if the condition fails to improve as anticipated.  Collaboration of Care: Primary Care Provider AEB notes are available in epic to review.   Patient/Guardian was advised Release of Information must be obtained prior to any record release in order to collaborate their care with an outside provider. Patient/Guardian was advised if they have not already done so to contact the registration department to sign all necessary forms in order for [place] to release information regarding  their care.   Consent: Patient/Guardian gives verbal consent for treatment and assignment of benefits for services provided during this visit. Patient/Guardian expressed understanding and agreed to proceed.    I provided 18 minutes of non-face-to-face time during this encounter.   Kathlee Nations, MD

## 2022-03-19 ENCOUNTER — Other Ambulatory Visit: Payer: Self-pay

## 2022-03-26 ENCOUNTER — Ambulatory Visit: Payer: Managed Care, Other (non HMO) | Admitting: Family Medicine

## 2022-04-03 ENCOUNTER — Encounter: Payer: Self-pay | Admitting: Family Medicine

## 2022-04-03 ENCOUNTER — Other Ambulatory Visit: Payer: Self-pay

## 2022-04-03 ENCOUNTER — Ambulatory Visit (INDEPENDENT_AMBULATORY_CARE_PROVIDER_SITE_OTHER): Payer: Commercial Managed Care - HMO | Admitting: Family Medicine

## 2022-04-03 VITALS — BP 113/74 | HR 89 | Temp 98.1°F | Resp 16 | Ht 62.0 in | Wt 174.0 lb

## 2022-04-03 DIAGNOSIS — F17210 Nicotine dependence, cigarettes, uncomplicated: Secondary | ICD-10-CM | POA: Diagnosis not present

## 2022-04-03 DIAGNOSIS — K219 Gastro-esophageal reflux disease without esophagitis: Secondary | ICD-10-CM | POA: Diagnosis not present

## 2022-04-03 DIAGNOSIS — F411 Generalized anxiety disorder: Secondary | ICD-10-CM | POA: Diagnosis not present

## 2022-04-03 DIAGNOSIS — Z720 Tobacco use: Secondary | ICD-10-CM

## 2022-04-03 MED ORDER — OMEPRAZOLE 40 MG PO CPDR
40.0000 mg | DELAYED_RELEASE_CAPSULE | Freq: Every day | ORAL | 1 refills | Status: DC
  Filled 2022-04-03: qty 30, 30d supply, fill #0

## 2022-04-04 ENCOUNTER — Encounter: Payer: Self-pay | Admitting: Family Medicine

## 2022-04-04 NOTE — Progress Notes (Signed)
Established Patient Office Visit  Subjective    Patient ID: Joan Knight, female    DOB: Nov 23, 1985  Age: 36 y.o. MRN: [id number]  CC: No chief complaint on file.   HPI Joan Knight presents to  establish with a new provider and for routine follow up of chronic med issues.    Outpatient Encounter Medications as of 04/03/2022  Medication Sig   acetaminophen (TYLENOL) 500 MG tablet Take 1,000 mg by mouth every 6 (six) hours as needed for mild pain, fever or headache.   clobetasol cream (TEMOVATE) 8.67 % Apply 1 application. topically 2 (two) times daily.   hydrOXYzine (VISTARIL) 25 MG capsule Take 1 capsule (25 mg total) by mouth at bedtime and may repeat dose one time if needed.   sertraline (ZOLOFT) 100 MG tablet Take 1 tablet (100 mg total) by mouth daily.   [DISCONTINUED] omeprazole (PRILOSEC) 40 MG capsule Take 1 capsule (40 mg total) by mouth in the morning and at bedtime. (Patient taking differently: Take 40 mg by mouth daily.)   omeprazole (PRILOSEC) 20 MG capsule Take 1 capsule (20 mg total) by mouth 2 (two) times daily before a meal for 14 days.   omeprazole (PRILOSEC) 40 MG capsule Take 1 capsule (40 mg total) by mouth daily.   No facility-administered encounter medications on file as of 04/03/2022.    Past Medical History:  Diagnosis Date   Anemia    Anxiety    Depression    DVT (deep venous thrombosis) ([place])    Had history of DVT in the right lower extremity about 5 years ago.  She tells me she was on blood thinner for about a year and then she stopped taking it.     Eczema    GERD (gastroesophageal reflux disease)    Nexplanon in place 02/24/2017   Sickle cell anemia (HCC)    Sickle cell trait (HCC)    Vaginal Pap smear, abnormal    "scraped or cut off" (?LEEP) no problems since    Past Surgical History:  Procedure Laterality Date   LEEP     REMOVAL OF IMPLANON ROD  09/26/2019    Family History  Problem Relation Age of Onset   Asthma Mother     Anxiety disorder Mother    Depression Mother    Bronchitis Mother    Emphysema Mother    COPD Mother    Alcohol abuse Father    Heart disease Father        heart murmur   Hypertension Father    Deep vein thrombosis Father    Depression Father    Heart Problems Father    Diabetes Father    Breast cancer Maternal Aunt    Bone cancer Maternal Grandfather    Congenital heart disease Maternal Grandmother    Pancreatic cancer Neg Hx    Stomach cancer Neg Hx    Esophageal cancer Neg Hx    Colon cancer Neg Hx     Social History   Socioeconomic History   Marital status: Single    Spouse name: Not on file   Number of children: Not on file   Years of education: Not on file   Highest education level: Not on file  Occupational History   Not on file  Tobacco Use   Smoking status: Every Day    Packs/day: 0.25    Years: 16.00    Total pack years: 4.00    Types: Cigarettes   Smokeless tobacco: Never  Tobacco comments:    Cutting back, plans to quit   Vaping Use   Vaping Use: Some days   Substances: Nicotine  Substance and Sexual Activity   Alcohol use: Not Currently    Comment: occ/social   Drug use: No   Sexual activity: Yes    Birth control/protection: None  Other Topics Concern   Not on file  Social History Narrative   Not on file   Social Determinants of Health   Financial Resource Strain: Not on file  Food Insecurity: Not on file  Transportation Needs: Not on file  Physical Activity: Not on file  Stress: Not on file  Social Connections: Not on file  Intimate Partner Violence: Not on file    Review of Systems  Psychiatric/Behavioral:  Negative for depression and suicidal ideas. The patient is nervous/anxious.   All other systems reviewed and are negative.       Objective    BP 113/74   Pulse 89   Temp 98.1 F (36.7 C) (Oral)   Resp 16   Ht '5\' 2"'$  (1.575 m)   Wt 174 lb (78.9 kg)   SpO2 98%   BMI 31.83 kg/m   Physical Exam Vitals and nursing  note reviewed.  Constitutional:      General: She is not in acute distress. Cardiovascular:     Rate and Rhythm: Normal rate and regular rhythm.  Pulmonary:     Effort: Pulmonary effort is normal.     Breath sounds: Normal breath sounds.  Abdominal:     Palpations: Abdomen is soft.     Tenderness: There is no abdominal tenderness.  Neurological:     General: No focal deficit present.     Mental Status: She is alert and oriented to person, place, and time.         Assessment & Plan:   1. Anxiety state Appears stable. Continue present management.   2. Gastroesophageal reflux disease, unspecified whether esophagitis present Omeprazole prescribed. Dietary and activity options discussed.   3. Tobacco abuse Discussed reduction/cessation    Return in about 4 weeks (around 05/01/2022) for follow up.   Becky Sax, MD

## 2022-04-09 ENCOUNTER — Other Ambulatory Visit: Payer: Self-pay

## 2022-05-12 ENCOUNTER — Encounter: Payer: Commercial Managed Care - HMO | Admitting: Family Medicine

## 2022-05-13 ENCOUNTER — Telehealth (HOSPITAL_COMMUNITY): Payer: Commercial Managed Care - HMO | Admitting: Psychiatry

## 2022-05-15 ENCOUNTER — Encounter: Payer: Self-pay | Admitting: *Deleted

## 2022-05-15 ENCOUNTER — Inpatient Hospital Stay (HOSPITAL_COMMUNITY): Payer: Commercial Managed Care - HMO

## 2022-05-15 ENCOUNTER — Inpatient Hospital Stay (HOSPITAL_COMMUNITY)
Admission: AD | Admit: 2022-05-15 | Discharge: 2022-05-15 | Disposition: A | Discharge status: Home / Self Care | Payer: Commercial Managed Care - HMO | Attending: Family Medicine | Admitting: Family Medicine

## 2022-05-15 DIAGNOSIS — O26899 Other specified pregnancy related conditions, unspecified trimester: Secondary | ICD-10-CM | POA: Diagnosis not present

## 2022-05-15 DIAGNOSIS — R1030 Lower abdominal pain, unspecified: Secondary | ICD-10-CM | POA: Diagnosis not present

## 2022-05-15 DIAGNOSIS — M549 Dorsalgia, unspecified: Secondary | ICD-10-CM | POA: Diagnosis present

## 2022-05-15 DIAGNOSIS — O26891 Other specified pregnancy related conditions, first trimester: Secondary | ICD-10-CM | POA: Insufficient documentation

## 2022-05-15 DIAGNOSIS — Z3A01 Less than 8 weeks gestation of pregnancy: Secondary | ICD-10-CM | POA: Diagnosis not present

## 2022-05-15 DIAGNOSIS — O23592 Infection of other part of genital tract in pregnancy, second trimester: Secondary | ICD-10-CM | POA: Diagnosis not present

## 2022-05-15 DIAGNOSIS — R109 Unspecified abdominal pain: Secondary | ICD-10-CM | POA: Diagnosis not present

## 2022-05-15 DIAGNOSIS — B9689 Other specified bacterial agents as the cause of diseases classified elsewhere: Secondary | ICD-10-CM | POA: Diagnosis not present

## 2022-05-15 LAB — URINALYSIS, ROUTINE W REFLEX MICROSCOPIC
Bilirubin Urine: NEGATIVE
Glucose, UA: NEGATIVE mg/dL
Hgb urine dipstick: NEGATIVE
Ketones, ur: NEGATIVE mg/dL
Leukocytes,Ua: NEGATIVE
Nitrite: NEGATIVE
Protein, ur: NEGATIVE mg/dL
Specific Gravity, Urine: 1.029 (ref 1.005–1.030)
pH: 7 (ref 5.0–8.0)

## 2022-05-15 LAB — CBC
HCT: 32.8 % — ABNORMAL LOW (ref 36.0–46.0)
Hemoglobin: 12 g/dL (ref 12.0–15.0)
MCH: 30.6 pg (ref 26.0–34.0)
MCHC: 36.6 g/dL — ABNORMAL HIGH (ref 30.0–36.0)
MCV: 83.7 fL (ref 80.0–100.0)
Platelets: 301 10*3/uL (ref 150–400)
RBC: 3.92 MIL/uL (ref 3.87–5.11)
RDW: 13.8 % (ref 11.5–15.5)
WBC: 8.2 10*3/uL (ref 4.0–10.5)
nRBC: 0 % (ref 0.0–0.2)

## 2022-05-15 LAB — WET PREP, GENITAL
Sperm: NONE SEEN
Trich, Wet Prep: NONE SEEN
WBC, Wet Prep HPF POC: <10 (ref <10)
Yeast Wet Prep HPF POC: NONE SEEN

## 2022-05-15 LAB — POCT PREGNANCY, URINE: Preg Test, Ur: POSITIVE — AB

## 2022-05-15 LAB — HCG, QUANTITATIVE, PREGNANCY: hCG, Beta Chain, Quant, S: 7820 m[IU]/mL — ABNORMAL HIGH (ref <5)

## 2022-05-15 NOTE — Discharge Instructions (Signed)

## 2022-05-15 NOTE — MAU Note (Signed)
.  Joan Knight is a 36 y.o. at Unknown here in MAU reporting abdominal cramping on LL side of abdomen and also L upper side under L breast. Having some "tingling and numbness" all over her mid to lower left back that radiates down her L leg.  That has been present for a wk.  LMP: 04/03/22 Onset of complaint: one wk.  Pain score: 7 Vitals:   05/15/22 1915 05/15/22 1916  BP:  (!) 111/54  Pulse: 84   Resp: 17   Temp: 98.6 F (37 C)   SpO2: 100%      FHT:n/a Lab orders placed from triage: u/a

## 2022-05-15 NOTE — MAU Provider Note (Signed)
History     CSN: [id number]  Arrival date and time: 05/15/22 1025   Event Date/Time   First Provider Initiated Contact with Patient 05/15/22 2251      Chief Complaint  Patient presents with   Abdominal Pain   Joan Knight is a 36 y.o. G2P0 at 52w0dby Definite LMP of April 03, 2022 who [facility]  She presents today for Abdominal Pain.  She states the pain has been present for about 1-2 weeks and she describes it as "cramps, but not like miscarriage."  She further reports it is "a dull type pain that is all day."  She reports the pain is worse with sleeping and has no relieving factors, but states "it is not that bad when I move around."  She rates a pain a 7/10 and reports she has tylenol, but has not taken it due to fear.  Patient also reports some back pain that is "tingling, numb sensation the middle of my back."    OB History     Gravida  2   Para  0   Term  0   Preterm  0   AB  0   Living  0      SAB  0   IAB  0   Ectopic  0   Multiple  0   Live Births  0           Past Medical History:  Diagnosis Date   Anemia    Anxiety    Depression    [place]    Had history of DVT in the right lower extremity about 5 years ago.  She tells me she was on blood thinner for about a year and then she stopped taking it.     Eczema    GERD (gastroesophageal reflux disease)    Nexplanon in place 02/24/2017   Sickle cell anemia (HCC)    Sickle cell trait (HCC)    Vaginal Pap smear, abnormal    "scraped or cut off" (?LEEP) no problems since    Past Surgical History:  Procedure Laterality Date   LEEP     REMOVAL OF IMPLANON ROD  09/26/2019    Family History  Problem Relation Age of Onset   Asthma Mother    Anxiety disorder Mother    Depression Mother    Bronchitis Mother    Emphysema Mother    COPD Mother    Alcohol abuse Father    Heart disease Father        heart murmur   Hypertension Father    Deep  vein thrombosis Father    Depression Father    Heart Problems Father    Diabetes Father    Breast cancer Maternal Aunt    Bone cancer Maternal Grandfather    Congenital heart disease Maternal Grandmother    Pancreatic cancer Neg Hx    Stomach cancer Neg Hx    Esophageal cancer Neg Hx    Colon cancer Neg Hx     Social History   Tobacco Use   Smoking status: Every Day    Packs/day: 0.25    Years: 16.00    Total pack years: 4.00    Types: Cigarettes   Smokeless tobacco: Never   Tobacco comments:    Cutting back, plans to quit   Vaping Use   Vaping Use: Some days   Substances: Nicotine  Substance Use Topics   Alcohol use: Not  Currently    Comment: occ/social   Drug use: No    Allergies: No Known Allergies  Medications Prior to Admission  Medication Sig Dispense Refill Last Dose   acetaminophen (TYLENOL) 500 MG tablet Take 1,000 mg by mouth every 6 (six) hours as needed for mild pain, fever or headache.      clobetasol cream (TEMOVATE) 0.63 % Apply 1 application. topically 2 (two) times daily. 30 g 1    hydrOXYzine (VISTARIL) 25 MG capsule Take 1 capsule (25 mg total) by mouth at bedtime and may repeat dose one time if needed. 30 capsule 1    omeprazole (PRILOSEC) 20 MG capsule Take 1 capsule (20 mg total) by mouth 2 (two) times daily before a meal for 14 days. 28 capsule 0    omeprazole (PRILOSEC) 40 MG capsule Take 1 capsule (40 mg total) by mouth daily. 30 capsule 1    sertraline (ZOLOFT) 100 MG tablet Take 1 tablet (100 mg total) by mouth daily. 30 tablet 1     Review of Systems  Gastrointestinal:  Positive for abdominal pain and nausea. Negative for vomiting.  Genitourinary:  Negative for difficulty urinating, dysuria, vaginal bleeding and vaginal discharge.  Musculoskeletal:  Positive for back pain ("tingling, numb sensation in middle").  Neurological:  Negative for dizziness, light-headedness and headaches.   Physical Exam   Blood pressure (!) 111/54, pulse 84,  temperature 98.6 F (37 C), resp. rate 17, height '5\' 2"'$  (1.575 m), weight 79.3 kg, last menstrual period 04/03/2022, SpO2 100 %.  Physical Exam Constitutional:      Appearance: She is well-developed.  HENT:     Head: Normocephalic and atraumatic.  Eyes:     Conjunctiva/sclera: Conjunctivae normal.  Cardiovascular:     Rate and Rhythm: Normal rate and regular rhythm.  Pulmonary:     Effort: Pulmonary effort is normal. No respiratory distress.  Musculoskeletal:        General: Normal range of motion.     Cervical back: Normal range of motion.  Skin:    General: Skin is warm and dry.  Neurological:     Mental Status: She is alert and oriented to person, place, and time.  Psychiatric:        Mood and Affect: Mood normal.        Behavior: Behavior normal.     MAU Course  Procedures Results for orders placed or performed during the hospital encounter of 05/15/22 (from the past 24 hour(s))  Urinalysis, Routine w reflex microscopic Urine, Clean Catch     Status: None   Collection Time: 05/15/22  6:55 PM  Result Value Ref Range   Color, Urine YELLOW YELLOW   APPearance CLEAR CLEAR   Specific Gravity, Urine 1.029 1.005 - 1.030   pH 7.0 5.0 - 8.0   Glucose, UA NEGATIVE NEGATIVE mg/dL   Hgb urine dipstick NEGATIVE NEGATIVE   Bilirubin Urine NEGATIVE NEGATIVE   Ketones, ur NEGATIVE NEGATIVE mg/dL   Protein, ur NEGATIVE NEGATIVE mg/dL   Nitrite NEGATIVE NEGATIVE   Leukocytes,Ua NEGATIVE NEGATIVE  Pregnancy, urine POC     Status: Abnormal   Collection Time: 05/15/22  7:13 PM  Result Value Ref Range   Preg Test, Ur POSITIVE (A) NEGATIVE  CBC     Status: Abnormal   Collection Time: 05/15/22  9:30 PM  Result Value Ref Range   WBC 8.2 4.0 - 10.5 K/uL   RBC 3.92 3.87 - 5.11 MIL/uL   Hemoglobin 12.0 12.0 - 15.0 g/dL  HCT 32.8 (L) 36.0 - 46.0 %   MCV 83.7 80.0 - 100.0 fL   MCH 30.6 26.0 - 34.0 pg   MCHC 36.6 (H) 30.0 - 36.0 g/dL   RDW 13.8 11.5 - 15.5 %   Platelets 301 150 - 400  K/uL   nRBC 0.0 0.0 - 0.2 %  hCG, quantitative, pregnancy     Status: Abnormal   Collection Time: 05/15/22  9:30 PM  Result Value Ref Range   hCG, Beta Chain, Quant, S 7,820 (H) <5 mIU/mL  Wet prep, genital     Status: Abnormal   Collection Time: 05/15/22  9:30 PM   Specimen: Vaginal  Result Value Ref Range   Yeast Wet Prep HPF POC NONE SEEN NONE SEEN   Trich, Wet Prep NONE SEEN NONE SEEN   Clue Cells Wet Prep HPF POC PRESENT (A) NONE SEEN   WBC, Wet Prep HPF POC <10 <10   Sperm NONE SEEN    US OB LESS THAN 14 WEEKS WITH OB TRANSVAGINAL  Result Date: 05/15/2022 CLINICAL DATA:  [id number]. Left lower quadrant pain for 1 week. Last menstrual period 04/03/2022. Gestational age by last menstrual period 6 weeks and 0 days. Estimated due date by last menstrual period 01/28/2023. EXAM: OBSTETRIC <14 WK [place] AND TRANSVAGINAL OB US TECHNIQUE: Both transabdominal and transvaginal ultrasound examinations were performed for complete evaluation of the gestation as well as the maternal uterus, adnexal regions, and pelvic cul-de-sac. Transvaginal technique was performed to assess early pregnancy. COMPARISON:  None Available. FINDINGS: Intrauterine gestational sac: Single Yolk sac:  Visualized.  Measuring 2.2 mm Embryo:  Not Visualized. Cardiac Activity: Not Visualized. MSD: 6.9 mm   5 w   3  d Subchorionic hemorrhage:  Possible small. Maternal uterus/adnexae: Bilateral ovaries are unremarkable. Other: Trace free fluid within the pelvis. IMPRESSION: 1. Intrauterine gestational sac and yolk sac. Estimated due date by ultrasound of 5 weeks and 3 days which is concordant with estimated due date by last menstrual period of 6 weeks and 0 days. Recommend follow-up ultrasound in 7-10 days to evaluate for viability of an early pregnancy. 2. Possible small subchorionic hemorrhage. Electronically Signed   By: Iven Finn M.D.   On: 05/15/2022 22:16    MDM Physical Exam Wet Prep and GC/CT Labs: UA, UPT, CBC,  hCG Ultrasound Assessment and Plan  36 year old G2P0 at 6.0 weeks Abdominal Pain  -Labs and [place] ordered by previous provider while patient in triage. -Labs results as above. -Provider to bedside to discuss.  -Informed of findings c/w bacterial vaginosis.  Patient states she is currently taking treatment. -Reviewed c/o back pain and informed that likely not pregnancy related and should continue to monitor.  -Informed that [place] follow up is necessary.  Reviewed findings and reassured that a definitive diagnosis of miscarriage can not be given today, but risks remain.  -Educated on usage of medication in pregnancy. List given.  -Precautions reiterated. -[place] order placed. -Discharged to home in stable condition.    Maryann Conners 05/15/2022, 10:51 PM

## 2022-05-15 NOTE — Progress Notes (Signed)
Written and verbal d/c instructions given and understanding voiced. 

## 2022-05-16 LAB — GC/CHLAMYDIA PROBE AMP (~~LOC~~) NOT AT ARMC
Chlamydia: NEGATIVE
Comment: NEGATIVE
Comment: NORMAL
Neisseria Gonorrhea: NEGATIVE

## 2022-05-19 ENCOUNTER — Telehealth: Payer: Self-pay | Admitting: Women's Health

## 2022-05-26 ENCOUNTER — Telehealth (HOSPITAL_BASED_OUTPATIENT_CLINIC_OR_DEPARTMENT_OTHER): Payer: Commercial Managed Care - HMO | Admitting: Psychiatry

## 2022-05-26 ENCOUNTER — Encounter (HOSPITAL_COMMUNITY): Payer: Self-pay | Admitting: Psychiatry

## 2022-05-26 VITALS — Wt 173.0 lb

## 2022-05-26 DIAGNOSIS — F332 Major depressive disorder, recurrent severe without psychotic features: Secondary | ICD-10-CM

## 2022-05-26 DIAGNOSIS — F419 Anxiety disorder, unspecified: Secondary | ICD-10-CM

## 2022-05-26 NOTE — Progress Notes (Signed)
Virtual Visit via Video Note  I connected with Joan Knight on 05/26/22 at  3:40 PM EDT by a video enabled telemedicine application and verified that I am speaking with the correct person using two identifiers.  Location: Patient: Home Provider: Home Office   I discussed the limitations of evaluation and management by telemedicine and the availability of in person appointments. The patient expressed understanding and agreed to proceed.  History of Present Illness: Patient is evaluated by video session.  She had a cruise trip in second week of June.  She find out that she is pregnant on August 1 and stopped the medication.  She is taking excessive precaution and stopped drinking and smoking marijuana.  She had a one miscarriage before and she is concerned about her current pregnancy.  She is very happy.  She reported things are going very well at work and she got 2 more offers.  She is thinking to take the offer from [facility] because they are in [place].  She has a good relationship with the boyfriend.  She denies any anger, mania, suicidal thoughts or homicidal thoughts.  We have referred her to see a therapist Elnora Morrison however patient has not scheduled appointment yet but like to make appointment as soon as possible.  She has appointment coming up with her OB/GYN on September 14.  She endorsed lately not sleeping well but overall her mood is stable.  She feels lack of sleep could be due to cramping and nausea.  She had a good support from her boyfriend.  She denies any panic attack.  Her appetite is okay.  Her weight is unchanged from the past.  Past Psychiatric History: H/O twice IOP and ER visit for depression and suicidal thoughts. No h/o inpatient.   Recent Results (from the past 2160 hour(s))  Urinalysis, Routine w reflex microscopic Urine, Clean Catch     Status: None   Collection Time: 05/15/22  6:55 PM  Result Value Ref Range   Color, Urine YELLOW YELLOW    APPearance CLEAR CLEAR   Specific Gravity, Urine 1.029 1.005 - 1.030   pH 7.0 5.0 - 8.0   Glucose, UA NEGATIVE NEGATIVE mg/dL   Hgb urine dipstick NEGATIVE NEGATIVE   Bilirubin Urine NEGATIVE NEGATIVE   Ketones, ur NEGATIVE NEGATIVE mg/dL   Protein, ur NEGATIVE NEGATIVE mg/dL   Nitrite NEGATIVE NEGATIVE   Leukocytes,Ua NEGATIVE NEGATIVE    Comment: Performed at [facility] [address] [place], [place] [address]  Pregnancy, urine POC     Status: Abnormal   Collection Time: 05/15/22  7:13 PM  Result Value Ref Range   Preg Test, Ur POSITIVE (A) NEGATIVE    Comment:        THE SENSITIVITY OF THIS METHODOLOGY IS >24 mIU/mL   GC/Chlamydia probe amp ([facility])not at [facility]     Status: None   Collection Time: 05/15/22  9:25 PM  Result Value Ref Range   Neisseria Gonorrhea Negative    Chlamydia Negative    Comment Normal Reference [place] Chlamydia - Negative    Comment      Normal Reference Range Neisseria Gonorrhea - Negative  CBC     Status: Abnormal   Collection Time: 05/15/22  9:30 PM  Result Value Ref Range   WBC 8.2 4.0 - 10.5 K/uL   RBC 3.92 3.87 - 5.11 MIL/uL   Hemoglobin 12.0 12.0 - 15.0 g/dL   HCT 32.8 (L) 36.0 - 46.0 %  MCV 83.7 80.0 - 100.0 fL   MCH 30.6 26.0 - 34.0 pg   MCHC 36.6 (H) 30.0 - 36.0 g/dL   RDW 13.8 11.5 - 15.5 %   Platelets 301 150 - 400 K/uL   nRBC 0.0 0.0 - 0.2 %    Comment: Performed at [facility] [address], [place], [place] [address]  hCG, quantitative, pregnancy     Status: Abnormal   Collection Time: 05/15/22  9:30 PM  Result Value Ref Range   hCG, Beta Chain, Quant, S 7,820 (H) <5 mIU/mL    Comment:          GEST. AGE      CONC.  (mIU/mL)   <=1 WEEK        5 - 50     2 WEEKS       50 - 500     3 WEEKS       100 - 10,000     4 WEEKS     1,000 - 30,000     5 WEEKS     3,500 - 115,000   6-8 WEEKS     12,000 - 270,000    12 WEEKS     15,000 - 220,000        FEMALE AND NON-PREGNANT FEMALE:     LESS THAN 5  mIU/mL Performed at [facility], [place] [address] [place], [place] [address]   Wet prep, genital     Status: Abnormal   Collection Time: 05/15/22  9:30 PM   Specimen: Vaginal  Result Value Ref Range   Yeast Wet Prep HPF POC NONE SEEN NONE SEEN   Trich, Wet Prep NONE SEEN NONE SEEN   Clue Cells Wet Prep HPF POC PRESENT (A) NONE SEEN   WBC, Wet Prep HPF POC <10 <10   Sperm NONE SEEN     Comment: Performed at [facility], [place] [address], [place], [place] [address]     Psychiatric Specialty Exam: Physical Exam  Review of Systems  Weight 173 lb (78.5 kg), last menstrual period 04/03/2022.There is no height or weight on file to calculate BMI.  General Appearance: Casual  Eye Contact:  Good  Speech:  Clear and Coherent and Normal Rate  Volume:  Normal  Mood:  Euthymic  Affect:  Appropriate  Thought Process:  Goal Directed  Orientation:  Full (Time, Place, and Person)  Thought Content:  WDL  Suicidal Thoughts:  No  Homicidal Thoughts:  No  Memory:  Immediate;   Good Recent;   Good Remote;   Fair  Judgement:  Intact  Insight:  Shallow  Psychomotor Activity:  Normal  Concentration:  Concentration: Fair and Attention Span: Fair  Recall:  Good  Fund of Knowledge:  Good  Language:  Good  Akathisia:  No  Handed:  Right  AIMS (if indicated):     Assets:  Communication Skills Desire for Improvement Housing Social Support Transportation  ADL's:  Intact  Cognition:  WNL  Sleep:   fair      Assessment and Plan: Major depressive disorder, recurrent.  Anxiety.  I reviewed blood work results.  Patient is pregnant, find out on August 1 and now she had stopped the medication since then.  She also stopped marijuana but admitted to drinking enough alcohol when she was on a cruise trip earlier.  She had a good support from her husband.  We talk about not to take any medication since she had  1 prior miscarriage.  I encouraged to see a therapist Elnora Morrison and the  information was provided to her earlier.  She promised to call her back.  We discussed safety concern that anytime having active suicidal thoughts or homicidal thought then she need to call 911 or go to local emergency room.  Follow-up in 2 months.  I recommend she discuss with her OB/GYN about her symptoms in detail.  If her OB/GYN agree then we may consider starting a low-dose Zoloft after first trimester.  No new medication given.   Follow Up Instructions:    I discussed the assessment and treatment plan with the patient. The patient was provided an opportunity to ask questions and all were answered. The patient agreed with the plan and demonstrated an understanding of the instructions.   The patient was advised to call back or seek an in-person evaluation if the symptoms worsen or if the condition fails to improve as anticipated.  Collaboration of Care: Other provider involved in patient's care AEB notes are available in epic to review  Patient/Guardian was advised Release of Information must be obtained prior to any record release in order to collaborate their care with an outside provider. Patient/Guardian was advised if they have not already done so to contact the registration department to sign all necessary forms in order for [place] to release information regarding their care.   Consent: Patient/Guardian gives verbal consent for treatment and assignment of benefits for services provided during this visit. Patient/Guardian expressed understanding and agreed to proceed.    I provided 23 minutes of non-face-to-face time during this encounter.   Kathlee Nations, MD

## 2022-05-27 ENCOUNTER — Ambulatory Visit
Admission: RE | Admit: 2022-05-27 | Discharge: 2022-05-27 | Disposition: A | Discharge status: Home / Self Care | Payer: Commercial Managed Care - HMO | Source: Ambulatory Visit

## 2022-05-27 DIAGNOSIS — O26899 Other specified pregnancy related conditions, unspecified trimester: Secondary | ICD-10-CM | POA: Insufficient documentation

## 2022-05-27 DIAGNOSIS — R1030 Lower abdominal pain, unspecified: Secondary | ICD-10-CM | POA: Diagnosis present

## 2022-05-27 DIAGNOSIS — Z3A01 Less than 8 weeks gestation of pregnancy: Secondary | ICD-10-CM | POA: Insufficient documentation

## 2022-05-28 ENCOUNTER — Telehealth: Payer: Self-pay | Admitting: Family Medicine

## 2022-05-28 NOTE — Telephone Encounter (Signed)
Called patient to inform of appointment change because of staffing issues at the office, there was no answer to the phone call so a voicemail was left with the call back number for the office.

## 2022-05-30 ENCOUNTER — Encounter: Payer: Commercial Managed Care - HMO | Admitting: Family Medicine

## 2022-05-31 NOTE — Telephone Encounter (Signed)
Pt called to discuss [place] results. [place] results discussed in-depth. Patient questions asked and answered in-depth.  Clarisa Fling, NP  11:24 AM 05/31/2022

## 2022-06-04 ENCOUNTER — Encounter (HOSPITAL_COMMUNITY): Payer: Self-pay | Admitting: Obstetrics & Gynecology

## 2022-06-04 ENCOUNTER — Inpatient Hospital Stay (HOSPITAL_COMMUNITY)
Admission: AD | Admit: 2022-06-04 | Discharge: 2022-06-04 | Disposition: A | Discharge status: Home / Self Care | Payer: Commercial Managed Care - HMO | Attending: Obstetrics & Gynecology | Admitting: Obstetrics & Gynecology

## 2022-06-04 ENCOUNTER — Telehealth: Payer: Self-pay | Admitting: Family Medicine

## 2022-06-04 ENCOUNTER — Other Ambulatory Visit: Payer: Self-pay

## 2022-06-04 ENCOUNTER — Inpatient Hospital Stay (HOSPITAL_COMMUNITY): Payer: Commercial Managed Care - HMO

## 2022-06-04 DIAGNOSIS — Z87891 Personal history of nicotine dependence: Secondary | ICD-10-CM | POA: Diagnosis not present

## 2022-06-04 DIAGNOSIS — O99611 Diseases of the digestive system complicating pregnancy, first trimester: Secondary | ICD-10-CM | POA: Insufficient documentation

## 2022-06-04 DIAGNOSIS — Z3491 Encounter for supervision of normal pregnancy, unspecified, first trimester: Secondary | ICD-10-CM

## 2022-06-04 DIAGNOSIS — O09521 Supervision of elderly multigravida, first trimester: Secondary | ICD-10-CM | POA: Insufficient documentation

## 2022-06-04 DIAGNOSIS — O26891 Other specified pregnancy related conditions, first trimester: Secondary | ICD-10-CM | POA: Insufficient documentation

## 2022-06-04 DIAGNOSIS — K5900 Constipation, unspecified: Secondary | ICD-10-CM | POA: Diagnosis not present

## 2022-06-04 DIAGNOSIS — O219 Vomiting of pregnancy, unspecified: Secondary | ICD-10-CM | POA: Insufficient documentation

## 2022-06-04 DIAGNOSIS — R109 Unspecified abdominal pain: Secondary | ICD-10-CM | POA: Insufficient documentation

## 2022-06-04 DIAGNOSIS — Z720 Tobacco use: Secondary | ICD-10-CM

## 2022-06-04 DIAGNOSIS — Z3A08 8 weeks gestation of pregnancy: Secondary | ICD-10-CM | POA: Insufficient documentation

## 2022-06-04 LAB — URINALYSIS, ROUTINE W REFLEX MICROSCOPIC
Bilirubin Urine: NEGATIVE
Glucose, UA: NEGATIVE mg/dL
Hgb urine dipstick: NEGATIVE
Ketones, ur: NEGATIVE mg/dL
Leukocytes,Ua: NEGATIVE
Nitrite: NEGATIVE
Protein, ur: NEGATIVE mg/dL
Specific Gravity, Urine: 1.031 — ABNORMAL HIGH (ref 1.005–1.030)
pH: 5 (ref 5.0–8.0)

## 2022-06-04 LAB — CBC
HCT: 34.4 % — ABNORMAL LOW (ref 36.0–46.0)
Hemoglobin: 12.7 g/dL (ref 12.0–15.0)
MCH: 30.8 pg (ref 26.0–34.0)
MCHC: 36.9 g/dL — ABNORMAL HIGH (ref 30.0–36.0)
MCV: 83.3 fL (ref 80.0–100.0)
Platelets: 348 10*3/uL (ref 150–400)
RBC: 4.13 MIL/uL (ref 3.87–5.11)
RDW: 13.1 % (ref 11.5–15.5)
WBC: 7.8 10*3/uL (ref 4.0–10.5)
nRBC: 0 % (ref 0.0–0.2)

## 2022-06-04 LAB — HCG, QUANTITATIVE, PREGNANCY: hCG, Beta Chain, Quant, S: 69503 m[IU]/mL — ABNORMAL HIGH (ref <5)

## 2022-06-04 MED ORDER — NICOTINE 7 MG/24HR TD PT24
7.0000 mg | MEDICATED_PATCH | Freq: Every day | TRANSDERMAL | 3 refills | Status: DC
  Filled 2022-06-04: qty 28, 28d supply, fill #0

## 2022-06-04 NOTE — Telephone Encounter (Signed)
Medication Refill - Medication: Pt had cpe with Dr. Redmond Pulling on 8/25 and states requested refill on her (clobetasol creme .05 mg) for her outbreaks. Do not see in present or history meds, Pt states she is reading off the bottle and it has Dr Dois Davenport name . Pls call to advise. [phone]  Has the patient contacted their pharmacy? No. (Agent: If no, request that the patient contact the pharmacy for the refill. If patient does not wish to contact the pharmacy document the reason why and proceed with request.) Not on file (Agent: If yes, when and what did the pharmacy advise?)NA  Preferred Pharmacy (with phone number or street name):  [facility] [address] [place] [address]  Phone: [phone] Fax: [phone]  Hours: M-F 7:30a-6:00   Has the patient been seen for an appointment in the last year OR does the patient have an upcoming appointment? Yes.    Agent: Please be advised that RX refills may take up to 3 business days. We ask that you follow-up with your pharmacy.

## 2022-06-04 NOTE — Discharge Instructions (Signed)

## 2022-06-04 NOTE — Telephone Encounter (Signed)
clobetasol creme .05 mg) for her outbreaks Patient request

## 2022-06-04 NOTE — MAU Provider Note (Cosign Needed Addendum)
History     CSN: [id number]  Arrival date and time: 06/04/22 1507   Event Date/Time   First Provider Initiated Contact with Patient 06/04/22 1608      Chief Complaint  Patient presents with   Abdominal Pain   Emesis   Nausea   Joan Knight, a  36 y.o. G2P0000 at 28w6dpresents to MAU with complaints of Left sided abdominal pain since finding out she is pregnant. . Patient currently rate 8/10 after Took Tylenol '650mg'$  this morning. Patient states pain is over her whole left side. Denies vaginal bleeding and period like cramping. Describes more like "dull and achy." Last BM was 3 days ago and was normal for here but she states she may go "several days without going." States only has a BM 2x per/week.   She also states "something is off." Patient states her nausea has not been difficult like it has been. States shes been able to keep down food today. Patient states her appetite picked up today. "She just doesn't feel right. Hx of Failed Pregnany at 10 week concern for SAB with this pregnancy.    OB History     Gravida  2   Para  0   Term  0   Preterm  0   AB  0   Living  0      SAB  0   IAB  0   Ectopic  0   Multiple  0   Live Births  0           Past Medical History:  Diagnosis Date   Anemia    Anxiety    Depression    DVT (deep venous thrombosis) (HCC)    Had history of DVT in the right lower extremity about 5 years ago.  She tells me she was on blood thinner for about a year and then she stopped taking it.     Eczema    GERD (gastroesophageal reflux disease)    Nexplanon in place 02/24/2017   Sickle cell anemia (HCC)    Sickle cell trait (HCC)    Vaginal Pap smear, abnormal    "scraped or cut off" (?LEEP) no problems since    Past Surgical History:  Procedure Laterality Date   LEEP     REMOVAL OF IMPLANON ROD  09/26/2019    Family History  Problem Relation Age of Onset   Asthma Mother    Anxiety disorder Mother    Depression Mother     Bronchitis Mother    Emphysema Mother    COPD Mother    Alcohol abuse Father    Heart disease Father        heart murmur   Hypertension Father    Deep vein thrombosis Father    Depression Father    Heart Problems Father    Diabetes Father    Breast cancer Maternal Aunt    Bone cancer Maternal Grandfather    Congenital heart disease Maternal Grandmother    Pancreatic cancer Neg Hx    Stomach cancer Neg Hx    Esophageal cancer Neg Hx    Colon cancer Neg Hx     Social History   Tobacco Use   Smoking status: Former    Packs/day: 0.25    Years: 16.00    Total pack years: 4.00    Types: Cigarettes   Smokeless tobacco: Never   Tobacco comments:    Cutting back, plans to quit   Vaping Use  Vaping Use: Former   Substances: Nicotine  Substance Use Topics   Alcohol use: Not Currently    Comment: occ/social   Drug use: Yes    Types: Marijuana    Comment: Stopped once pregnant    Allergies: No Known Allergies  Medications Prior to Admission  Medication Sig Dispense Refill Last Dose   acetaminophen (TYLENOL) 500 MG tablet Take 1,000 mg by mouth every 6 (six) hours as needed for mild pain, fever or headache.      hydrOXYzine (VISTARIL) 25 MG capsule Take 1 capsule (25 mg total) by mouth at bedtime and may repeat dose one time if needed. (Patient not taking: Reported on 05/26/2022) 30 capsule 1    omeprazole (PRILOSEC) 20 MG capsule Take 1 capsule (20 mg total) by mouth 2 (two) times daily before a meal for 14 days. 28 capsule 0    omeprazole (PRILOSEC) 40 MG capsule Take 1 capsule (40 mg total) by mouth daily. 30 capsule 1    sertraline (ZOLOFT) 100 MG tablet Take 1 tablet (100 mg total) by mouth daily. (Patient not taking: Reported on 05/26/2022) 30 tablet 1     Review of Systems  Constitutional:  Negative for chills, fatigue and fever.  Eyes:  Negative for pain and visual disturbance.  Respiratory:  Negative for apnea, shortness of breath and wheezing.   Cardiovascular:   Negative for chest pain and palpitations.  Gastrointestinal:  Positive for abdominal pain, nausea and vomiting. Negative for constipation and diarrhea.  Genitourinary:  Negative for difficulty urinating, dysuria, pelvic pain, vaginal bleeding, vaginal discharge and vaginal pain.  Musculoskeletal:  Negative for back pain.  Neurological:  Negative for seizures, weakness and headaches.  Psychiatric/Behavioral:  Negative for suicidal ideas. The patient is nervous/anxious.    Physical Exam   Blood pressure 106/60, pulse 79, temperature 98.4 F (36.9 C), temperature source Oral, resp. rate 18, height '5\' 2"'$  (1.575 m), weight 79.2 kg, last menstrual period 04/03/2022, SpO2 99 %.  Physical Exam Vitals and nursing note reviewed.  Constitutional:      General: She is not in acute distress.    Appearance: Normal appearance.  HENT:     Head: Normocephalic.  Cardiovascular:     Rate and Rhythm: Normal rate and regular rhythm.  Pulmonary:     Effort: Pulmonary effort is normal.  Abdominal:     General: Bowel sounds are decreased.     Palpations: Abdomen is soft.     Tenderness: There is abdominal tenderness in the left upper quadrant. There is no right CVA tenderness, left CVA tenderness or guarding.  Musculoskeletal:     Cervical back: Normal range of motion.  Skin:    General: Skin is warm and dry.  Neurological:     Mental Status: She is alert and oriented to person, place, and time.  Psychiatric:        Mood and Affect: Mood is anxious.    MAU Course  Procedures Orders Placed This Encounter  Procedures   US OB LESS THAN 14 WEEKS WITH OB TRANSVAGINAL   Urinalysis, Routine w reflex microscopic Urine, Clean Catch   CBC   hCG, quantitative, pregnancy   Diet NPO time specified   Offered to provide patient with a Enema while in MAU. Patient declines at this time and would prefer to complete at home.   Results for orders placed or performed during the hospital encounter of 06/04/22  (from the past 24 hour(s))  Urinalysis, Routine w reflex microscopic Urine, Clean Catch  Status: Abnormal   Collection Time: 06/04/22  4:05 PM  Result Value Ref Range   Color, Urine YELLOW YELLOW   APPearance HAZY (A) CLEAR   Specific Gravity, Urine 1.031 (H) 1.005 - 1.030   pH 5.0 5.0 - 8.0   Glucose, UA NEGATIVE NEGATIVE mg/dL   Hgb urine dipstick NEGATIVE NEGATIVE   Bilirubin Urine NEGATIVE NEGATIVE   Ketones, ur NEGATIVE NEGATIVE mg/dL   Protein, ur NEGATIVE NEGATIVE mg/dL   Nitrite NEGATIVE NEGATIVE   Leukocytes,Ua NEGATIVE NEGATIVE  CBC     Status: Abnormal   Collection Time: 06/04/22  4:44 PM  Result Value Ref Range   WBC 7.8 4.0 - 10.5 K/uL   RBC 4.13 3.87 - 5.11 MIL/uL   Hemoglobin 12.7 12.0 - 15.0 g/dL   HCT 34.4 (L) 36.0 - 46.0 %   MCV 83.3 80.0 - 100.0 fL   MCH 30.8 26.0 - 34.0 pg   MCHC 36.9 (H) 30.0 - 36.0 g/dL   RDW 13.1 11.5 - 15.5 %   Platelets 348 150 - 400 K/uL   nRBC 0.0 0.0 - 0.2 %   US OB LESS THAN 14 WEEKS WITH OB TRANSVAGINAL  Result Date: 06/04/2022 CLINICAL DATA:  Nausea and vomiting. EXAM: OBSTETRIC <14 WK [place] AND TRANSVAGINAL OB US TECHNIQUE: Both transabdominal and transvaginal ultrasound examinations were performed for complete evaluation of the gestation as well as the maternal uterus, adnexal regions, and pelvic cul-de-sac. Transvaginal technique was performed to assess early pregnancy. COMPARISON:  None Available. FINDINGS: Intrauterine gestational sac: Single Yolk sac:  Visualized. Embryo:  Visualized. Cardiac Activity: Visualized. Heart Rate: 114 bpm CRL:  4.3 mm   6 w   1 d                  [place] EDC: 01/27/2023 Subchorionic hemorrhage:  None visualized. Maternal uterus/adnexae: The bilateral ovaries are within normal limits. No free fluid identified. There is a right-sided intramural fibroid measuring 1.7 x 1.7 x 1.7 cm. IMPRESSION: 1. Single live intrauterine gestation measuring 6 weeks 1 day by crown-rump length. 2. No acute abnormality. 3. Uterine  fibroid. Electronically Signed   By: Ronney Asters M.D.   On: 06/04/2022 17:44    MDM Lab results reviewed and interpreted by me.   BHCG  US revealed a single live IUP measuring [redacted]w[redacted]d Similar results from 9 days ago with minimal change.  Suspicion for early failed pregnancy. Recommended to get a follow-up viability scan in  2 weeks.   LUQ pain is now "manageable" without intervention.   Assessment and Plan   1. Normal IUP (intrauterine pregnancy) on prenatal ultrasound, first trimester   2. [redacted] weeks gestation of pregnancy   3. Tobacco abuse   4. Constipation, unspecified constipation type    - Discussed that this is a living IUP, but concerned for lack of growth over a 9 day span. Recommendation for follow up scan in 2 weeks for viability.  - [facility] on [facility]/14. - Patient quit smoking and Zoloft when she found out she was pregnant. Nicotine patches sent to Outpatient pharmacy.  - Discussed abdominal pain could be linked to Constipation. Instructions provided for Miralax clean out.  - Early pregnancy precautions reviewed.  - Patient discharged home in stable condition and may return to MAU as needed.   SJacquiline Doe MSN CNM  06/04/2022, 4:08 PM

## 2022-06-04 NOTE — MAU Note (Signed)
Joan Knight is a 36 y.o. at 15w6dhere in MAU reporting: abdominal pain and N/V.  Reports abdominal pain is located on left mid abdomen, states pain is a constant ache.  Reports last BM Monday.  Also reporting N/V, last ate solids @ 1100 this morning, no vomiting thus far. LMP: N/A Onset of complaint: 5 weeks (since onset of pregnancy) Pain score: 8 Vitals:   06/04/22 1535  BP: 108/65  Pulse: 78  Resp: 18  Temp: 98.4 F (36.9 C)  SpO2: 100%     FHT:N/A Lab orders placed from triage:   UA

## 2022-06-05 ENCOUNTER — Telehealth: Payer: Commercial Managed Care - HMO

## 2022-06-05 ENCOUNTER — Other Ambulatory Visit: Payer: Self-pay

## 2022-06-08 ENCOUNTER — Encounter (HOSPITAL_COMMUNITY): Payer: Self-pay | Admitting: Obstetrics & Gynecology

## 2022-06-08 ENCOUNTER — Inpatient Hospital Stay (HOSPITAL_COMMUNITY): Payer: Commercial Managed Care - HMO

## 2022-06-08 ENCOUNTER — Telehealth: Payer: Self-pay | Admitting: Obstetrics and Gynecology

## 2022-06-08 ENCOUNTER — Inpatient Hospital Stay (HOSPITAL_COMMUNITY)
Admission: AD | Admit: 2022-06-08 | Discharge: 2022-06-08 | Disposition: A | Discharge status: Home / Self Care | Payer: Commercial Managed Care - HMO | Attending: Obstetrics & Gynecology | Admitting: Obstetrics & Gynecology

## 2022-06-08 ENCOUNTER — Other Ambulatory Visit: Payer: Self-pay

## 2022-06-08 DIAGNOSIS — O039 Complete or unspecified spontaneous abortion without complication: Secondary | ICD-10-CM

## 2022-06-08 DIAGNOSIS — N96 Recurrent pregnancy loss: Secondary | ICD-10-CM | POA: Diagnosis not present

## 2022-06-08 DIAGNOSIS — O021 Missed abortion: Secondary | ICD-10-CM | POA: Insufficient documentation

## 2022-06-08 LAB — URINALYSIS, ROUTINE W REFLEX MICROSCOPIC
Bilirubin Urine: NEGATIVE
Glucose, UA: NEGATIVE mg/dL
Hgb urine dipstick: NEGATIVE
Ketones, ur: NEGATIVE mg/dL
Leukocytes,Ua: NEGATIVE
Nitrite: NEGATIVE
Protein, ur: NEGATIVE mg/dL
Specific Gravity, Urine: 1.025 (ref 1.005–1.030)
pH: 5 (ref 5.0–8.0)

## 2022-06-08 NOTE — MAU Provider Note (Signed)
History     CSN: [id number]  Arrival date and time: 06/08/22 1250   None     Chief Complaint  Patient presents with   Vaginal Bleeding   HPI  [name] is a 36 y.o. female G2P0010 @ 52w3dhere in MAU with complaints of spotting. She reports one episode of dark spotting at 11:00. She has not noticed anything since. She has no pain. She has no heavy bleeding. Hx of miscarriage @ 6 weeks.   OB History     Gravida  2   Para  0   Term  0   Preterm  0   AB  1   Living  0      SAB  1   IAB  0   Ectopic  0   Multiple  0   Live Births  0           Past Medical History:  Diagnosis Date   Anemia    Anxiety    Depression    DVT (deep venous thrombosis) (HCC)    Had history of DVT in the right lower extremity about 5 years ago.  She tells me she was on blood thinner for about a year and then she stopped taking it.     Eczema    GERD (gastroesophageal reflux disease)    Nexplanon in place 02/24/2017   Sickle cell anemia (HCC)    Sickle cell trait (HCC)    Vaginal Pap smear, abnormal    "scraped or cut off" (?LEEP) no problems since    Past Surgical History:  Procedure Laterality Date   LEEP     REMOVAL OF IMPLANON ROD  09/26/2019    Family History  Problem Relation Age of Onset   Asthma Mother    Anxiety disorder Mother    Depression Mother    Bronchitis Mother    Emphysema Mother    COPD Mother    Alcohol abuse Father    Heart disease Father        heart murmur   Hypertension Father    Deep vein thrombosis Father    Depression Father    Heart Problems Father    Diabetes Father    Breast cancer Maternal Aunt    Bone cancer Maternal Grandfather    Congenital heart disease Maternal Grandmother    Pancreatic cancer Neg Hx    Stomach cancer Neg Hx    Esophageal cancer Neg Hx    Colon cancer Neg Hx     Social History   Tobacco Use   Smoking status: Former    Packs/day: 0.25    Years: 16.00    Total pack years: 4.00    Types:  Cigarettes   Smokeless tobacco: Never   Tobacco comments:    Cutting back, plans to quit   Vaping Use   Vaping Use: Former   Substances: Nicotine  Substance Use Topics   Alcohol use: Not Currently    Comment: occ/social   Drug use: Yes    Types: Marijuana    Comment: Stopped once pregnant    Allergies: No Known Allergies  Medications Prior to Admission  Medication Sig Dispense Refill Last Dose   acetaminophen (TYLENOL) 500 MG tablet Take 1,000 mg by mouth every 6 (six) hours as needed for mild pain, fever or headache.      hydrOXYzine (VISTARIL) 25 MG capsule Take 1 capsule (25 mg total) by mouth at bedtime and may repeat dose one time  if needed. (Patient not taking: Reported on 05/26/2022) 30 capsule 1    nicotine (NICODERM CQ - DOSED IN MG/24 HR) 7 mg/24hr patch Place 1 patch (7 mg total) onto the skin daily. 28 patch 3    omeprazole (PRILOSEC) 20 MG capsule Take 1 capsule (20 mg total) by mouth 2 (two) times daily before a meal for 14 days. 28 capsule 0    omeprazole (PRILOSEC) 40 MG capsule Take 1 capsule (40 mg total) by mouth daily. 30 capsule 1    sertraline (ZOLOFT) 100 MG tablet Take 1 tablet (100 mg total) by mouth daily. (Patient not taking: Reported on 05/26/2022) 30 tablet 1    Results for orders placed or performed during the hospital encounter of 06/08/22 (from the past 48 hour(s))  Urinalysis, Routine w reflex microscopic Urine, Clean Catch     Status: Abnormal   Collection Time: 06/08/22  1:15 PM  Result Value Ref Range   Color, Urine YELLOW YELLOW   APPearance HAZY (A) CLEAR   Specific Gravity, Urine 1.025 1.005 - 1.030   pH 5.0 5.0 - 8.0   Glucose, UA NEGATIVE NEGATIVE mg/dL   Hgb urine dipstick NEGATIVE NEGATIVE   Bilirubin Urine NEGATIVE NEGATIVE   Ketones, ur NEGATIVE NEGATIVE mg/dL   Protein, ur NEGATIVE NEGATIVE mg/dL   Nitrite NEGATIVE NEGATIVE   Leukocytes,Ua NEGATIVE NEGATIVE    Comment: Performed at [facility] [address],  [place], [place] [address]    US OB Transvaginal  Result Date: 06/08/2022 CLINICAL DATA:  Vaginal spotting positive pregnancy test. EXAM: OBSTETRIC <14 WK [place] AND TRANSVAGINAL OB US TECHNIQUE: Both transabdominal and transvaginal ultrasound examinations were performed for complete evaluation of the gestation as well as the maternal uterus, adnexal regions, and pelvic cul-de-sac. Transvaginal technique was performed to assess early pregnancy. COMPARISON:  06/04/2022.  05/27/2022. FINDINGS: Intrauterine gestational sac: Single Yolk sac:  Visualize Embryo:  Visualized Cardiac Activity: Not visualized Heart Rate: N/A  bpm MSD:   mm    w     d CRL:  3.6 mm   6 w   0 d                  [place] EDC: 02/01/2023 Crown-rump length was measured at 4.3 mm on 06/04/2022. Crown-rump length was measured at 4.6 mm on 05/27/2022. Subchorionic hemorrhage:  None visualized. Maternal uterus/adnexae: 3 cm posterior fibroid again identified. Right ovary measures 3.8 x 2.4 x 1.9 cm. Left ovary measures 4.2 x 2.2 x 2.2 cm. No adnexal mass. No free fluid in the cul-de-sac. IMPRESSION: 1. Lack of progression of the single intrauterine gestation with fetal crown-rump length measuring consecutively shorter from 4.6 mm on 05/27/2022, to 4.3 mm on 06/04/2022 to 3.6 mm today. While fetal heart activity was visible on the 06/04/2022 exam, there is no fetal heart activity detectable today on 2D grayscale or M-mode Doppler imaging. As such, imaging features are felt to be most consistent with failed pregnancy. Electronically Signed   By: Misty Stanley M.D.   On: 06/08/2022 14:33      Review of Systems  Gastrointestinal:  Negative for abdominal pain.  Genitourinary:  Positive for vaginal bleeding.   Physical Exam   Blood pressure 114/66, temperature 98.2 F (36.8 C), temperature source Oral, resp. rate 18, last menstrual period 04/03/2022, SpO2 99 %.  Physical Exam Constitutional:      General: She is not in acute distress.    Appearance:  Normal appearance. She is not ill-appearing  or toxic-appearing.  Musculoskeletal:        General: Normal range of motion.  Skin:    General: Skin is warm.  Neurological:     Mental Status: She is alert and oriented to person, place, and time.  Psychiatric:        Behavior: Behavior normal.    MAU Course  Procedures None  MDM  Wet prep & GC HIV, CBC, Hcg, ABO US OB transvaginal   Reviewed [place] in detail with the patient. She became very tearful, then crying hysterically and stated she needed to leave.  Will attempt to call the patient later this evening to discuss plan and f/u A positive blood type.   Assessment and Plan   A:  1. SAB (spontaneous abortion)   2. History of recurrent miscarriages      P:  Dc home Needs close follow up Will call patient and attempt to reach.  Lezlie Lye, NP. 06/08/2022 6:32 PM

## 2022-06-08 NOTE — Telephone Encounter (Signed)
Attempted to call patient to discuss plan for SAB. No answer,  no option to leave VM. Will call her back tomorrow.    Lezlie Lye, NP 06/08/2022 7:57 PM

## 2022-06-08 NOTE — MAU Note (Signed)
.  Joan Knight is a 36 y.o. at 2w3dhere in MAU reporting: vag spotting when wiping this morning.  Reports blood was brown.  Denies  Onset of complaint: 9/3 Pain score: 0 Vitals:   06/08/22 1306  BP: 114/66  Resp: 18  Temp: 98.2 F (36.8 C)  SpO2: 99%     Lab orders placed from triage:   ua

## 2022-06-09 ENCOUNTER — Telehealth: Payer: Self-pay | Admitting: Student

## 2022-06-09 ENCOUNTER — Encounter: Payer: Self-pay | Admitting: Student

## 2022-06-09 ENCOUNTER — Other Ambulatory Visit: Payer: Self-pay | Admitting: Student

## 2022-06-09 DIAGNOSIS — O039 Complete or unspecified spontaneous abortion without complication: Secondary | ICD-10-CM | POA: Insufficient documentation

## 2022-06-09 MED ORDER — ONDANSETRON HCL 8 MG PO TABS: 8.0000 mg | ORAL_TABLET | Freq: Three times a day (TID) | ORAL | 0 refills | Status: DC | PRN

## 2022-06-09 MED ORDER — ACETAMINOPHEN-CODEINE 300-30 MG PO TABS: 1.0000 | ORAL_TABLET | ORAL | 0 refills | Status: DC | PRN

## 2022-06-09 MED ORDER — IBUPROFEN 800 MG PO TABS: 800.0000 mg | ORAL_TABLET | Freq: Three times a day (TID) | ORAL | 1 refills | Status: DC | PRN

## 2022-06-09 MED ORDER — PROMETHAZINE HCL 25 MG PO TABS: 25.0000 mg | ORAL_TABLET | Freq: Four times a day (QID) | ORAL | 1 refills | Status: DC | PRN

## 2022-06-09 MED ORDER — MISOPROSTOL 200 MCG PO TABS
ORAL_TABLET | ORAL | 1 refills | Status: DC
Start: 2022-06-09 — End: 2022-06-16

## 2022-06-09 NOTE — Telephone Encounter (Addendum)
Received TC from patient stating that she was seen in MAU and diagnosed with a miscarriage last night but left AMA because she was too upset. She calls back this morning inquiring about plan of care. She denies heavy bleeding, some spotting and some pain but feels overall well. She sounds distressed and anxious.   Reviewed blood work, including CBC and bhcg, [place] results, and provider note. In sum, patient has been diagnosed with a miscarriage and needs a plan of care. Reviewed options with patient: D and C, expectnant management or cytotec. Patient has had a miscarriage before and remember what it feels like, says she managed it at home. Patient states that she has booked a beach trip for next weekend and is leaving on Thursday or Friday; she would like to know if she can have D and C before then. I told her I could not guarantee that, and suggested that she reconsider her plans. Patient would like to try cytotec now and have a refill. I explained how to take dose, warning signs, signs of bleeding. Information also sent to her by My Chart. Meds for nausea, pain given. She stats that she understands and will be near a hospital if any complication.   Will cancel [place] tomorrow; patient can keep follow up appt on 06/19/2022 as scheduled.   Maye Hides

## 2022-06-10 ENCOUNTER — Other Ambulatory Visit: Payer: Commercial Managed Care - HMO

## 2022-06-11 ENCOUNTER — Other Ambulatory Visit: Payer: Self-pay | Admitting: Family Medicine

## 2022-06-11 ENCOUNTER — Other Ambulatory Visit: Payer: Self-pay

## 2022-06-11 ENCOUNTER — Telehealth: Payer: Commercial Managed Care - HMO

## 2022-06-12 ENCOUNTER — Telehealth: Payer: Commercial Managed Care - HMO

## 2022-06-16 ENCOUNTER — Encounter (HOSPITAL_COMMUNITY): Payer: Self-pay | Admitting: Family Medicine

## 2022-06-16 ENCOUNTER — Inpatient Hospital Stay (HOSPITAL_COMMUNITY): Payer: Commercial Managed Care - HMO

## 2022-06-16 ENCOUNTER — Inpatient Hospital Stay (HOSPITAL_COMMUNITY)
Admission: AD | Admit: 2022-06-16 | Discharge: 2022-06-16 | Disposition: A | Discharge status: Home / Self Care | Payer: Commercial Managed Care - HMO | Attending: Family Medicine | Admitting: Family Medicine

## 2022-06-16 DIAGNOSIS — O039 Complete or unspecified spontaneous abortion without complication: Secondary | ICD-10-CM

## 2022-06-16 DIAGNOSIS — O036 Delayed or excessive hemorrhage following complete or unspecified spontaneous abortion: Secondary | ICD-10-CM | POA: Insufficient documentation

## 2022-06-16 DIAGNOSIS — Z6790 Unspecified blood type, Rh positive: Secondary | ICD-10-CM

## 2022-06-16 LAB — TYPE AND SCREEN
ABO/RH(D): A POS
Antibody Screen: NEGATIVE

## 2022-06-16 LAB — CBC
HCT: 34.1 % — ABNORMAL LOW (ref 36.0–46.0)
Hemoglobin: 12.3 g/dL (ref 12.0–15.0)
MCH: 30.4 pg (ref 26.0–34.0)
MCHC: 36.1 g/dL — ABNORMAL HIGH (ref 30.0–36.0)
MCV: 84.2 fL (ref 80.0–100.0)
Platelets: 317 10*3/uL (ref 150–400)
RBC: 4.05 MIL/uL (ref 3.87–5.11)
RDW: 13 % (ref 11.5–15.5)
WBC: 10.5 10*3/uL (ref 4.0–10.5)
nRBC: 0 % (ref 0.0–0.2)

## 2022-06-16 LAB — COMPREHENSIVE METABOLIC PANEL
ALT: 15 U/L (ref 0–44)
AST: 19 U/L (ref 15–41)
Albumin: 3.8 g/dL (ref 3.5–5.0)
Alkaline Phosphatase: 37 U/L — ABNORMAL LOW (ref 38–126)
Anion gap: 8 (ref 5–15)
BUN: 10 mg/dL (ref 6–20)
CO2: 21 mmol/L — ABNORMAL LOW (ref 22–32)
Calcium: 9.2 mg/dL (ref 8.9–10.3)
Chloride: 108 mmol/L (ref 98–111)
Creatinine, Ser: 0.85 mg/dL (ref 0.44–1.00)
GFR, Estimated: >60 mL/min (ref >60)
Glucose, Bld: 101 mg/dL — ABNORMAL HIGH (ref 70–99)
Potassium: 3.5 mmol/L (ref 3.5–5.1)
Sodium: 137 mmol/L (ref 135–145)
Total Bilirubin: 0.3 mg/dL (ref 0.3–1.2)
Total Protein: 6.6 g/dL (ref 6.5–8.1)

## 2022-06-16 LAB — HCG, QUANTITATIVE, PREGNANCY: hCG, Beta Chain, Quant, S: 15847 m[IU]/mL — ABNORMAL HIGH (ref <5)

## 2022-06-16 MED ORDER — OXYCODONE-ACETAMINOPHEN 5-325 MG PO TABS
2.0000 | ORAL_TABLET | Freq: Once | ORAL | Status: AC
  Administered 2022-06-16: 2 via ORAL
  Filled 2022-06-16: qty 2

## 2022-06-16 NOTE — MAU Note (Signed)
Joan Knight is a 36 y.o. at 17w6dhere in MAU reporting: had a MAB, was given CYtotec on Monday .  Was passing blood clots and having pain and ctxs through to Tues morning.  Then the pain stopped. Has just been spotting all wk.   Started bleeding and having bad cramping- "it's a 20".  Has the shakes, feels like she is going to throw up, is hot.  Has been wearing period panties, on her 4th pair today.  Took the pain medication she gave her and it 's not working at all  Onset of complaint: this morning Pain score: 20 Vitals:   06/16/22 1757  BP: 123/82  Pulse: 82  Resp: 20  Temp: 98.3 F (36.8 C)  SpO2: 100%     Lab orders placed from triage:

## 2022-06-16 NOTE — MAU Provider Note (Signed)
History     CSN: [id number]  Arrival date and time: 06/16/22 1729   Event Date/Time   First Provider Initiated Contact with Patient 06/16/22 1759      Chief Complaint  Patient presents with   Vaginal Bleeding   Abdominal Pain   HPI Joan Knight is a 36 y.o. G2P0010 patient who presents to MAU with chief complaints of heavy vaginal bleeding. She is s/p diagnoses of SAB in MAU on 06/08/2022. Patient states she took Cytotec at home on Monday 06/09/2022. She started bleeding Monday night. Her bleeding lightened significantly Tuesday. She recovered well and was able to vacation. During her drive home today, she experienced acute onset abdominal pain x 4 hours. Pain score "20/10" and has not improved since onset. She attempted management with PO Tylenol #3 but did not experience relief.  Patient also reports acute onset heavy vaginal bleeding which started on conjunction with her abdominal pain. She reports saturating her menstrual panties in about one hour. She states she could feel it running down her legs but does not think it stained through her clothing. She denies SOB, chest pain, activity intolerance, weakness, syncope.  Patient has scheduled miscarriage followup at [facility] on 09/14.   OB History     Gravida  2   Para  0   Term  0   Preterm  0   AB  1   Living  0      SAB  1   IAB  0   Ectopic  0   Multiple  0   Live Births  0           Past Medical History:  Diagnosis Date   Anemia    Anxiety    Depression    DVT (deep venous thrombosis) (HCC)    Had history of DVT in the right lower extremity about 5 years ago.  She tells me she was on blood thinner for about a year and then she stopped taking it.     Eczema    GERD (gastroesophageal reflux disease)    Nexplanon in place 02/24/2017   Sickle cell anemia (HCC)    Sickle cell trait (HCC)    Vaginal Pap smear, abnormal    "scraped or cut off" (?LEEP) no problems since    Past Surgical History:   Procedure Laterality Date   LEEP     REMOVAL OF IMPLANON ROD  09/26/2019    Family History  Problem Relation Age of Onset   Asthma Mother    Anxiety disorder Mother    Depression Mother    Bronchitis Mother    Emphysema Mother    COPD Mother    Alcohol abuse Father    Heart disease Father        heart murmur   Hypertension Father    Deep vein thrombosis Father    Depression Father    Heart Problems Father    Diabetes Father    Breast cancer Maternal Aunt    Bone cancer Maternal Grandfather    Congenital heart disease Maternal Grandmother    Pancreatic cancer Neg Hx    Stomach cancer Neg Hx    Esophageal cancer Neg Hx    Colon cancer Neg Hx     Social History   Tobacco Use   Smoking status: Former    Packs/day: 0.25    Years: 16.00    Total pack years: 4.00    Types: Cigarettes   Smokeless tobacco: Never  Tobacco comments:    Cutting back, plans to quit   Vaping Use   Vaping Use: Former   Substances: Nicotine  Substance Use Topics   Alcohol use: Not Currently    Comment: occ/social   Drug use: Yes    Types: Marijuana    Comment: Stopped once pregnant    Allergies: No Known Allergies  Medications Prior to Admission  Medication Sig Dispense Refill Last Dose   acetaminophen (TYLENOL) 500 MG tablet Take 1,000 mg by mouth every 6 (six) hours as needed for mild pain, fever or headache.      acetaminophen-codeine (TYLENOL #3) 300-30 MG tablet Take 1 tablet by mouth every 4 (four) hours as needed for moderate pain. 15 tablet 0    hydrOXYzine (VISTARIL) 25 MG capsule Take 1 capsule (25 mg total) by mouth at bedtime and may repeat dose one time if needed. (Patient not taking: Reported on 05/26/2022) 30 capsule 1    ibuprofen (ADVIL) 800 MG tablet Take 1 tablet (800 mg total) by mouth every 8 (eight) hours as needed. 30 tablet 1    misoprostol (CYTOTEC) 200 MCG tablet Place two tablets between cheek and gum on each side. Take refill dose in 72 hours if bleeding does  not begin. 4 tablet 1    nicotine (NICODERM CQ - DOSED IN MG/24 HR) 7 mg/24hr patch Place 1 patch (7 mg total) onto the skin daily. 28 patch 3    ondansetron (ZOFRAN) 8 MG tablet Take 1 tablet (8 mg total) by mouth every 8 (eight) hours as needed for nausea or vomiting. 20 tablet 0    promethazine (PHENERGAN) 25 MG tablet Take 1 tablet (25 mg total) by mouth every 6 (six) hours as needed for nausea or vomiting. 30 tablet 1    sertraline (ZOLOFT) 100 MG tablet Take 1 tablet (100 mg total) by mouth daily. (Patient not taking: Reported on 05/26/2022) 30 tablet 1     Review of Systems  Gastrointestinal:  Positive for abdominal pain.  Genitourinary:  Positive for vaginal bleeding.  All other systems reviewed and are negative.  Physical Exam   Blood pressure 123/82, pulse 82, temperature 98.3 F (36.8 C), temperature source Oral, resp. rate 20, height '5\' 2"'$  (1.575 m), weight 80.4 kg, last menstrual period 04/03/2022, SpO2 100 %.  Physical Exam Vitals and nursing note reviewed. Exam conducted with a chaperone present.  Constitutional:      General: She is in acute distress.     Appearance: She is well-developed.  Cardiovascular:     Rate and Rhythm: Normal rate and regular rhythm.     Heart sounds: Normal heart sounds.  Pulmonary:     Effort: Pulmonary effort is normal.  Abdominal:     General: Bowel sounds are normal.     Tenderness: There is no abdominal tenderness.  Genitourinary:    Comments: Pelvic exam: External genitalia normal, vaginal walls pink and well rugated, very large clot protruding from cervical os into vagina. Removed with gentle manipulation of speculum and fox swab x 1. 2 additional fox swabs used following removal of clot. Cervix visually closed, bleeding scant, pain improved.   Skin:    General: Skin is warm.     Capillary Refill: Capillary refill takes less than 2 seconds.  Neurological:     Mental Status: She is alert and oriented to person, place, and time.   Psychiatric:        Mood and Affect: Mood normal.  Behavior: Behavior normal.     MAU Course  Procedures  MDM Orders Placed This Encounter  Procedures   US OB Transvaginal   CBC   Comprehensive metabolic panel   hCG, quantitative, pregnancy   Type and screen [place]   Discharge patient      06/16/2022    8:08 PM 06/16/2022    5:57 PM  Vitals with BMI  Height  '5\' 2"'$   Weight  177 lbs 3 oz  BMI  76.2  Systolic 831 517  Diastolic 54 82  Pulse 85 82    Results for orders placed or performed during the hospital encounter of 06/16/22 (from the past 48 hour(s))  Type and screen [place]     Status: None   Collection Time: 06/16/22  6:10 PM  Result Value Ref Range   ABO/RH(D) A POS    Antibody Screen NEG    Sample Expiration      06/19/2022,2359 Performed at [facility], [place] [address] [place], [place] [address]   CBC     Status: Abnormal   Collection Time: 06/16/22  6:13 PM  Result Value Ref Range   WBC 10.5 4.0 - 10.5 K/uL   RBC 4.05 3.87 - 5.11 MIL/uL   Hemoglobin 12.3 12.0 - 15.0 g/dL   HCT 34.1 (L) 36.0 - 46.0 %   MCV 84.2 80.0 - 100.0 fL   MCH 30.4 26.0 - 34.0 pg   MCHC 36.1 (H) 30.0 - 36.0 g/dL   RDW 13.0 11.5 - 15.5 %   Platelets 317 150 - 400 K/uL   nRBC 0.0 0.0 - 0.2 %    Comment: Performed at [facility], [place] [address] [place], [place] [address]  Comprehensive metabolic panel     Status: Abnormal   Collection Time: 06/16/22  6:13 PM  Result Value Ref Range   Sodium 137 135 - 145 mmol/L   Potassium 3.5 3.5 - 5.1 mmol/L   Chloride 108 98 - 111 mmol/L   CO2 21 (L) 22 - 32 mmol/L   Glucose, Bld 101 (H) 70 - 99 mg/dL    Comment: Glucose reference range applies only to samples taken after fasting for at least 8 hours.   BUN 10 6 - 20 mg/dL   Creatinine, Ser 0.85 0.44 - 1.00 mg/dL   Calcium 9.2 8.9 - 10.3 mg/dL   Total Protein 6.6 6.5 - 8.1 g/dL   Albumin 3.8 3.5 - 5.0 g/dL   AST 19 15 - 41  U/L   ALT 15 0 - 44 U/L   Alkaline Phosphatase 37 (L) 38 - 126 U/L   Total Bilirubin 0.3 0.3 - 1.2 mg/dL   GFR, Estimated >60 >60 mL/min    Comment: (NOTE) Calculated using the CKD-EPI Creatinine Equation (2021)    Anion gap 8 5 - 15    Comment: Performed at [facility] [address], [place], [place] [address]  hCG, quantitative, pregnancy     Status: Abnormal   Collection Time: 06/16/22  6:13 PM  Result Value Ref Range   hCG, Beta Chain, Quant, S 15,847 (H) <5 mIU/mL    Comment:          GEST. AGE      CONC.  (mIU/mL)   <=1 WEEK        5 - 50     2 WEEKS       50 - 500  3 WEEKS       100 - 10,000     4 WEEKS     1,000 - 30,000     5 WEEKS     3,500 - 115,000   6-8 WEEKS     12,000 - 270,000    12 WEEKS     15,000 - 220,000        FEMALE AND NON-PREGNANT FEMALE:     LESS THAN 5 mIU/mL Performed at [facility], [place] [address], [place], [place] [address]    US OB Transvaginal  Result Date: 06/16/2022 CLINICAL DATA:  Cytotec 9/4; increased vaginal bleeding today EXAM: TRANSVAGINAL OB ULTRASOUND TECHNIQUE: Transvaginal ultrasound was performed for complete evaluation of the gestation as well as the maternal uterus, adnexal regions, and pelvic cul-de-sac. COMPARISON:  Ultrasound 06/14/2022 FINDINGS: Intrauterine gestational sac: Not visualized Yolk sac:  Not Visualized. Embryo:  Not Visualized. Cardiac Activity: Not Visualized. Heart Rate: N/a bpm MSD:   mm    w     d CRL:     mm    w  d                  [place] EDC: Subchorionic hemorrhage:  None visualized. Maternal uterus/adnexae: Normal appearance of the bilateral ovaries. Endometrium mildly heterogenous and measures 20 mm without internal vascularity. No free fluid IMPRESSION: The previously seen intrauterine gestational sac is no longer visualized. Thickened heterogenous endometrium without definite retained products of conception. Electronically Signed   By: Placido Sou M.D.   On: 06/16/2022 19:47    Meds ordered  this encounter  Medications   oxyCODONE-acetaminophen (PERCOCET/ROXICET) 5-325 MG per tablet 2 tablet   Assessment and Plan  --36 y.o. G2P0010 s/p confirmed SAB --Quant now 15,847 s/p Cytotec --Hgb 12.3 --Blood type A POS --No recurrence of pain following removal of clot --Scant bleeding, VSS --Patient given work note for tomorrow 06/17/22 --Discharge home in stable condition  F/U: --Patient advised to keep office appointment with [facility] 06/19/2022 for SAB followup  Mallie Snooks, [place], MSN, CNM Certified Nurse Midwife, Product/process development scientist for [employer], [place]

## 2022-06-18 LAB — SURGICAL PATHOLOGY

## 2022-06-19 ENCOUNTER — Encounter: Payer: Self-pay | Admitting: Student

## 2022-06-19 ENCOUNTER — Other Ambulatory Visit: Payer: Self-pay

## 2022-06-19 ENCOUNTER — Ambulatory Visit (INDEPENDENT_AMBULATORY_CARE_PROVIDER_SITE_OTHER): Payer: Commercial Managed Care - HMO | Admitting: Student

## 2022-06-19 DIAGNOSIS — O039 Complete or unspecified spontaneous abortion without complication: Secondary | ICD-10-CM | POA: Diagnosis not present

## 2022-06-19 MED ORDER — MISOPROSTOL 200 MCG PO TABS
ORAL_TABLET | ORAL | 0 refills | Status: DC
  Filled 2022-06-19: qty 4, 2d supply, fill #0

## 2022-06-19 MED ORDER — OXYCODONE-ACETAMINOPHEN 5-325 MG PO TABS
1.0000 | ORAL_TABLET | Freq: Four times a day (QID) | ORAL | 0 refills | Status: AC | PRN
  Filled 2022-06-19: qty 20, 5d supply, fill #0

## 2022-06-19 NOTE — Progress Notes (Unsigned)
Pt reports back pain and pressure 10/10 today with heavy dark/bright red bleeding and clots.  BP today 110/64, pulse 103 Weight 177lb

## 2022-06-19 NOTE — Progress Notes (Unsigned)
  History:  [name] is a 36 y.o. G2P0020 who presents to clinic today for follow up from SAB. She took cytotec on    The following portions of the patient's history were reviewed and updated as appropriate: allergies, current medications, family history, past medical history, social history, past surgical history and problem list.  Review of Systems:  ROS    Objective:  Physical Exam LMP 04/03/2022 (Approximate)   Breastfeeding Unknown  Physical Exam    Labs and Imaging No results found for this or any previous visit (from the past 24 hour(s)).  No results found.  Health Maintenance Due  Topic Date Due   COVID-19 Vaccine (1) Never done   Hepatitis C Screening  Never done   INFLUENZA VACCINE  05/06/2022    Labs, imaging and previous visits in Epic and Care Everywhere reviewed  Assessment & Plan:  There are no diagnoses linked to this encounter.  -needs miscarriage labs and schedule for pap smear  Approximately *** minutes of total time was spent with this patient on ***  Starr Lake, [place] 06/19/2022 3:45 PM

## 2022-06-20 NOTE — Progress Notes (Signed)
History:  [name] is a 36 y.o. G2P0020 who presents to clinic today for miscarraige. After she took cytotec on the 4th she had heavier bleeding which improved and she was able to go on her vacation the 8th and return on the 11th. She reported heavier bleeding on the 11th so she came to MAU.  She states that MAU told her the miscarriage was complete. Since then,  she reports occasional VB that is at times dark red and at times bright red; she is going through 2-3 pads a day. She denies HA, SOB, weakness, dizziness. She reports that she has been very sad and upset since miscarriage and that her pain at times is unbearable. Sometimes it is a 10/10 at night. SHe has been taking ibuprofen during the day and Tylenol#3 at night. She is distressed.   Patient would also like lab work for miscarriage, she is concerned that she had a miscarriage this year and last year and she wants to know what is wrong with her and why this keeps happening.   The following portions of the patient's history were reviewed and updated as appropriate: allergies, current medications, family history, past medical history, social history, past surgical history and problem list.  Review of Systems:  Review of Systems  Constitutional: Negative.   HENT: Negative.    Respiratory: Negative.    Cardiovascular: Negative.   Gastrointestinal:  Positive for abdominal pain.  Musculoskeletal: Negative.   Skin: Negative.   Neurological: Negative.   Psychiatric/Behavioral: Negative.    Positive for vaginal bleeding    Objective:  Physical Exam LMP 04/03/2022 (Approximate)   Breastfeeding Unknown  Physical Exam Cardiovascular:     Rate and Rhythm: Normal rate.  Pulmonary:     Effort: Pulmonary effort is normal.  Musculoskeletal:        General: Normal range of motion.  Skin:    General: Skin is warm.     Capillary Refill: Capillary refill takes less than 2 seconds.  Neurological:     Mental Status: She is alert.   Psychiatric:     Comments: She appears distressed       Labs and Imaging No results found for this or any previous visit (from the past 24 hour(s)).  No results found.  Health Maintenance Due  Topic Date Due   COVID-19 Vaccine (1) Never done   Hepatitis C Screening  Never done   INFLUENZA VACCINE  05/06/2022    Labs, imaging and previous visits in Epic and Care Everywhere reviewed  Assessment & Plan:  1. Miscarriage -reviewed MAU course and [place], which confirmed SAB with no GS on [place] and drop in bHCG. Endometrial lining was 20 mm and CBC that day (3 days ago) was 12.3. CBC not repeated today as patient denies any signs of anemia or acute blood loss.  -Patient discussed with Dr. Kennon Rounds, who is comfortable with patient having 2nd dose of cytotec if she would like-RX sent -Patient states she needs a different type of pain medication; reports that Tylenol3 is not helping cramps and that her pain is a 10/10; will send short course of percocet.  - Ambulatory referral to [facility]  -support and reassurance given regarding miscarriages, discussed labs with patient, will draw APS, TSH, Hgb and anticoags panel - TSH + free T4 - HgB A1c - Lupus anticoagulant panel( [facility]) - Factor 5 leiden - Protein C activity - Protein C, total - Protein S activity - Protein S,  total  -Patient given referral to [facility] clinician as well.   Approximately 30 minutes of total time was spent with this patient on care and counseling, reviewing labs, [place] results and ordering tests.   Starr Lake, [place] 06/20/2022 3:46 PM

## 2022-06-25 LAB — HEMOGLOBIN A1C
Est. average glucose Bld gHb Est-mCnc: 108 mg/dL
Hgb A1c MFr Bld: 5.4 % (ref 4.8–5.6)

## 2022-06-25 LAB — LUPUS ANTICOAGULANT PANEL
Dilute Viper Venom Time: 27.4 s (ref 0.0–47.0)
PTT Lupus Anticoagulant: 26.9 s (ref 0.0–43.5)

## 2022-06-25 LAB — FACTOR 5 LEIDEN

## 2022-06-25 LAB — PROTEIN C ACTIVITY: Protein C Activity: 103 % (ref 73–180)

## 2022-06-25 LAB — PROTEIN S ACTIVITY: Protein S Activity: 100 % (ref 63–140)

## 2022-06-25 LAB — TSH+FREE T4
Free T4: 1.18 ng/dL (ref 0.82–1.77)
TSH: 0.66 u[IU]/mL (ref 0.450–4.500)

## 2022-06-25 LAB — PROTEIN C, TOTAL: Protein C Antigen: 83 % (ref 60–150)

## 2022-06-25 LAB — PROTEIN S, TOTAL: Protein S Ag, Total: 75 % (ref 60–150)

## 2022-06-30 ENCOUNTER — Other Ambulatory Visit: Payer: Self-pay

## 2022-06-30 ENCOUNTER — Other Ambulatory Visit: Payer: Self-pay | Admitting: Family Medicine

## 2022-06-30 MED ORDER — CLOBETASOL PROPIONATE 0.05 % EX CREA
1.0000 | TOPICAL_CREAM | Freq: Two times a day (BID) | CUTANEOUS | 0 refills | Status: DC
  Filled 2022-06-30: qty 30, 30d supply, fill #0

## 2022-07-01 ENCOUNTER — Other Ambulatory Visit: Payer: Self-pay

## 2022-07-02 NOTE — BH Specialist Note (Signed)
Integrated Behavioral Health via Telemedicine Visit  07/16/2022 [name] [id number]  Number of [place] Clinician visits: 1- Initial Visit  Session Start time: 2376   Session End time: 1522  Total time in minutes: 27   Referring Provider: Maye Hides, CNM Patient/Family location: Home [facility] Provider location: [facility] [place] [facility]  All persons participating in visit: [name] and [place]   Types of Service: Individual psychotherapy and Video visit  I connected with [name] and/or [place]  via  Telephone or Geologist, engineering  (Video is Tree surgeon) and verified that I am speaking with the correct person using two identifiers. Discussed confidentiality: Yes   I discussed the limitations of telemedicine and the availability of in person appointments.  Discussed there is a possibility of technology failure and discussed alternative modes of communication if that failure occurs.  I discussed that engaging in this telemedicine visit, they consent to the provision of behavioral healthcare and the services will be billed under their insurance.  Patient and/or legal guardian expressed understanding and consented to Telemedicine visit: Yes   Presenting Concerns: Patient and/or family reports the following symptoms/concerns: Grieving loss of pregnancy.  Patient and/or Family's Strengths/Protective Factors: Social connections, Concrete supports in place (healthy food, safe environments, etc.), Sense of purpose, and Physical Health (exercise, healthy diet, medication compliance, etc.)  Goals Addressed: Patient will:    Demonstrate ability to: Begin healthy grieving over loss  Progress towards Goals: Revised  Interventions: Interventions utilized:  Supportive Counseling and Link to [employer] Standardized Assessments  completed: Not Needed  Patient and/or Family Response: Patient agrees with treatment plan.   Assessment: Patient currently experiencing Grief.   Patient may benefit from continued therapeutic intervention.  Plan: Follow up with behavioral health clinician on : Two weeks Behavioral recommendations:  -Continue allowing time and space to grieve loss --Continue prioritizing healthy self-care (regular meals, adequate rest; allowing practical help from supportive friends and family)  -Consider pregnancy loss support group as discussed Referral(s): [place] (In Clinic) and [employer]:  pregnancy loss  I discussed the assessment and treatment plan with the patient and/or parent/guardian. They were provided an opportunity to ask questions and all were answered. They agreed with the plan and demonstrated an understanding of the instructions.   They were advised to call back or seek an in-person evaluation if the symptoms worsen or if the condition fails to improve as anticipated.  Caroleen Hamman Ramello Cordial, LCSW     06/19/2022    3:59 PM 04/03/2022    4:00 PM 01/20/2022    2:47 PM 09/09/2021   11:31 AM 05/22/2021   10:50 AM  Depression screen PHQ 2/9  Decreased Interest 2 0 '2 3 3  '$ Down, Depressed, Hopeless 3 0 '2 3 3  '$ PHQ - 2 Score 5 0 '4 6 6  '$ Altered sleeping 3 0 '3 3 3  '$ Tired, decreased energy 3 0 '2 3 3  '$ Change in appetite 2 0 '1 3 3  '$ Feeling bad or failure about yourself  3 0 '2 3 3  '$ Trouble concentrating 2 0 '2 3 3  '$ Moving slowly or fidgety/restless 3 0 '3 3 3  '$ Suicidal thoughts 2 0 0 0 3  PHQ-9 Score 23 0 '17 24 27  '$ Difficult doing work/chores  Not difficult at all  Extremely dIfficult Extremely dIfficult      06/19/2022    3:59  PM 01/20/2022    2:47 PM 02/15/2021    9:09 AM 03/08/2020    2:30 PM  GAD 7 : Generalized Anxiety Score  Nervous, Anxious, on Edge '3 3 3 2  '$ Control/stop worrying '3 3 3 2  '$ Worry too much - different things '3 3 3 3  '$ Trouble relaxing  '3 3 3 2  '$ Restless '3 2 3 2  '$ Easily annoyed or irritable '3 3 3 3  '$ Afraid - awful might happen '3 1 1 '$ 0  Total GAD 7 Score '21 18 19 '$ 14

## 2022-07-16 ENCOUNTER — Ambulatory Visit: Payer: Commercial Managed Care - HMO | Admitting: Clinical

## 2022-07-16 DIAGNOSIS — F4321 Adjustment disorder with depressed mood: Secondary | ICD-10-CM

## 2022-07-16 NOTE — Patient Instructions (Signed)
[facility] [place], [place] [address] [phone] (main office) [phone] ([place] office)  Pregnancy Loss Support Groups [email or web address]

## 2022-07-17 NOTE — BH Specialist Note (Signed)
Integrated Behavioral Health via Telemedicine Visit  07/17/2022 SHARLINE LEHANE [id number]  Number of [place] Clinician visits: 1- Initial Visit  Session Start time: 6073   Session End time: 1522  Total time in minutes: 27   Referring Provider: Maye Hides, CNM Patient/Family location: Home [facility] Provider location: [facility] [place] [facility]  All persons participating in visit: Patient Ethelyn Cerniglia and [place]   Types of Service: Individual psychotherapy and Video visit  I connected with [name] and/or [place]  via  Telephone or Geologist, engineering  (Video is Tree surgeon) and verified that I am speaking with the correct person using two identifiers. Discussed confidentiality: Yes   I discussed the limitations of telemedicine and the availability of in person appointments.  Discussed there is a possibility of technology failure and discussed alternative modes of communication if that failure occurs.  I discussed that engaging in this telemedicine visit, they consent to the provision of behavioral healthcare and the services will be billed under their insurance.  Patient and/or legal guardian expressed understanding and consented to Telemedicine visit: Yes   Presenting Concerns: Patient and/or family reports the following symptoms/concerns: Headaches and nausea, attributes to "not eating today and not enough sleep"; taking BH medication as prescribed by psychiatry and will f/u in six weeks for medication management.  Duration of problem: Ongoing with recent increase in symptoms after loss; Severity of problem: severe  Patient and/or Family's Strengths/Protective Factors: Social connections, Concrete supports in place (healthy food, safe environments, etc.), Sense of purpose, and Physical Health (exercise, healthy diet, medication compliance,  etc.)  Goals Addressed: Patient will:  Reduce symptoms of: anxiety, depression, and stress   Increase knowledge and/or ability of:  healthy habits  Demonstrate ability to: Increase motivation to adhere to plan of care  Progress towards Goals: Ongoing  Interventions: Interventions utilized:  Supportive Reflection Standardized Assessments completed: Not Needed  Patient and/or Family Response: Patient agrees with treatment plan.   Assessment: Patient currently experiencing Grief and Psychosocial stress; MDD and Anxiety, per psychiatry.   Patient may benefit from continued therapeutic interventions.  Plan: Follow up with behavioral health clinician on : Call Ulrich Soules at [phone], as needed. Behavioral recommendations:  -Continue taking BH medication as prescribed -Continue plan to attend psychiatry appointment in about five weeks -Continue allowing time and space to grieve losses; consider pregnancy loss support group as needed -Begin prioritizing healthy self-care (regular meals, adequate rest) daily; notice if this helps to decrease headaches -Discuss headaches with PCP for ongoing management  Referral(s): [facility] (In Clinic)  I discussed the assessment and treatment plan with the patient and/or parent/guardian. They were provided an opportunity to ask questions and all were answered. They agreed with the plan and demonstrated an understanding of the instructions.   They were advised to call back or seek an in-person evaluation if the symptoms worsen or if the condition fails to improve as anticipated.  [place], LCSW     06/19/2022    3:59 PM 04/03/2022    4:00 PM 01/20/2022    2:47 PM 09/09/2021   11:31 AM 05/22/2021   10:50 AM  Depression screen PHQ 2/9  Decreased Interest 2 0 2    Down, Depressed, Hopeless 3 0 2    PHQ - 2 Score 5 0 4    Altered sleeping 3 0 3    Tired, decreased energy 3 0  2    Change in appetite 2 0 1     Feeling bad or failure about yourself  3 0 2    Trouble concentrating 2 0 2    Moving slowly or fidgety/restless 3 0 3    Suicidal thoughts 2 0 0    PHQ-9 Score 23 0 17    Difficult doing work/chores  Not difficult at all        Information is confidential and restricted. Go to Review Flowsheets to unlock data.      06/19/2022    3:59 PM 01/20/2022    2:47 PM 02/15/2021    9:09 AM 03/08/2020    2:30 PM  GAD 7 : Generalized Anxiety Score  Nervous, Anxious, on Edge '3 3 3 2  '$ Control/stop worrying '3 3 3 2  '$ Worry too much - different things '3 3 3 3  '$ Trouble relaxing '3 3 3 2  '$ Restless '3 2 3 2  '$ Easily annoyed or irritable '3 3 3 3  '$ Afraid - awful might happen '3 1 1 '$ 0  Total GAD 7 Score '21 18 19 '$ 14

## 2022-07-24 ENCOUNTER — Telehealth (HOSPITAL_BASED_OUTPATIENT_CLINIC_OR_DEPARTMENT_OTHER): Payer: Commercial Managed Care - HMO | Admitting: Psychiatry

## 2022-07-24 ENCOUNTER — Other Ambulatory Visit: Payer: Self-pay

## 2022-07-24 ENCOUNTER — Encounter (HOSPITAL_COMMUNITY): Payer: Self-pay | Admitting: Psychiatry

## 2022-07-24 DIAGNOSIS — F419 Anxiety disorder, unspecified: Secondary | ICD-10-CM | POA: Diagnosis not present

## 2022-07-24 DIAGNOSIS — F332 Major depressive disorder, recurrent severe without psychotic features: Secondary | ICD-10-CM | POA: Diagnosis not present

## 2022-07-24 DIAGNOSIS — F1290 Cannabis use, unspecified, uncomplicated: Secondary | ICD-10-CM | POA: Diagnosis not present

## 2022-07-24 MED ORDER — HYDROXYZINE PAMOATE 25 MG PO CAPS
25.0000 mg | ORAL_CAPSULE | Freq: Every evening | ORAL | 1 refills | Status: AC | PRN
  Filled 2022-07-24: qty 30, 15d supply, fill #0

## 2022-07-24 MED ORDER — SERTRALINE HCL 100 MG PO TABS
100.0000 mg | ORAL_TABLET | Freq: Every day | ORAL | 1 refills | Status: DC
  Filled 2022-07-24: qty 30, 30d supply, fill #0

## 2022-07-24 NOTE — Progress Notes (Signed)
Virtual Visit via Telephone Note  I connected with [name] on 07/24/22 at  2:20 PM EDT by telephone and verified that I am speaking with the correct person using two identifiers.  Location: Patient: In Car Provider: Office   I discussed the limitations, risks, security and privacy concerns of performing an evaluation and management service by telephone and the availability of in person appointments. I also discussed with the patient that there may be a patient responsible charge related to this service. The patient expressed understanding and agreed to proceed.   History of Present Illness: Patient is evaluated by phone session.  She is very sad because she had a miscarriage.  This is her second miscarriage and now she is going to talk to her OB/GYN about the reason with multiple miscarriages.  She has appointment coming up in November with extensive blood work.  She like to go back on her Zoloft.  She admitted anxiety, nervousness and not sleeping as good.  She is also sad because she did not respond to the job offer from [employer] on time.  She is working at [employer].  Her relationship with a boyfriend is going well.  She denies any suicidal thoughts or homicidal thoughts.  Her appetite is fair.  Energy level is low.  She was taking Zoloft however it was discontinued after she became pregnant.  She also like to go back on hydroxyzine.  She reported increased anxiety and nervousness but she had a good support from her boyfriend.  She denies drinking or using any illegal substances.   Past Psychiatric History: H/O twice IOP and ER visit for depression and suicidal thoughts. No h/o inpatient.     Psychiatric Specialty Exam: Physical Exam  Review of Systems  Weight 177 lb (80.3 kg), unknown if currently breastfeeding.There is no height or weight on file to calculate BMI.  General Appearance: NA  Eye Contact:  NA  Speech:  Clear and Coherent  Volume:  Normal  Mood:  Dysphoric   Affect:  NA  Thought Process:  Goal Directed  Orientation:  Full (Time, Place, and Person)  Thought Content:  Rumination  Suicidal Thoughts:  No  Homicidal Thoughts:  No  Memory:  Immediate;   Good Recent;   Good Remote;   Good  Judgement:  Intact  Insight:  Present  Psychomotor Activity:  NA  Concentration:  Concentration: Fair and Attention Span: Fair  Recall:  Good  Fund of Knowledge:  Good  Language:  Good  Akathisia:  No  Handed:  Right  AIMS (if indicated):     Assets:  Communication Skills Desire for Improvement Housing Resilience Social Support Talents/Skills Transportation  ADL's:  Intact  Cognition:  WNL  Sleep:   fair      Assessment and Plan: Major depressive disorder, recurrent.  Anxiety.  Discussed recent miscarriage.  Patient is going to have a visit with her OB/GYN with extensive blood work to find out the reason of multiple miscarriage.  She like to go back on Zoloft.  Resume Zoloft 100 mg but recommend to take 50 mg for a few days and then take the full dose of 100 mg.  We will also resume the hydroxyzine to help her anxiety.  Discussed medication side effects and benefits.  Recommend to call us back if she has any question or any concern.  Follow-up in 6 weeks.  Follow Up Instructions:    I discussed the assessment and treatment plan with the patient.  The patient was provided an opportunity to ask questions and all were answered. The patient agreed with the plan and demonstrated an understanding of the instructions.   The patient was advised to call back or seek an in-person evaluation if the symptoms worsen or if the condition fails to improve as anticipated.  Collaboration of Care: Other provider involved in patient's care AEB notes are available in epic to review.  Patient/Guardian was advised Release of Information must be obtained prior to any record release in order to collaborate their care with an outside provider. Patient/Guardian was  advised if they have not already done so to contact the registration department to sign all necessary forms in order for [place] to release information regarding their care.   Consent: Patient/Guardian gives verbal consent for treatment and assignment of benefits for services provided during this visit. Patient/Guardian expressed understanding and agreed to proceed.    I provided 28 minutes of non-face-to-face time during this encounter.   Kathlee Nations, MD

## 2022-07-30 ENCOUNTER — Ambulatory Visit: Payer: Commercial Managed Care - HMO | Admitting: Clinical

## 2022-07-30 ENCOUNTER — Other Ambulatory Visit: Payer: Self-pay

## 2022-07-30 DIAGNOSIS — F419 Anxiety disorder, unspecified: Secondary | ICD-10-CM

## 2022-07-30 DIAGNOSIS — F4321 Adjustment disorder with depressed mood: Secondary | ICD-10-CM

## 2022-07-30 DIAGNOSIS — Z658 Other specified problems related to psychosocial circumstances: Secondary | ICD-10-CM

## 2022-07-30 DIAGNOSIS — F339 Major depressive disorder, recurrent, unspecified: Secondary | ICD-10-CM

## 2022-10-01 ENCOUNTER — Ambulatory Visit: Payer: Commercial Managed Care - HMO | Admitting: Family Medicine

## 2022-10-05 ENCOUNTER — Encounter (HOSPITAL_COMMUNITY): Payer: Self-pay

## 2022-10-05 ENCOUNTER — Emergency Department (HOSPITAL_COMMUNITY): Payer: Commercial Managed Care - HMO

## 2022-10-05 ENCOUNTER — Other Ambulatory Visit: Payer: Self-pay

## 2022-10-05 ENCOUNTER — Emergency Department (HOSPITAL_COMMUNITY)
Admission: EM | Admit: 2022-10-05 | Discharge: 2022-10-05 | Discharge status: Against Medical Advice | Payer: Commercial Managed Care - HMO | Attending: Emergency Medicine | Admitting: Emergency Medicine

## 2022-10-05 DIAGNOSIS — Z20822 Contact with and (suspected) exposure to covid-19: Secondary | ICD-10-CM | POA: Insufficient documentation

## 2022-10-05 DIAGNOSIS — Z5321 Procedure and treatment not carried out due to patient leaving prior to being seen by health care provider: Secondary | ICD-10-CM | POA: Diagnosis not present

## 2022-10-05 DIAGNOSIS — J988 Other specified respiratory disorders: Secondary | ICD-10-CM | POA: Diagnosis not present

## 2022-10-05 DIAGNOSIS — B974 Respiratory syncytial virus as the cause of diseases classified elsewhere: Secondary | ICD-10-CM | POA: Insufficient documentation

## 2022-10-05 DIAGNOSIS — R519 Headache, unspecified: Secondary | ICD-10-CM

## 2022-10-05 LAB — RESP PANEL BY RT-PCR (RSV, FLU A&B, COVID)  RVPGX2
Influenza A by PCR: NEGATIVE
Influenza B by PCR: NEGATIVE
Resp Syncytial Virus by PCR: POSITIVE — AB
SARS Coronavirus 2 by RT PCR: NEGATIVE

## 2022-10-05 LAB — PREGNANCY, URINE: Preg Test, Ur: NEGATIVE

## 2022-10-05 MED ORDER — ONDANSETRON HCL 4 MG/2ML IJ SOLN
4.0000 mg | Freq: Once | INTRAMUSCULAR | Status: AC
  Administered 2022-10-05: 4 mg via INTRAVENOUS
  Filled 2022-10-05: qty 2

## 2022-10-05 MED ORDER — DIPHENHYDRAMINE HCL 25 MG PO CAPS
25.0000 mg | ORAL_CAPSULE | Freq: Once | ORAL | Status: AC
  Administered 2022-10-05: 25 mg via ORAL
  Filled 2022-10-05: qty 1

## 2022-10-05 MED ORDER — ACETAMINOPHEN 500 MG PO TABS
1000.0000 mg | ORAL_TABLET | Freq: Once | ORAL | Status: DC
  Filled 2022-10-05: qty 2

## 2022-10-05 MED ORDER — PROCHLORPERAZINE EDISYLATE 10 MG/2ML IJ SOLN
10.0000 mg | Freq: Once | INTRAMUSCULAR | Status: AC
  Administered 2022-10-05: 10 mg via INTRAVENOUS
  Filled 2022-10-05: qty 2

## 2022-10-05 MED ORDER — KETOROLAC TROMETHAMINE 15 MG/ML IJ SOLN
15.0000 mg | Freq: Once | INTRAMUSCULAR | Status: AC
  Administered 2022-10-05: 15 mg via INTRAVENOUS
  Filled 2022-10-05: qty 1

## 2022-10-05 MED ORDER — LACTATED RINGERS IV BOLUS
1000.0000 mL | Freq: Once | INTRAVENOUS | Status: AC
  Administered 2022-10-05: 1000 mL via INTRAVENOUS

## 2022-10-05 NOTE — ED Triage Notes (Signed)
Pt reports hx of migraines but this feels like the pain is moving around in her head, has had cold symptoms since Wednesday but is not concerned about those presently.

## 2022-10-05 NOTE — ED Provider Notes (Signed)
[facility] EMERGENCY DEPARTMENT Provider Note   CSN: [id number] Arrival date & time: 10/05/22  1701     History  Chief Complaint  Patient presents with   Migraine    Joan Knight is a 36 y.o. 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   Presents with headache located in the top of her head, rated 10 out of 10.  Pt reports hx of migraines but this feels like the pain is moving around in her head, has had cold symptoms since Wednesday including rhinorrhea and scratchy throat/hoarse voice. Has a history of blood clots and used to take Xarelto but is not anymore, was concerned about a blood clot in her head.  She states that she had generalized weakness when the pain moved in her head earlier and felt lightheaded but feels okay at this time.  Denies any fever/chills, visual changes, chest pain, shortness of breath, abdominal pain, nausea vomiting diarrhea constipation, lower extremity edema, numbness/tingling, asymmetric weakness, urinary incontinence/frequency/dysuria.   Migraine      Home Medications Prior to Admission medications   Medication Sig Start Date End Date Taking? Authorizing Provider  acetaminophen (TYLENOL) 500 MG tablet Take 1,000 mg by mouth every 6 (six) hours as needed for mild pain, fever or headache. Patient not taking: Reported on 06/19/2022    [provider]  acetaminophen-codeine (TYLENOL #3) 300-30 MG tablet Take 1 tablet by mouth every 4 (four) hours as needed for moderate pain. 06/09/22   Starr Lake, CNM  clobetasol cream (TEMOVATE) 6.76 % Apply 1 Application topically 2 (two) times daily. 06/30/22   Dorna Mai, MD  hydrOXYzine (VISTARIL) 25 MG capsule Take 1 capsule (25 mg total) by mouth at bedtime and may repeat dose one time if needed. 07/24/22   Arfeen, Arlyce Harman, MD  ibuprofen (ADVIL) 800 MG tablet Take 1 tablet (800 mg total) by mouth every 8 (eight) hours as needed.  06/09/22   Starr Lake, CNM  misoprostol (CYTOTEC) 200 MCG tablet Place two pills between cheek and gum on each side and let absorb. Swallow after 30 minutes. 06/19/22   Starr Lake, CNM  nicotine (NICODERM CQ - DOSED IN MG/24 HR) 7 mg/24hr patch Place 1 patch (7 mg total) onto the skin daily. Patient not taking: Reported on 06/19/2022 06/04/22   Deloris Ping, CNM  ondansetron (ZOFRAN) 8 MG tablet Take 1 tablet (8 mg total) by mouth every 8 (eight) hours as needed for nausea or vomiting. Patient not taking: Reported on 06/19/2022 06/09/22   Starr Lake, CNM  promethazine (PHENERGAN) 25 MG tablet Take 1 tablet (25 mg total) by mouth every 6 (six) hours as needed for nausea or vomiting. Patient not taking: Reported on 06/19/2022 06/09/22   Starr Lake, CNM  sertraline (ZOLOFT) 100 MG tablet Take 1 tablet (100 mg total) by mouth daily. 07/24/22 07/24/23  Kathlee Nations, MD      Allergies    Patient has no known allergies.    Review of Systems   Review of Systems Review of systems Negative for f/c.  A 10 point review of systems was performed and is negative unless otherwise reported in HPI.  Physical Exam Updated Vital Signs BP 117/89 (BP Location: Right Arm)   Pulse 69   Temp 98 F (36.7 C) (Oral)   Resp 18   Ht '5\' 1"'$  (1.549 m)   Wt 65.8 kg   LMP 09/30/2022  SpO2 100%   BMI 27.40 kg/m  Physical Exam General: Normal appearing female, lying in bed.  HEENT: PERRLA, EOMI, no nystagmus, Sclera anicteric, MMM, trachea midline.  Cardiology: RRR, no murmurs/rubs/gallops. BL radial and DP pulses equal bilaterally.  Resp: Normal respiratory rate and effort. CTAB, no wheezes, rhonchi, crackles.  Abd: Soft, non-tender, non-distended. No rebound tenderness or guarding.  GU: Deferred. MSK: No peripheral edema or signs of trauma. Extremities without deformity or TTP. No cyanosis or clubbing. Skin: warm, dry. No rashes or lesions. Back:  No CVA tenderness Neuro: A&Ox4, CNs II-XII grossly intact. 5/5 strength in all four extremities. Sensation grossly intact. Normal speech, normal gait. Tongue protrudes midline. Psych: Normal mood and affect.   ED Results / Procedures / Treatments   Labs (all labs ordered are listed, but only abnormal results are displayed) Labs Reviewed  RESP PANEL BY RT-PCR (RSV, FLU A&B, COVID)  [id number]    EKG None  Radiology No results found.  Procedures Procedures    Medications Ordered in ED Medications - No data to display  ED Course/ Medical Decision Making/ A&P                          Medical Decision Making Amount and/or Complexity of Data Reviewed Labs: ordered. Decision-making details documented in ED Course. Radiology: ordered. Decision-making details documented in ED Course.  Risk OTC drugs. Prescription drug management.    This patient presents to the ED for concern of headache, this involves an extensive number of treatment options, and is a complaint that carries with it a high risk of complications and morbidity.  I considered the following differential and admission for this acute, potentially life threatening condition.   MDM:    Consider viral syndrome given patient's rhinorrhea/sore throat. No tonsillar exudates or tonsillar pillar symmetry to suggest strep throat or PTA. Patient with headache, consider tension headache, migraine. She is concerned about an intracranial coagulation such as dural venous sinus thrombosis but patient has no focal neuro deficits. No visual changes to indicate IIH. Normal neuro exam is very reassuring. Will obtain CTH as this is an unusual headache for her and not like her prior migraines. Will also obtain viral testing and pregnancy test.   Clinical Course as of 10/06/22 0028  Sun Oct 05, 2022  1852 Respiratory Syncytial Virus by PCR(!): POSITIVE [HN]  2043 CT Head Wo Contrast No acute intracranial pathology. [HN]    Clinical Course  User Index [HN] Audley Hose, MD    Labs: I Ordered, and personally interpreted labs.  The pertinent results include:  +RSV  Imaging Studies ordered: I ordered imaging studies including CTH I independently visualized and interpreted imaging. I agree with the radiologist interpretation  Cardiac Monitoring: The patient was maintained on a cardiac monitor.  I personally viewed and interpreted the cardiac monitored which showed an underlying rhythm of: NSR  Reevaluation: After the interventions noted above, I reevaluated the patient and found that they have :improved  Social Determinants of Health: Patient lives independently   Disposition:  patient was given a headache cocktail not including tylenol as she stated she took that earlier today.  Went to reevaluate the patient and found that she had eloped from the emergency department.  Nursing stated that patient had mentioned she felt better and that she wanted to go out and party for New Year's Eve. CTH was negative for acute findings.   Eloped   Co morbidities that complicate the  patient evaluation  Past Medical History:  Diagnosis Date   Anemia    Anxiety    Depression    DVT (deep venous thrombosis) ([place])    Had history of DVT in the right lower extremity about 5 years ago.  She tells me she was on blood thinner for about a year and then she stopped taking it.     Eczema    GERD (gastroesophageal reflux disease)    Nexplanon in place 02/24/2017   Sickle cell anemia (HCC)    Sickle cell trait (HCC)    Vaginal Pap smear, abnormal    "scraped or cut off" (?LEEP) no problems since     Medicines No orders of the defined types were placed in this encounter.   I have reviewed the patients home medicines and have made adjustments as needed  Problem List / ED Course: Problem List Items Addressed This Visit   None Visit Diagnoses     Acute nonintractable headache, unspecified headache type    -  Primary   Relevant  Medications   acetaminophen (TYLENOL) tablet 1,000 mg   ketorolac (TORADOL) 15 MG/ML injection 15 mg (Completed)   Eloped from emergency department                       This note was created using dictation software, which may contain spelling or grammatical errors.    Audley Hose, MD 10/21/22 2211

## 2022-10-05 NOTE — ED Notes (Signed)
Pt ambulated to the restroom with no assistance.

## 2022-10-08 ENCOUNTER — Ambulatory Visit
Admission: EM | Admit: 2022-10-08 | Discharge: 2022-10-08 | Disposition: A | Discharge status: Home / Self Care | Payer: Commercial Managed Care - HMO | Attending: Physician Assistant | Admitting: Physician Assistant

## 2022-10-08 ENCOUNTER — Other Ambulatory Visit: Payer: Self-pay

## 2022-10-08 DIAGNOSIS — J029 Acute pharyngitis, unspecified: Secondary | ICD-10-CM | POA: Diagnosis not present

## 2022-10-08 DIAGNOSIS — J210 Acute bronchiolitis due to respiratory syncytial virus: Secondary | ICD-10-CM

## 2022-10-08 LAB — POCT RAPID STREP A (OFFICE): Rapid Strep A Screen: NEGATIVE

## 2022-10-08 MED ORDER — HYDROCODONE BIT-HOMATROP MBR 5-1.5 MG/5ML PO SOLN: 5.0000 mL | Freq: Four times a day (QID) | ORAL | 0 refills | Status: DC | PRN

## 2022-10-08 MED ORDER — AZITHROMYCIN 250 MG PO TABS: ORAL_TABLET | ORAL | 0 refills | Status: DC

## 2022-10-08 MED ORDER — AEROCHAMBER PLUS FLO-VU MEDIUM MISC
1.0000 | Freq: Once | Status: AC
  Administered 2022-10-08: 1

## 2022-10-08 MED ORDER — ALBUTEROL SULFATE HFA 108 (90 BASE) MCG/ACT IN AERS
2.0000 | INHALATION_SPRAY | Freq: Once | RESPIRATORY_TRACT | Status: AC
  Administered 2022-10-08: 2 via RESPIRATORY_TRACT

## 2022-10-08 NOTE — ED Triage Notes (Signed)
States was seen by an emergency room dx w/ RSV but left before discharge with plan. Here for symptom relief. Sxs include: nasal congestion with drainage, cough, onset ~ Wed. Denies pain in triage.

## 2022-10-08 NOTE — Discharge Instructions (Signed)
Advised to use the albuterol inhaler with spacer, 2 puffs every 6 hours on a regular basis to help decrease cough, wheezing, shortness of breath. Advised take the Hycodan cough syrup mainly during the evening and at night, 1 to 2 teaspoons every 6-8 hours as needed to help control cough and congestion.  (Be careful with this medicine as it does cause drowsiness and sedation) Advised take the Zithromax 250 mg, 2 tablets today and then 1 tablet daily until medication is completed to treat respiratory infection. Advise use Tylenol or ibuprofen only if needed for fever. Advised follow-up PCP or return to urgent care if symptoms fail to improve.

## 2022-10-08 NOTE — ED Provider Notes (Signed)
[name]    CSN: [id number] Arrival date & time: 10/08/22  0848      History   Chief Complaint Chief Complaint  Patient presents with   RSV(+)    HPI Joan Knight is a 37 y.o. female.   37 year old female presents with cough, congestion, wheezing and sore throat.  Patient indicates that on 12/31 she was seen at [facility] the ER and had a positive RSV test.  Patient indicates that she left before treatment could be initiated.  She indicates that she continues to have upper respiratory congestion with rhinitis, postnasal drip, and production mainly being yellow-green from upper respiratory symptoms.  Patient also indicates for the past 2 days she has been having progressive sore throat, painful swallowing that has been persistent.  Patient also indicates that she has been having chest congestion with cough, wheezing, and shortness of breath.  Patient indicates that she has been having coughing   Patient indicates that she has been having coughing exacerbations mainly occurring at night which is causing her to have chest discomfort due to coughing so much and it is associated with some wheezing and shortness of breath.  Indicates that she has been taking some OTC type medications but this is not controlling her cough.  Patient denies fever but she has had some chills and fatigue.  Patient indicates that she does smoke a pack of cigarettes every 2 to 3 days.  She indicates she has been smoking ever since she was 19. Patient indicates that she did have a CT scan of the head performed at [facility] due to having progressive headaches.  Past Medical History:  Diagnosis Date   Anemia    Anxiety    Depression    DVT (deep venous thrombosis) ([place])    Had history of DVT in the right lower extremity about 5 years ago.  She tells me she was on blood thinner for about a year and then she stopped taking it.     Eczema    GERD (gastroesophageal reflux disease)    Nexplanon in  place 02/24/2017   Sickle cell anemia (HCC)    Sickle cell trait (HCC)    Vaginal Pap smear, abnormal    "scraped or cut off" (?LEEP) no problems since    Patient Active Problem List   Diagnosis Date Noted   Miscarriage 06/09/2022   Cervical lymphadenopathy 07/29/2021   Tobacco abuse 09/26/2019   DVT (deep venous thrombosis) (HCC)    Hb-SS disease without crisis ([place]) 02/24/2017    Past Surgical History:  Procedure Laterality Date   LEEP     REMOVAL OF IMPLANON ROD  09/26/2019    OB History     Gravida  2   Para  0   Term  0   Preterm  0   AB  2   Living  0      SAB  2   IAB  0   Ectopic  0   Multiple  0   Live Births  0            Home Medications    Prior to Admission medications   Medication Sig Start Date End Date Taking? Authorizing Provider  azithromycin (ZITHROMAX Z-PAK) 250 MG tablet Take 2 tablets initially today, then 1 tablet daily until medication is completed. 10/08/22  Yes Nyoka Lint, PA-C  HYDROcodone bit-homatropine (HYCODAN) 5-1.5 MG/5ML syrup Take 5 mLs by mouth every 6 (six) hours as needed for cough.  10/08/22  Yes Nyoka Lint, PA-C  acetaminophen (TYLENOL) 500 MG tablet Take 1,000 mg by mouth every 6 (six) hours as needed for mild pain, fever or headache.    [provider]  acetaminophen-codeine (TYLENOL #3) 300-30 MG tablet Take 1 tablet by mouth every 4 (four) hours as needed for moderate pain. Patient not taking: Reported on 10/05/2022 06/09/22   Starr Lake, CNM  Chlorphen-Pseudoephed-APAP [facility] FLU/COLD/SORE THROAT PO) Take 1 packet by mouth as needed (flu symtoms).    [provider]  clobetasol cream (TEMOVATE) 1.28 % Apply 1 Application topically 2 (two) times daily. Patient not taking: Reported on 10/05/2022 06/30/22   Dorna Mai, MD  hydrOXYzine (VISTARIL) 25 MG capsule Take 1 capsule (25 mg total) by mouth at bedtime and may repeat dose one time if needed. 07/24/22   Arfeen, Arlyce Harman,  MD  ibuprofen (ADVIL) 800 MG tablet Take 1 tablet (800 mg total) by mouth every 8 (eight) hours as needed. 06/09/22   Starr Lake, CNM  misoprostol (CYTOTEC) 200 MCG tablet Place two pills between cheek and gum on each side and let absorb. Swallow after 30 minutes. 06/19/22   Starr Lake, CNM  nicotine (NICODERM CQ - DOSED IN MG/24 HR) 7 mg/24hr patch Place 1 patch (7 mg total) onto the skin daily. Patient not taking: Reported on 06/19/2022 06/04/22   Deloris Ping, CNM  ondansetron (ZOFRAN) 8 MG tablet Take 1 tablet (8 mg total) by mouth every 8 (eight) hours as needed for nausea or vomiting. Patient not taking: Reported on 06/19/2022 06/09/22   Starr Lake, CNM  promethazine (PHENERGAN) 25 MG tablet Take 1 tablet (25 mg total) by mouth every 6 (six) hours as needed for nausea or vomiting. Patient not taking: Reported on 06/19/2022 06/09/22   Starr Lake, CNM  sertraline (ZOLOFT) 100 MG tablet Take 1 tablet (100 mg total) by mouth daily. 07/24/22 07/24/23  Arfeen, Arlyce Harman, MD    Family History Family History  Problem Relation Age of Onset   Asthma Mother    Anxiety disorder Mother    Depression Mother    Bronchitis Mother    Emphysema Mother    COPD Mother    Alcohol abuse Father    Heart disease Father        heart murmur   Hypertension Father    Deep vein thrombosis Father    Depression Father    Heart Problems Father    Diabetes Father    Breast cancer Maternal Aunt    Bone cancer Maternal Grandfather    Congenital heart disease Maternal Grandmother    Pancreatic cancer Neg Hx    Stomach cancer Neg Hx    Esophageal cancer Neg Hx    Colon cancer Neg Hx     Social History Social History   Tobacco Use   Smoking status: Every Day    Packs/day: 0.25    Years: 16.00    Total pack years: 4.00    Types: Cigarettes   Smokeless tobacco: Never   Tobacco comments:    Cutting back, plans to quit   Vaping Use   Vaping  Use: Former   Substances: Nicotine  Substance Use Topics   Alcohol use: Not Currently    Comment: occ/social   Drug use: Not Currently    Types: Marijuana    Comment: Stopped once pregnant     Allergies   Patient has no known allergies.   Review of Systems Review of  Systems  HENT:  Positive for postnasal drip, rhinorrhea, sinus pressure and sore throat.   Respiratory:  Positive for cough, shortness of breath and wheezing.      Physical Exam Triage Vital Signs ED Triage Vitals [10/08/22 0913]  Enc Vitals Group     BP 118/80     Pulse Rate 77     Resp 18     Temp 98.3 F (36.8 C)     Temp Source Oral     SpO2 99 %     Weight      Height      Head Circumference      Peak Flow      Pain Score 0     Pain Loc      Pain Edu?      Excl. in [place]?    No data found.  Updated Vital Signs BP 118/80 (BP Location: Right Arm)   Pulse 77   Temp 98.3 F (36.8 C) (Oral)   Resp 18   LMP 09/30/2022   SpO2 99%   Breastfeeding No   Visual Acuity Right Eye Distance:   Left Eye Distance:   Bilateral Distance:    Right Eye Near:   Left Eye Near:    Bilateral Near:     Physical Exam Constitutional:      Appearance: Normal appearance.  HENT:     Right Ear: Ear canal normal. Tympanic membrane is injected.     Left Ear: Ear canal normal. Tympanic membrane is injected.     Mouth/Throat:     Mouth: Mucous membranes are moist.     Pharynx: Posterior oropharyngeal erythema present. No pharyngeal swelling or oropharyngeal exudate.  Cardiovascular:     Rate and Rhythm: Normal rate and regular rhythm.     Heart sounds: Normal heart sounds.  Pulmonary:     Effort: Pulmonary effort is normal.     Breath sounds: Normal breath sounds and air entry. No wheezing, rhonchi or rales.  Lymphadenopathy:     Cervical: No cervical adenopathy.  Neurological:     Mental Status: She is alert.      UC Treatments / Results  Labs (all labs ordered are listed, but only abnormal results  are displayed) Labs Reviewed  POCT RAPID STREP A (OFFICE)   *RSV test positive on 10/05/2022.  Head CT scan on 10/05/2022 Negative/Normal.  EKG   Radiology No results found.  Procedures Procedures (including critical care time)  Medications Ordered in UC Medications  albuterol (VENTOLIN HFA) 108 (90 Base) MCG/ACT inhaler 2 puff (2 puffs Inhalation Given 10/08/22 0934)  AeroChamber Plus Flo-Vu Medium MISC 1 each (1 each Other Given 10/08/22 0934)    Initial Impression / Assessment and Plan / UC Course  I have reviewed the triage vital signs and the nursing notes.  Pertinent labs & imaging results that were available during my care of the patient were reviewed by me and considered in my medical decision making (see chart for details).    Plan: 1.  The acute bronchitis due to RSV will be treated with the following: A.  Albuterol inhaler with spacer, 2 puffs every 6 hours on a regular basis to help control cough, wheezing, and shortness of breath. B.  Zithromax 250 mg, 2 tablets today and then 1 tablet daily until medication completed to treat respiratory infection. C.  Hycodan cough syrup, 1 to 2 teaspoons every 6-8 hours as needed to control cough.  Patient advised to this medicine when at home  and at bedtime only. 2.  The sore throat will be treated with the following: A.  Advised the patient to take ibuprofen or Tylenol as needed for sore throat discomfort. 3.  Patient advised follow-up PCP or return to urgent care as needed. Final Clinical Impressions(s) / UC Diagnoses   Final diagnoses:  Acute bronchiolitis due to respiratory syncytial virus (RSV)  Sore throat     Discharge Instructions      Advised to use the albuterol inhaler with spacer, 2 puffs every 6 hours on a regular basis to help decrease cough, wheezing, shortness of breath. Advised take the Hycodan cough syrup mainly during the evening and at night, 1 to 2 teaspoons every 6-8 hours as needed to help control  cough and congestion.  (Be careful with this medicine as it does cause drowsiness and sedation) Advised take the Zithromax 250 mg, 2 tablets today and then 1 tablet daily until medication is completed to treat respiratory infection. Advise use Tylenol or ibuprofen only if needed for fever. Advised follow-up PCP or return to urgent care if symptoms fail to improve.    ED Prescriptions     Medication Sig Dispense Auth. Provider   HYDROcodone bit-homatropine (HYCODAN) 5-1.5 MG/5ML syrup Take 5 mLs by mouth every 6 (six) hours as needed for cough. 120 mL Nyoka Lint, PA-C   azithromycin (ZITHROMAX Z-PAK) 250 MG tablet Take 2 tablets initially today, then 1 tablet daily until medication is completed. 6 each Nyoka Lint, PA-C      I have reviewed the PDMP during this encounter.   Nyoka Lint, PA-C 10/08/22 [phone]

## 2022-10-11 ENCOUNTER — Emergency Department (HOSPITAL_COMMUNITY)
Admission: EM | Admit: 2022-10-11 | Discharge: 2022-10-11 | Disposition: A | Discharge status: Home / Self Care | Payer: 59 | Attending: Emergency Medicine | Admitting: Emergency Medicine

## 2022-10-11 ENCOUNTER — Other Ambulatory Visit: Payer: Self-pay

## 2022-10-11 ENCOUNTER — Encounter (HOSPITAL_COMMUNITY): Payer: Self-pay

## 2022-10-11 DIAGNOSIS — R42 Dizziness and giddiness: Secondary | ICD-10-CM

## 2022-10-11 DIAGNOSIS — H748X3 Other specified disorders of middle ear and mastoid, bilateral: Secondary | ICD-10-CM | POA: Insufficient documentation

## 2022-10-11 NOTE — ED Provider Notes (Cosign Needed Addendum)
[facility] EMERGENCY DEPARTMENT Provider Note   CSN: [id number] Arrival date & time: 10/11/22  1101     History  Chief Complaint  Patient presents with   Dizziness    Joan Knight is a 37 y.o. female.   Dizziness Associated symptoms: no chest pain, no headaches, no nausea, no shortness of breath, no vomiting and no weakness         Joan Knight is a 37 y.o. female who presents to the Emergency Department complaining of episode of dizziness after blowing her nose earlier today.  She was seen at urgent care on 10/08/2022 diagnosed with RSV.  Overall, she states that she is feeling better but had an episode of dizziness and felt as though the room was spinning after blowing her nose rather hard earlier today.  Symptoms lasted for approximately 1 hour before spontaneously resolving.  She denies any nausea, vomiting, neck pain, visual changes or headache.   Home Medications Prior to Admission medications   Medication Sig Start Date End Date Taking? Authorizing Provider  acetaminophen (TYLENOL) 500 MG tablet Take 1,000 mg by mouth every 6 (six) hours as needed for mild pain, fever or headache.    [provider]  acetaminophen-codeine (TYLENOL #3) 300-30 MG tablet Take 1 tablet by mouth every 4 (four) hours as needed for moderate pain. Patient not taking: Reported on 10/05/2022 06/09/22   Starr Lake, CNM  azithromycin (ZITHROMAX Z-PAK) 250 MG tablet Take 2 tablets initially today, then 1 tablet daily until medication is completed. 10/08/22   Nyoka Lint, PA-C  Chlorphen-Pseudoephed-APAP [facility] FLU/COLD/SORE THROAT PO) Take 1 packet by mouth as needed (flu symtoms).    [provider]  clobetasol cream (TEMOVATE) 1.02 % Apply 1 Application topically 2 (two) times daily. Patient not taking: Reported on 10/05/2022 06/30/22   Dorna Mai, MD  HYDROcodone bit-homatropine [facility]) 5-1.5 MG/5ML syrup Take 5 mLs by mouth every 6 (six) hours as needed  for cough. 10/08/22   Nyoka Lint, PA-C  hydrOXYzine (VISTARIL) 25 MG capsule Take 1 capsule (25 mg total) by mouth at bedtime and may repeat dose one time if needed. 07/24/22   Arfeen, Arlyce Harman, MD  ibuprofen (ADVIL) 800 MG tablet Take 1 tablet (800 mg total) by mouth every 8 (eight) hours as needed. 06/09/22   Starr Lake, CNM  misoprostol (CYTOTEC) 200 MCG tablet Place two pills between cheek and gum on each side and let absorb. Swallow after 30 minutes. 06/19/22   Starr Lake, CNM  nicotine (NICODERM CQ - DOSED IN MG/24 HR) 7 mg/24hr patch Place 1 patch (7 mg total) onto the skin daily. Patient not taking: Reported on 06/19/2022 06/04/22   Deloris Ping, CNM  ondansetron (ZOFRAN) 8 MG tablet Take 1 tablet (8 mg total) by mouth every 8 (eight) hours as needed for nausea or vomiting. Patient not taking: Reported on 06/19/2022 06/09/22   Starr Lake, CNM  promethazine (PHENERGAN) 25 MG tablet Take 1 tablet (25 mg total) by mouth every 6 (six) hours as needed for nausea or vomiting. Patient not taking: Reported on 06/19/2022 06/09/22   Starr Lake, CNM  sertraline (ZOLOFT) 100 MG tablet Take 1 tablet (100 mg total) by mouth daily. 07/24/22 07/24/23  Kathlee Nations, MD      Allergies    Patient has no known allergies.    Review of Systems   Review of Systems  Constitutional:  Negative for appetite change, chills and fever.  HENT:  Positive for congestion.   Eyes:  Negative for visual disturbance.  Respiratory:  Negative for chest tightness and shortness of breath.   Cardiovascular:  Negative for chest pain.  Gastrointestinal:  Negative for abdominal pain, nausea and vomiting.  Genitourinary:  Negative for dysuria.  Musculoskeletal:  Negative for arthralgias, neck pain and neck stiffness.  Skin:  Negative for rash.  Neurological:  Positive for dizziness. Negative for seizures, speech difficulty, weakness, numbness and headaches.   Psychiatric/Behavioral:  Negative for confusion.     Physical Exam Updated Vital Signs BP 104/79 (BP Location: Right Arm)   Pulse 72   Temp 98.1 F (36.7 C) (Oral)   Resp 18   Ht '5\' 1"'$  (1.549 m)   Wt 80.7 kg   LMP 09/30/2022   SpO2 100%   BMI 33.63 kg/m  Physical Exam Vitals and nursing note reviewed.  Constitutional:      General: She is not in acute distress.    Appearance: Normal appearance. She is not ill-appearing or toxic-appearing.  HENT:     Right Ear: Ear canal normal. A middle ear effusion is present. No hemotympanum. Tympanic membrane is not erythematous.     Left Ear: Ear canal normal. A middle ear effusion is present. No hemotympanum. Tympanic membrane is not erythematous.     Nose: No rhinorrhea.     Mouth/Throat:     Mouth: Mucous membranes are moist.     Pharynx: Oropharynx is clear. No oropharyngeal exudate or posterior oropharyngeal erythema.  Eyes:     Conjunctiva/sclera: Conjunctivae normal.     Pupils: Pupils are equal, round, and reactive to light.  Cardiovascular:     Rate and Rhythm: Normal rate and regular rhythm.     Pulses: Normal pulses.  Pulmonary:     Effort: Pulmonary effort is normal.     Breath sounds: Normal breath sounds.  Musculoskeletal:        General: Normal range of motion.  Lymphadenopathy:     Cervical: No cervical adenopathy.  Skin:    General: Skin is warm.     Capillary Refill: Capillary refill takes less than 2 seconds.     Findings: No rash.  Neurological:     General: No focal deficit present.     Mental Status: She is alert.     Sensory: No sensory deficit.     Motor: No weakness.     ED Results / Procedures / Treatments   Labs (all labs ordered are listed, but only abnormal results are displayed) Labs Reviewed - No data to display  EKG None  Radiology No results found.  Procedures Procedures    Medications Ordered in ED Medications - No data to display  ED Course/ Medical Decision Making/ A&P                            Medical Decision Making Patient here for evaluation of episode of vertigo earlier today after blowing her nose.  Was diagnosed with RSV 3 days ago.  Was seen at urgent care.  She was prescribed cough syrup and antibiotic for her symptoms.  Reports feeling better, but here due to concern of dizziness which she has never had before.  She denies any associated headache, chest pain, neck pain or stiffness, fever visual changes  On my exam, patient well-appearing nontoxic.  Vital signs reassuring.  Clinically she does not appear dehydrated.  Orthostatics were performed.  Had some milder dizziness with standing  today.  Mild effusions of bilateral middle ears.  Offered IV fluids, but patient declines does state that she will increase her water intake today and tomorrow.  She is requesting to go home.    Amount and/or Complexity of Data Reviewed Discussion of management or test interpretation with external provider(s): Patient appears appropriate for discharge home, recommended short course of Sudafed or Dramamine for her symptoms.  Will increase her water intake at home today and tomorrow.  I recommended close outpatient follow-up she is agreeable to plan.  She is requesting discharge home.  I feel that this is reasonable as I doubt emergent process     Final Clinical Impression(s) / ED Diagnoses Final diagnoses:  Vertigo    Rx / DC Orders ED Discharge Orders     None         Kem Parkinson, PA-C 10/11/22 1332    Kem Parkinson, PA-C 10/11/22 1337    Godfrey Pick, MD 10/15/22 1143

## 2022-10-11 NOTE — Discharge Instructions (Signed)
I recommend that you drink plenty of water today and tomorrow.  You may also take over-the-counter Sudafed as directed for 3 to 4 days.  If your dizziness continues, you can take over-the-counter Dramamine as directed.  Do not take the Sudafed and Dramamine together.  Follow-up with your primary care provider for recheck

## 2022-10-11 NOTE — ED Notes (Signed)
ED Provider at bedside. 

## 2022-10-11 NOTE — ED Triage Notes (Signed)
Pt diagnosed with RSV 4 days ago at this facility and has been feeling improved.  States after taking meds this morning she blew her nose and got a dizzy spell that lasted an hour.  She is not currently dizzy but was concerned because that has not happened to her before.

## 2022-11-03 ENCOUNTER — Encounter: Payer: Self-pay | Admitting: Obstetrics and Gynecology

## 2022-11-03 ENCOUNTER — Other Ambulatory Visit (HOSPITAL_COMMUNITY)
Admission: RE | Admit: 2022-11-03 | Discharge: 2022-11-03 | Disposition: A | Discharge status: Home / Self Care | Payer: 59 | Source: Ambulatory Visit | Attending: Obstetrics and Gynecology | Admitting: Obstetrics and Gynecology

## 2022-11-03 ENCOUNTER — Ambulatory Visit (INDEPENDENT_AMBULATORY_CARE_PROVIDER_SITE_OTHER): Payer: 59 | Admitting: Obstetrics and Gynecology

## 2022-11-03 VITALS — BP 108/73 | HR 96

## 2022-11-03 DIAGNOSIS — Z01419 Encounter for gynecological examination (general) (routine) without abnormal findings: Secondary | ICD-10-CM | POA: Diagnosis not present

## 2022-11-03 DIAGNOSIS — Z202 Contact with and (suspected) exposure to infections with a predominantly sexual mode of transmission: Secondary | ICD-10-CM

## 2022-11-03 DIAGNOSIS — Z124 Encounter for screening for malignant neoplasm of cervix: Secondary | ICD-10-CM

## 2022-11-03 DIAGNOSIS — N96 Recurrent pregnancy loss: Secondary | ICD-10-CM | POA: Diagnosis not present
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
## 2022-11-04 ENCOUNTER — Encounter: Payer: Self-pay | Admitting: Obstetrics and Gynecology

## 2022-11-04 ENCOUNTER — Other Ambulatory Visit: Payer: Self-pay | Admitting: Obstetrics and Gynecology

## 2022-11-04 DIAGNOSIS — B9689 Other specified bacterial agents as the cause of diseases classified elsewhere: Secondary | ICD-10-CM

## 2022-11-04 DIAGNOSIS — B379 Candidiasis, unspecified: Secondary | ICD-10-CM

## 2022-11-04 LAB — CERVICOVAGINAL ANCILLARY ONLY
Bacterial Vaginitis (gardnerella): POSITIVE — AB
Candida Glabrata: NEGATIVE
Candida Vaginitis: NEGATIVE
Chlamydia: NEGATIVE
Comment: NEGATIVE
Comment: NEGATIVE
Comment: NEGATIVE
Comment: NEGATIVE
Comment: NEGATIVE
Comment: NORMAL
Neisseria Gonorrhea: NEGATIVE
Trichomonas: NEGATIVE

## 2022-11-04 MED ORDER — METRONIDAZOLE 500 MG PO TABS
500.0000 mg | ORAL_TABLET | Freq: Two times a day (BID) | ORAL | 0 refills | Status: AC
  Filled 2022-11-04: qty 14, 7d supply, fill #0

## 2022-11-05 ENCOUNTER — Other Ambulatory Visit: Payer: Self-pay

## 2022-11-05 MED ORDER — FLUCONAZOLE 150 MG PO TABS
150.0000 mg | ORAL_TABLET | Freq: Once | ORAL | 0 refills | Status: AC
  Filled 2022-11-05: qty 1, 1d supply, fill #0

## 2022-11-12 LAB — CYTOLOGY - PAP
Comment: NEGATIVE
Diagnosis: NEGATIVE
Diagnosis: REACTIVE
High risk HPV: NEGATIVE

## 2022-11-18 LAB — CHROMOSOME, BLOOD, ROUTINE
Cells Analyzed: 20
Cells Counted: 20
Cells Karyotyped: 2
GTG Band Resolution Achieved: 500

## 2022-11-18 LAB — HIV ANTIBODY (ROUTINE TESTING W REFLEX): HIV Screen 4th Generation wRfx: NONREACTIVE

## 2022-11-18 LAB — BETA-2-GLYCOPROTEIN I ABS, IGG/M/A
Beta-2 Glyco 1 IgA: <9 GPI IgA units (ref 0–25)
Beta-2 Glyco 1 IgM: <9 GPI IgM units (ref 0–32)
Beta-2 Glyco I IgG: <9 GPI IgG units (ref 0–20)

## 2022-11-18 LAB — PROLACTIN: Prolactin: 7.4 ng/mL (ref 4.8–33.4)

## 2022-11-18 LAB — HEPATITIS B SURFACE ANTIGEN: Hepatitis B Surface Ag: NEGATIVE

## 2022-11-18 LAB — RPR: RPR Ser Ql: NONREACTIVE

## 2022-11-18 LAB — CARDIOLIPIN ANTIBODIES, IGM+IGG
Anticardiolipin IgG: <9 GPL U/mL (ref 0–14)
Anticardiolipin IgM: <9 MPL U/mL (ref 0–12)

## 2022-11-18 LAB — HEPATITIS C ANTIBODY: Hep C Virus Ab: NONREACTIVE

## 2022-11-19 ENCOUNTER — Encounter: Payer: Self-pay | Admitting: Obstetrics and Gynecology

## 2022-11-19 ENCOUNTER — Telehealth: Payer: Self-pay | Admitting: Lactation Services

## 2022-11-19 NOTE — Telephone Encounter (Signed)
Referral faxed to [facility]. Fax Confirmation received.

## 2022-11-20 ENCOUNTER — Encounter: Payer: Self-pay | Admitting: Family Medicine

## 2022-11-20 NOTE — Telephone Encounter (Signed)
Msg sent to scheduler to  call patient for appt

## 2023-01-09 ENCOUNTER — Inpatient Hospital Stay (HOSPITAL_COMMUNITY): Admission: AD | Admit: 2023-01-09 | Payer: 59 | Source: Home / Self Care

## 2023-02-28 ENCOUNTER — Other Ambulatory Visit: Payer: Self-pay | Admitting: Family Medicine

## 2023-03-03 NOTE — Telephone Encounter (Signed)
Requested medications are due for refill today.  unsure  Requested medications are on the active medications list.  yes  Last refill. 06/30/2022 30g 0 rf  Future visit scheduled.   no  Notes to clinic.  Refill not delegated. Per request pt is not using.     Requested Prescriptions  Pending Prescriptions Disp Refills   clobetasol cream (TEMOVATE) 0.05 % 30 g 0    Sig: Apply 1 Application topically 2 (two) times daily.     Not Delegated - Dermatology:  Corticosteroids Failed - 02/28/2023  7:41 AM      Failed - This refill cannot be delegated      Passed - Valid encounter within last 12 months    Recent Outpatient Visits           11 months ago Anxiety state   [facility], [place]   1 year ago History of deep venous thrombosis (DVT) of distal vein of right lower extremity    [facility], Kasandra Knudsen, [place]   3 years ago Lymphadenopathy   [facility] [place], [place], [place]   3 years ago Need for Tdap vaccination   [facility], Cornelius Moras, RPH-CPP   3 years ago Chronic constipation   [facility] Marcine Matar, MD

## 2023-03-11 ENCOUNTER — Telehealth: Payer: Self-pay | Admitting: Family Medicine

## 2023-03-11 NOTE — Telephone Encounter (Signed)
Pt is calling in because she put in a refill request for clobetasol cream (TEMOVATE) 0.05 % [[id number]] and hasn't heard anything. Pt says she needs the medication and she is actually using it. Pt is requesting someone follow up with her when medication is sent.

## 2023-03-12 ENCOUNTER — Telehealth: Payer: Self-pay | Admitting: Family Medicine

## 2023-03-12 NOTE — Telephone Encounter (Signed)
Medication Refill - Medication: clobetasol cream (TEMOVATE) 0.05 %  Pt called in again for status of refill, says non delegated at first but pt says is using this med  Has the patient contacted their pharmacy? yes (Agent: If no, request that the patient contact the pharmacy for the refill. If patient does not wish to contact the pharmacy document the reason why and proceed with request.) (Agent: If yes, when and what did the pharmacy advise?)contact pcp  Preferred Pharmacy (with phone number or street name):  [facility] Phone: [phone]  Fax: [phone]     Has the patient been seen for an appointment in the last year OR does the patient have an upcoming appointment? yes  Agent: Please be advised that RX refills may take up to 3 business days. We ask that you follow-up with your pharmacy.

## 2023-03-12 NOTE — Telephone Encounter (Addendum)
Routed request on 03/03/23 from the 02/28/23 encounter. Awaiting response from provider. This is the 3rd request for the refill.

## 2023-03-13 ENCOUNTER — Other Ambulatory Visit: Payer: Self-pay | Admitting: *Deleted

## 2023-03-13 ENCOUNTER — Other Ambulatory Visit: Payer: Self-pay

## 2023-03-13 ENCOUNTER — Telehealth: Payer: Self-pay | Admitting: Family Medicine

## 2023-03-13 DIAGNOSIS — L309 Dermatitis, unspecified: Secondary | ICD-10-CM

## 2023-03-13 MED ORDER — CLOBETASOL PROPIONATE 0.05 % EX CREA
1.0000 | TOPICAL_CREAM | Freq: Two times a day (BID) | CUTANEOUS | 0 refills | Status: DC
  Filled 2023-03-13: qty 30, 30d supply, fill #0

## 2023-03-13 NOTE — Telephone Encounter (Signed)
Medication Refill - Medication: Rx #: [id number]  clobetasol cream (TEMOVATE) 0.05 % [[id number]]   4th request. Aware that Dr. Andrey Campanile is not in the office. Is anyone else able to fill the script?  Has the patient contacted their pharmacy? Yes.   (Agent: If no, request that the patient contact the pharmacy for the refill. If patient does not wish to contact the pharmacy document the reason why and proceed with request.) (Agent: If yes, when and what did the pharmacy advise?)  Preferred Pharmacy (with phone number or street name):  [facility] Phone: [phone]  Fax: [phone]     Has the patient been seen for an appointment in the last year OR does the patient have an upcoming appointment? Yes.    Agent: Please be advised that RX refills may take up to 3 business days. We ask that you follow-up with your pharmacy.

## 2023-03-13 NOTE — Telephone Encounter (Signed)
Patient called and advise to make an appointment or the provider may not refill anymore of her medications. - Patient gave verbal understanding

## 2023-03-13 NOTE — Telephone Encounter (Signed)
[facility] called, spoke with Lelon Mast, [facility] about the clobetasol cream, she advised that they had already transferred the rx from [facility] pharmacy and had it ready for the pt. No further assistance needed.

## 2023-07-25 ENCOUNTER — Inpatient Hospital Stay (HOSPITAL_COMMUNITY)
Admission: AD | Admit: 2023-07-25 | Discharge: 2023-07-25 | Disposition: A | Discharge status: Home / Self Care | Payer: 59 | Attending: Obstetrics & Gynecology | Admitting: Obstetrics & Gynecology

## 2023-10-02 ENCOUNTER — Other Ambulatory Visit: Payer: Self-pay

## 2023-10-02 ENCOUNTER — Ambulatory Visit
Admission: EM | Admit: 2023-10-02 | Discharge: 2023-10-02 | Disposition: A | Discharge status: Home / Self Care | Payer: 59 | Attending: Physician Assistant | Admitting: Physician Assistant

## 2023-10-02 ENCOUNTER — Encounter: Payer: Self-pay | Admitting: *Deleted

## 2023-10-02 DIAGNOSIS — Z973 Presence of spectacles and contact lenses: Secondary | ICD-10-CM | POA: Insufficient documentation

## 2023-10-02 DIAGNOSIS — H5789 Other specified disorders of eye and adnexa: Secondary | ICD-10-CM | POA: Diagnosis present

## 2023-10-02 DIAGNOSIS — H1089 Other conjunctivitis: Secondary | ICD-10-CM | POA: Diagnosis not present

## 2023-10-02 DIAGNOSIS — Z113 Encounter for screening for infections with a predominantly sexual mode of transmission: Secondary | ICD-10-CM | POA: Diagnosis not present

## 2023-10-02 DIAGNOSIS — N898 Other specified noninflammatory disorders of vagina: Secondary | ICD-10-CM | POA: Insufficient documentation

## 2023-10-02 DIAGNOSIS — H109 Unspecified conjunctivitis: Secondary | ICD-10-CM | POA: Diagnosis not present

## 2023-10-02 MED ORDER — CIPROFLOXACIN HCL 0.3 % OP SOLN: OPHTHALMIC | 0 refills | Status: DC

## 2023-10-02 NOTE — ED Provider Notes (Signed)
[name]    CSN: [id number] Arrival date & time: 10/02/23  0801      History   Chief Complaint Chief Complaint  Patient presents with   Eye Drainage    HPI Joan Knight is a 37 y.o. female.   Patient presents today with a variety of concerns.  Her primary concern today is right eye redness, drainage, irritation.  She reports that she put in a new contact Christmas Day (2 days ago) by the end of the day she had irritation and foreign body sensation.  She denies any ocular trauma or exposure to chemicals or fine particulate matter.  She did remove the contact but continues to have irritation.  She has not tried any over-the-counter medication for symptom management.  She does have an ophthalmologist but has not seen them recently.  She does report excessive tearing and associated photophobia.  She denies any visual disturbance.  Patient also reports a weeklong history of vaginal irritation.  She has a history of recurrent bacterial vaginosis and is on suppression therapy with her OB/GYN.  She last took metronidazole/Diflucan approximately a week ago.  Denies any changes to personal hygiene products including soaps or detergents.  She has no specific concern for STI but is open to complete testing.  She denies any pelvic pain, abdominal pain, fever, nausea, vomiting.  She has no concern for pregnancy.  In addition, she reports intermittent swelling of her left supraclavicular lymph node.  She has had evaluation by both her primary care and ENT and reports having normal blood work as well as imaging.  She is just concerned because she read on [employer] that lymph nodes in this area can be associated with cancer.    Past Medical History:  Diagnosis Date   Anemia    Anxiety    Depression    DVT (deep venous thrombosis) (HCC)    Had history of DVT in the right lower extremity about 5 years ago.  She tells me she was on blood thinner for about a year and then she stopped  taking it.     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 Authorizing Provider  acetaminophen (TYLENOL) 500 MG tablet Take 1,000 mg by mouth every 6 (six) hours as needed for mild pain, fever or headache.   Yes [provider]  ciprofloxacin (CILOXAN) 0.3 % ophthalmic solution Administer 1 drop, every 2 hours, while awake, for 2 days. Then 1 drop, every 4 hours, while awake, for the next 5 days. 10/02/23  Yes Dresean Beckel K, PA-C  clobetasol cream (TEMOVATE) 0.05 % Apply 1 Application topically 2 (two) times daily. 03/13/23  Yes Georganna Skeans, MD  hydrOXYzine (VISTARIL) 25 MG capsule Take 1 capsule (25 mg total) by mouth at bedtime and may repeat dose one time if needed. 07/24/22  Yes Arfeen, Phillips Grout, MD  ibuprofen (ADVIL) 800 MG tablet Take 1 tablet (800 mg total) by mouth every 8 (eight) hours as needed. 06/09/22  Yes Marylene Land, CNM  omeprazole (PRILOSEC) 40 MG capsule Take 40 mg by mouth daily. 06/07/21  Yes [provider]  sertraline (ZOLOFT) 50 MG tablet Take 50 mg by mouth daily. 05/29/21  Yes [provider]  azithromycin (ZITHROMAX Z-PAK) 250 MG tablet Take 2  tablets initially today, then 1 tablet daily until medication is completed. Patient not taking: Reported on 11/03/2022 10/08/22   Ellsworth Lennox, PA-C  misoprostol (CYTOTEC) 200 MCG tablet Place two pills between cheek and gum on each side and let absorb. Swallow after 30 minutes. Patient not taking: Reported on 11/03/2022 06/19/22   Marylene Land, CNM    Family History Family History  Problem Relation Age of Onset   Asthma Mother    Anxiety disorder Mother    Depression Mother    Bronchitis Mother    Emphysema Mother    COPD Mother    Alcohol abuse Father    Heart disease Father        heart murmur   Hypertension Father    Deep vein thrombosis Father    Depression Father    Heart Problems Father    Diabetes Father    Breast cancer Maternal Aunt    Bone cancer Maternal Grandfather    Congenital heart disease Maternal Grandmother    Pancreatic cancer Neg Hx    Stomach cancer Neg Hx    Esophageal cancer Neg Hx    Colon cancer Neg Hx     Social History Social History   Tobacco Use   Smoking status: Former    Current packs/day: 0.25    Average packs/day: 0.3 packs/day for 16.0 years (4.0 ttl pk-yrs)    Types: Cigarettes   Smokeless tobacco: Never   Tobacco comments:    Cutting back, plans to quit   Vaping Use   Vaping status: Every Day   Substances: Nicotine  Substance Use Topics   Alcohol use: Yes    Comment: occ/social   Drug use: Not Currently    Types: Marijuana    Comment: Stopped once pregnant     Allergies   Patient has no known allergies.   Review of Systems Review of Systems  Constitutional:  Positive for activity change. Negative for appetite change, fatigue and fever.  Eyes:  Positive for photophobia, discharge and redness. Negative for pain, itching and visual disturbance.  Respiratory:  Negative for cough and shortness of breath.   Cardiovascular:  Negative for chest pain.  Gastrointestinal:  Negative for abdominal pain, diarrhea, nausea  and vomiting.  Genitourinary:  Positive for vaginal discharge. Negative for pelvic pain, vaginal bleeding and vaginal pain.  Neurological:  Negative for dizziness, light-headedness and headaches.     Physical Exam Triage Vital Signs ED Triage Vitals  Encounter Vitals Group     BP 10/02/23 0817 117/78     Systolic BP  Percentile --      Diastolic BP Percentile --      Pulse Rate 10/02/23 0817 73     Resp 10/02/23 0817 16     Temp 10/02/23 0817 98.4 F (36.9 C)     Temp Source 10/02/23 0817 Oral     SpO2 10/02/23 0817 95 %     Weight --      Height --      Head Circumference --      Peak Flow --      Pain Score 10/02/23 0813 10     Pain Loc --      Pain Education --      Exclude from Growth Chart --    No data found.  Updated Vital Signs BP 117/78 (BP Location: Left Arm)   Pulse 73   Temp 98.4 F (36.9 C) (Oral)   Resp 16   LMP 09/20/2023   SpO2 95%   Visual Acuity Right Eye Distance: 20/25 Left Eye Distance: 20/25 Bilateral Distance: 20/20 (corrected with glasses)  Right Eye Near:   Left Eye Near:    Bilateral Near:     Physical Exam Vitals reviewed.  Constitutional:      General: She is awake. She is not in acute distress.    Appearance: Normal appearance. She is well-developed. She is not ill-appearing.     Comments: Very pleasant female appears stated age in no acute distress sitting comfortably in exam room  HENT:     Head: Normocephalic and atraumatic.     Nose: Nose normal.  Eyes:     General: Lids are normal.     Extraocular Movements: Extraocular movements intact.     Conjunctiva/sclera:     Right eye: Right conjunctiva is injected. No chemosis.    Left eye: Left conjunctiva is not injected. No chemosis.    Pupils: Pupils are equal, round, and reactive to light.     Right eye: No corneal abrasion or fluorescein uptake. Seidel exam negative.  Cardiovascular:     Rate and Rhythm: Normal rate and regular rhythm.     Heart sounds: Normal heart  sounds, S1 normal and S2 normal. No murmur heard. Pulmonary:     Effort: Pulmonary effort is normal.     Breath sounds: Normal breath sounds. No wheezing, rhonchi or rales.     Comments: Clear to auscultation bilaterally Abdominal:     Palpations: Abdomen is soft.     Tenderness: There is no abdominal tenderness. There is no right CVA tenderness or left CVA tenderness.  Musculoskeletal:     Cervical back: Normal range of motion and neck supple. No spinous process tenderness or muscular tenderness.  Lymphadenopathy:     Upper Body:     Right upper body: No supraclavicular adenopathy.     Left upper body: No supraclavicular adenopathy.  Psychiatric:        Behavior: Behavior is cooperative.      UC Treatments / Results  Labs (all labs ordered are listed, but only abnormal results are displayed) Labs Reviewed  CERVICOVAGINAL ANCILLARY ONLY    EKG   Radiology No results found.  Procedures Procedures (including critical care time)  Medications Ordered in UC Medications - No data to display  Initial Impression / Assessment and Plan / UC Course  I have reviewed the triage vital signs and the nursing notes.  Pertinent labs & imaging results that were available during my care of the patient were reviewed by me and considered  in my medical decision making (see chart for details).     Patient is well-appearing, afebrile, nontoxic, nontachycardic.  No corneal ulcer noted on fluorescein staining.  Suspect bacterial conjunctivitis related to improper hand hygiene when placing the contact lens.  Given she does use contact lenses will cover with fluoroquinolone and she was prescribed ciprofloxacin antibiotic drop.  She can use lubricating eyedrops for additional symptom relief.  She is to avoid using contacts for at least a week after her symptoms resolved to prevent corneal irritation/ulceration.  We discussed that if her symptoms are not improving quickly she should follow-up with  ophthalmology and was given contact information for local provider with instruction to call to schedule appointment.  Discussed if she has any worsening or changing symptoms she needs to be seen emergently including ocular pain or visual disturbance.  Strict return precautions given.  Given she is already being treated for recurrent bacterial vaginosis and took both Diflucan and metronidazole within a week or so we will send off swab to ensure that there is not a another infection causing her symptoms.  STI swab was collected and is pending.  We will contact her to arrange treatment based on these results.  She was encouraged to use hypoallergenic soaps and detergents and wear loosefitting underwear.  Recommended follow-up with her OB/GYN.  If anything worsens and she has pelvic pain, abdominal pain, fever, nausea, vomiting she needs to be seen immediately.  No concerning lymph node findings on exam.  We discussed that it is very uncommon for lymph node associated with malignancy to decrease in size.  It is reassuring that she has had a normal workup.  Recommended follow-up with her primary care/specialist if symptoms persist or change in any way.  Final Clinical Impressions(s) / UC Diagnoses   Final diagnoses:  Bacterial conjunctivitis  Uses contact lenses  Vaginal irritation  Screening examination for STI     Discharge Instructions      Start ciprofloxacin drops as prescribed.  You can use lubricating eyedrops/artificial tears for additional symptom relief.  Do not use contacts for at least 1 week after your symptoms resolve.  Follow-up with ophthalmology if your symptoms are not improving.  If anything worsens and you have recurrent discomfort, vision change you should go to the emergency room.  We will contact you if your swab is abnormal we need to start medication.  Wear loosefitting cotton underwear and use hypoallergenic soaps and detergents.  Avoid sexual contact until you receive your  results.  If anything worsens you have pelvic pain, abdominal pain, fever, nausea, vomiting you need to be seen immediately.  If you have persistent swelling of a lymph node please follow-up with your primary care for additional evaluation.     ED Prescriptions     Medication Sig Dispense Auth. Provider   ciprofloxacin (CILOXAN) 0.3 % ophthalmic solution Administer 1 drop, every 2 hours, while awake, for 2 days. Then 1 drop, every 4 hours, while awake, for the next 5 days. 5 mL Vander Kueker K, PA-C      PDMP not reviewed this encounter.   Jeani Hawking, PA-C 10/02/23 [id number]

## 2023-10-02 NOTE — ED Triage Notes (Addendum)
Pt reports she wore her contacts on Christmas and eyes were irritated and itching. She took them out that day. States right eye has light sensitivity, pain, and some drainage. Also c/o vaginal itching and discharge since 12/12  and would like a referral for a lymph node that is swollen over her collar bone that has been bothering her a while

## 2023-10-02 NOTE — Discharge Instructions (Signed)
Start ciprofloxacin drops as prescribed.  You can use lubricating eyedrops/artificial tears for additional symptom relief.  Do not use contacts for at least 1 week after your symptoms resolve.  Follow-up with ophthalmology if your symptoms are not improving.  If anything worsens and you have recurrent discomfort, vision change you should go to the emergency room.  We will contact you if your swab is abnormal we need to start medication.  Wear loosefitting cotton underwear and use hypoallergenic soaps and detergents.  Avoid sexual contact until you receive your results.  If anything worsens you have pelvic pain, abdominal pain, fever, nausea, vomiting you need to be seen immediately.  If you have persistent swelling of a lymph node please follow-up with your primary care for additional evaluation.

## 2023-10-05 LAB — CERVICOVAGINAL ANCILLARY ONLY
Bacterial Vaginitis (gardnerella): NEGATIVE
Candida Glabrata: NEGATIVE
Candida Vaginitis: NEGATIVE
Chlamydia: NEGATIVE
Comment: NEGATIVE
Comment: NEGATIVE
Comment: NEGATIVE
Comment: NEGATIVE
Comment: NEGATIVE
Comment: NORMAL
Neisseria Gonorrhea: NEGATIVE
Trichomonas: NEGATIVE

## 2023-11-09 ENCOUNTER — Ambulatory Visit
Admission: EM | Admit: 2023-11-09 | Discharge: 2023-11-09 | Disposition: A | Discharge status: Home / Self Care | Payer: No Typology Code available for payment source

## 2023-11-09 DIAGNOSIS — B349 Viral infection, unspecified: Secondary | ICD-10-CM | POA: Diagnosis not present

## 2023-11-09 NOTE — ED Triage Notes (Signed)
Pt presents with fever, cough, body aches, HA x 4 days.  Took OTC cold/flu meds.

## 2023-11-09 NOTE — ED Provider Notes (Signed)
[name]    CSN: [id number] Arrival date & time: 11/09/23  0854      History   Chief Complaint Chief Complaint  Patient presents with   Fever   Cough    HPI Joan Knight is a 38 y.o. female.   Patient complains of a cough congestion.  Patient has bodyaches.  Patient reports she has been running a fever.  Patient reports symptoms began 4 days ago.  Patient reports she is nauseated.  She has not had any vomiting.  Patient complains of stomach cramping.  The history is provided by the patient.  Fever Associated symptoms: cough   Cough Associated symptoms: fever     Past Medical History:  Diagnosis Date   Anemia    Anxiety    Depression    DVT (deep venous thrombosis) (HCC)    Had history of DVT in the right lower extremity about 5 years ago.  She tells me she was on blood thinner for about a year and then she stopped taking it.     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 Authorizing Provider  acetaminophen (TYLENOL) 500 MG tablet Take 1,000 mg by mouth every 6 (six) hours as needed for mild pain, fever or headache.   Yes [provider]  clobetasol  cream (TEMOVATE) 0.05 % Apply 1 Application topically 2 (two) times daily. 03/13/23  Yes Georganna Skeans, MD  hydrOXYzine (VISTARIL) 25 MG capsule Take 1 capsule (25 mg total) by mouth at bedtime and may repeat dose one time if needed. 07/24/22  Yes Arfeen, Phillips Grout, MD  ibuprofen (ADVIL) 800 MG tablet Take 1 tablet (800 mg total) by mouth every 8 (eight) hours as needed. 06/09/22  Yes Marylene Land, CNM  omeprazole (PRILOSEC) 40 MG capsule Take 40 mg by mouth daily. 06/07/21  Yes [provider]  sertraline (ZOLOFT) 50 MG tablet Take 50 mg by mouth daily. 05/29/21  Yes [provider]    Family History Family History  Problem Relation Age of Onset   Asthma Mother    Anxiety disorder Mother    Depression Mother    Bronchitis Mother    Emphysema Mother    COPD Mother    Alcohol abuse Father    Heart disease Father        heart murmur  Hypertension Father    Deep vein thrombosis Father    Depression Father    Heart Problems Father    Diabetes Father    Breast cancer Maternal Aunt    Bone cancer Maternal Grandfather    Congenital heart disease Maternal Grandmother    Pancreatic cancer Neg Hx    Stomach cancer Neg Hx    Esophageal cancer Neg Hx    Colon cancer Neg Hx     Social History Social History   Tobacco Use   Smoking status: Former    Current packs/day: 0.25    Average packs/day: 0.3 packs/day for 16.0 years (4.0 ttl pk-yrs)    Types: Cigarettes   Smokeless tobacco: Never   Tobacco comments:    Cutting back, plans to quit   Vaping Use   Vaping status: Every Day   Substances: Nicotine  Substance Use Topics   Alcohol use: Yes    Comment: occ/social   Drug use: Not Currently    Types: Marijuana    Comment: Stopped once pregnant     Allergies   Patient has no known allergies.   Review of Systems Review of Systems  Constitutional:  Positive for fever.  Respiratory:  Positive for cough.   All other systems reviewed and are  negative.    Physical Exam Triage Vital Signs ED Triage Vitals  Encounter Vitals Group     BP 11/09/23 1142 115/73     Systolic BP Percentile --      Diastolic BP Percentile --      Pulse Rate 11/09/23 1142 83     Resp 11/09/23 1142 18     Temp 11/09/23 1142 98.8 F (37.1 C)     Temp Source 11/09/23 1142 Oral     SpO2 11/09/23 1142 99 %     Weight --      Height --      Head Circumference --      Peak Flow --      Pain Score 11/09/23 1138 10     Pain Loc --      Pain Education --      Exclude from Growth Chart --    No data found.  Updated Vital Signs BP 115/73 (BP Location: Left Arm)   Pulse 83   Temp 98.8 F (37.1 C) (Oral)   Resp 18   LMP 10/22/2023 (Exact Date)   SpO2 99%   Visual Acuity Right Eye Distance:   Left Eye Distance:   Bilateral Distance:    Right Eye Near:   Left Eye Near:    Bilateral Near:     Physical Exam Vitals and nursing note reviewed.  Constitutional:      Appearance: She is well-developed.  HENT:     Head: Normocephalic.  Cardiovascular:     Rate and Rhythm: Normal rate.  Pulmonary:     Effort: Pulmonary effort is normal.  Abdominal:     General: There is no distension.  Musculoskeletal:        General: Normal range of motion.     Cervical back: Normal range of motion.  Skin:    General: Skin is warm.  Neurological:     General: No focal deficit present.     Mental Status: She is alert and oriented to person, place, and time.      UC Treatments / Results  Labs (all labs ordered are listed, but only abnormal results are displayed) Labs Reviewed - No data to display  EKG  Radiology No results found.  Procedures Procedures (including critical care time)  Medications Ordered in UC Medications - No data to display  Initial Impression / Assessment and Plan / UC Course  I have reviewed the triage vital signs and the nursing notes.  Pertinent labs & imaging results that were available during my care of the  patient were reviewed by me and considered in my medical decision making (see chart for details).     I suspect patient has influenza.  She is beyond the 48 hours that I would treat her with Tamiflu.  Patient has been counseled on viral respiratory infection.  Encouraged to increase oral fluids Tylenol as needed patient is given a note to be out of work. Final Clinical Impressions(s) / UC Diagnoses   Final diagnoses:  Viral illness   Discharge Instructions   None    ED Prescriptions   None    PDMP not reviewed this encounter. An After Visit Summary was printed and given to the patient.       Elson Areas, [place] 11/09/23 1209

## 2024-01-03 ENCOUNTER — Ambulatory Visit
Admission: EM | Admit: 2024-01-03 | Discharge: 2024-01-03 | Disposition: A | Discharge status: Home / Self Care | Attending: Physician Assistant | Admitting: Physician Assistant

## 2024-01-03 DIAGNOSIS — Z113 Encounter for screening for infections with a predominantly sexual mode of transmission: Secondary | ICD-10-CM | POA: Diagnosis present

## 2024-01-03 NOTE — ED Triage Notes (Signed)
"  I have been having some vaginal itching and odor for about 3 wks and concerns for BV and STI". "I want swab and blood work to be done if possible".

## 2024-01-03 NOTE — ED Provider Notes (Signed)
 [name]    CSN: [id number] Arrival date & time: 01/03/24  1511      History   Chief Complaint Chief Complaint  Patient presents with   SEXUALLY TRANSMITTED DISEASE    Testing    HPI Joan Knight is a 38 y.o. female.   Patient here today for STD screening.  She reports that she has had some vaginal itching, odor and discharge for about 3 weeks.  She denies any genital lesions or rashes.  She has not had any known exposures to STDs.  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  The history is provided by the patient.    Past Medical History:  Diagnosis Date   Anemia    Anxiety    Depression    DVT (deep venous thrombosis) (HCC)    Had history of DVT in the right lower extremity about 5 years ago.  She tells me she was on blood thinner for about a year and then she stopped taking it.     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 Authorizing Provider  acetaminophen (TYLENOL) 500 MG tablet Take 1,000 mg by mouth every 6 (six) hours as needed for mild pain, fever or headache.    [provider]  clobetasol cream (TEMOVATE) 0.05 % Apply 1 Application topically 2  (two) times daily. 03/13/23   Georganna Skeans, MD  hydrOXYzine (VISTARIL) 25 MG capsule Take 1 capsule (25 mg total) by mouth at bedtime and may repeat dose one time if needed. 07/24/22   Arfeen, Phillips Grout, MD  ibuprofen (ADVIL) 800 MG tablet Take 1 tablet (800 mg total) by mouth every 8 (eight) hours as needed. 06/09/22   Marylene Land, CNM  omeprazole (PRILOSEC) 40 MG capsule Take 40 mg by mouth daily. 06/07/21   [provider]  sertraline (ZOLOFT) 50 MG tablet Take 50 mg by mouth daily. 05/29/21   [provider]    Family History Family History  Problem Relation Age of Onset   Asthma Mother    Anxiety disorder Mother    Depression Mother    Bronchitis Mother    Emphysema Mother    COPD Mother    Alcohol abuse Father    Heart disease Father        heart murmur   Hypertension Father  Deep vein thrombosis Father    Depression Father    Heart Problems Father    Diabetes Father    Breast cancer Maternal Aunt    Bone cancer Maternal Grandfather    Congenital heart disease Maternal Grandmother    Pancreatic cancer Neg Hx    Stomach cancer Neg Hx    Esophageal cancer Neg Hx    Colon cancer Neg Hx     Social History Social History   Tobacco Use   Smoking status: Former    Current packs/day: 0.25    Average packs/day: 0.3 packs/day for 16.0 years (4.0 ttl pk-yrs)    Types: Cigarettes   Smokeless tobacco: Never   Tobacco comments:    Cutting back, plans to quit   Vaping Use   Vaping status: Every Day   Substances: Nicotine, Flavoring  Substance Use Topics   Alcohol use: Yes    Comment: occ/social   Drug use: Not Currently    Types: Marijuana    Comment: Stopped once pregnant     Allergies   Patient has no known allergies.   Review of Systems Review of Systems  Constitutional:  Negative for chills and fever.  Eyes:  Negative for discharge and redness.  Respiratory:  Negative for shortness of breath.   Gastrointestinal:  Negative for  abdominal pain, nausea and vomiting.  Genitourinary:  Positive for vaginal discharge. Negative for genital sores.     Physical Exam Triage Vital Signs ED Triage Vitals  Encounter Vitals Group     BP      Systolic BP Percentile      Diastolic BP Percentile      Pulse      Resp      Temp      Temp src      SpO2      Weight      Height      Head Circumference      Peak Flow      Pain Score      Pain Loc      Pain Education      Exclude from Growth Chart    No data found.  Updated Vital Signs BP 112/73 (BP Location: Left Arm)   Pulse 85   Temp 97.9 F (36.6 C) (Oral)   Resp 18   Ht 5\' 2"  (1.575 m)   Wt 178 lb (80.7 kg)   LMP 12/29/2023 (Exact Date)   SpO2 96%   BMI 32.56 kg/m   Visual Acuity Right Eye Distance:   Left Eye Distance:   Bilateral Distance:    Right Eye Near:   Left Eye Near:    Bilateral Near:     Physical Exam Vitals and nursing note reviewed.  Constitutional:      General: She is not in acute distress.    Appearance: Normal appearance. She is not ill-appearing.  HENT:     Head: Normocephalic and atraumatic.  Eyes:     Conjunctiva/sclera: Conjunctivae normal.  Cardiovascular:     Rate and Rhythm: Normal rate.  Pulmonary:     Effort: Pulmonary effort is normal. No respiratory distress.  Neurological:     Mental Status: She is alert.  Psychiatric:        Mood and Affect: Mood normal.        Behavior: Behavior normal.        Thought Content: Thought content normal.      UC Treatments / Results  Labs (all labs ordered are  listed, but only abnormal results are displayed) Labs Reviewed  RPR  HIV ANTIBODY (ROUTINE TESTING W REFLEX)  CERVICOVAGINAL ANCILLARY ONLY    EKG   Radiology No results found.  Procedures Procedures (including critical care time)  Medications Ordered in UC Medications - No data to display  Initial Impression / Assessment and Plan / UC Course  I have reviewed the triage vital signs and the nursing  notes.  Pertinent labs & imaging results that were available during my care of the patient were reviewed by me and considered in my medical decision making (see chart for details).    Screening ordered for STDs, BV and yeast.  Will await results for further recommendation but encouraged sexual abstinence while awaiting results.  Recommended follow-up if no gradual improvement or with any further concerns.  Final Clinical Impressions(s) / UC Diagnoses   Final diagnoses:  Screening for STD (sexually transmitted disease)   Discharge Instructions   None    ED Prescriptions   None    PDMP not reviewed this encounter.   Tomi Bamberger, PA-C 01/03/24 [phone]

## 2024-01-04 LAB — CERVICOVAGINAL ANCILLARY ONLY
Bacterial Vaginitis (gardnerella): NEGATIVE
Candida Glabrata: NEGATIVE
Candida Vaginitis: NEGATIVE
Chlamydia: NEGATIVE
Comment: NEGATIVE
Comment: NEGATIVE
Comment: NEGATIVE
Comment: NEGATIVE
Comment: NEGATIVE
Comment: NORMAL
Neisseria Gonorrhea: NEGATIVE
Trichomonas: NEGATIVE

## 2024-01-05 LAB — HIV ANTIBODY (ROUTINE TESTING W REFLEX): HIV Screen 4th Generation wRfx: NONREACTIVE

## 2024-01-05 LAB — RPR: RPR Ser Ql: NONREACTIVE

## 2024-01-06 ENCOUNTER — Telehealth (INDEPENDENT_AMBULATORY_CARE_PROVIDER_SITE_OTHER): Payer: Self-pay | Admitting: Primary Care

## 2024-01-06 NOTE — Telephone Encounter (Signed)
 Called pt to confirm appt. Pt will be present.

## 2024-01-12 ENCOUNTER — Telehealth (INDEPENDENT_AMBULATORY_CARE_PROVIDER_SITE_OTHER): Payer: Self-pay | Admitting: Primary Care

## 2024-01-12 NOTE — Telephone Encounter (Signed)
 Called pt to confirm appt. Pt did not answer and VM left.

## 2024-01-13 ENCOUNTER — Other Ambulatory Visit: Payer: Self-pay

## 2024-01-13 ENCOUNTER — Ambulatory Visit (INDEPENDENT_AMBULATORY_CARE_PROVIDER_SITE_OTHER): Admitting: Primary Care

## 2024-01-13 ENCOUNTER — Encounter (INDEPENDENT_AMBULATORY_CARE_PROVIDER_SITE_OTHER): Payer: Self-pay | Admitting: Primary Care

## 2024-01-13 VITALS — BP 108/73 | HR 76 | Resp 16 | Ht 62.0 in | Wt 176.2 lb

## 2024-01-13 DIAGNOSIS — L309 Dermatitis, unspecified: Secondary | ICD-10-CM | POA: Diagnosis not present

## 2024-01-13 DIAGNOSIS — Z0000 Encounter for general adult medical examination without abnormal findings: Secondary | ICD-10-CM | POA: Diagnosis not present

## 2024-01-13 DIAGNOSIS — Z2821 Immunization not carried out because of patient refusal: Secondary | ICD-10-CM | POA: Diagnosis not present

## 2024-01-13 MED ORDER — CLOBETASOL PROPIONATE 0.05 % EX CREA
1.0000 | TOPICAL_CREAM | Freq: Two times a day (BID) | CUTANEOUS | 0 refills | Status: DC
  Filled 2024-01-13: qty 30, 15d supply, fill #0

## 2024-01-13 NOTE — Progress Notes (Signed)
 [facility]  Joan Knight is a 38 y.o. female presents to office today for annual physical exam examination.    Concerns today include: 1. Abnormal labs  Occupation: Market researcher, Marital status: S, Substance use: N Diet: Regular , Exercise: none  Health Maintenance  Topic Date Due   COVID-19 Vaccine (1 - 2024-25 season) Never done   Pneumococcal Vaccine 63-41 Years old (1 of 2 - PCV) 01/12/2025 (Originally 01/12/1993)   INFLUENZA VACCINE  05/06/2024   Colonoscopy  01/31/2026   Cervical Cancer Screening (HPV/Pap Cotest)  11/04/2027   DTaP/Tdap/Td (2 - Td or Tdap) 08/04/2029   Hepatitis C Screening  Completed   HIV Screening  Completed   HPV VACCINES  Aged Out     Past Medical History:  Diagnosis Date   Anemia    Anxiety    Depression    DVT (deep venous thrombosis) (HCC)    Had history of DVT in the right lower extremity about 5 years ago.  She tells me she was on blood thinner for about a year and then she stopped taking it.     Eczema    GERD (gastroesophageal reflux disease)    Nexplanon in place 02/24/2017   Sickle cell anemia (HCC)    Sickle cell trait (HCC)    Vaginal Pap smear, abnormal    "scraped or cut off" (?LEEP) no problems since   Social History   Socioeconomic History   Marital status: Single    Spouse name: Not on file   Number of children: Not on file   Years of education: Not on file   Highest education level: Not on file  Occupational History   Not on file  Tobacco Use   Smoking status: Former    Current packs/day: 0.25    Average packs/day: 0.3 packs/day for 16.0 years (4.0 ttl pk-yrs)    Types: Cigarettes   Smokeless tobacco: Never   Tobacco comments:    Cutting back, plans to quit   Vaping Use   Vaping status: Every Day   Substances: Nicotine, Flavoring  Substance and Sexual Activity   Alcohol use: Yes    Comment: occ/social   Drug use: Not Currently    Types: Marijuana    Comment: Stopped once pregnant    Sexual activity: Yes    Birth control/protection: None  Other Topics Concern   Not on file  Social History Narrative   Not on file   Social Drivers of Health   Financial Resource Strain: Not on file  Food Insecurity: No Food Insecurity (01/13/2024)   Hunger Vital Sign    Worried About Running Out of Food in the Last Year: Never true    Ran Out of Food in the Last Year: Never true  Transportation Needs: No Transportation Needs (01/13/2024)   PRAPARE - Administrator, Civil Service (Medical): No    Lack of Transportation (Non-Medical): No  Physical Activity: Not on file  Stress: Not on file  Social Connections: Not on file  Intimate Partner Violence: Not At Risk (01/13/2024)   Humiliation, Afraid, Rape, and Kick questionnaire    Fear of Current or Ex-Partner: No    Emotionally Abused: No    Physically Abused: No    Sexually Abused: No   Past Surgical History:  Procedure Laterality Date   LEEP     REMOVAL OF IMPLANON ROD  09/26/2019   Family History  Problem Relation Age of Onset   Asthma Mother  Anxiety disorder Mother    Depression Mother    Bronchitis Mother    Emphysema Mother    COPD Mother    Alcohol abuse Father    Heart disease Father        heart murmur   Hypertension Father    Deep vein thrombosis Father    Depression Father    Heart Problems Father    Diabetes Father    Breast cancer Maternal Aunt    Bone cancer Maternal Grandfather    Congenital heart disease Maternal Grandmother    Pancreatic cancer Neg Hx    Stomach cancer Neg Hx    Esophageal cancer Neg Hx    Colon cancer Neg Hx     Current Outpatient Medications:    acetaminophen (TYLENOL) 500 MG tablet, Take 1,000 mg by mouth every 6 (six) hours as needed for mild pain, fever or headache., Disp: , Rfl:    clobetasol cream (TEMOVATE) 0.05 %, Apply 1 Application topically 2 (two) times daily., Disp: 30 g, Rfl: 0   hydrOXYzine (VISTARIL) 25 MG capsule, Take 1 capsule (25 mg total) by  mouth at bedtime and may repeat dose one time if needed., Disp: 30 capsule, Rfl: 1   ibuprofen (ADVIL) 800 MG tablet, Take 1 tablet (800 mg total) by mouth every 8 (eight) hours as needed., Disp: 30 tablet, Rfl: 1   omeprazole (PRILOSEC) 40 MG capsule, Take 40 mg by mouth daily., Disp: , Rfl:    sertraline (ZOLOFT) 50 MG tablet, Take 50 mg by mouth daily., Disp: , Rfl:  Outpatient Encounter Medications as of 01/13/2024  Medication Sig   acetaminophen (TYLENOL) 500 MG tablet Take 1,000 mg by mouth every 6 (six) hours as needed for mild pain, fever or headache.   clobetasol cream (TEMOVATE) 0.05 % Apply 1 Application topically 2 (two) times daily.   hydrOXYzine (VISTARIL) 25 MG capsule Take 1 capsule (25 mg total) by mouth at bedtime and may repeat dose one time if needed.   ibuprofen (ADVIL) 800 MG tablet Take 1 tablet (800 mg total) by mouth every 8 (eight) hours as needed.   omeprazole (PRILOSEC) 40 MG capsule Take 40 mg by mouth daily.   sertraline (ZOLOFT) 50 MG tablet Take 50 mg by mouth daily.   No facility-administered encounter medications on file as of 01/13/2024.    No Known Allergies   ROS: Review of Systems Pertinent items noted in HPI and remainder of comprehensive ROS otherwise negative.     Physical exam: General: Vital signs reviewed.  Patient is well-developed and well-nourished, xx in no acute distress and cooperative with exam. Head: Normocephalic and atraumatic. Eyes: EOMI, conjunctivae normal, no scleral icterus. Neck: Supple, trachea midline, normal ROM, no JVD, masses, thyromegaly, or carotid bruit present. Cardiovascular: RRR, S1 normal, S2 normal, no murmurs, gallops, or rubs. Pulmonary/Chest: Clear to auscultation bilaterally, no wheezes, rales, or rhonchi. Abdominal: Soft, non-tender, non-distended, BS +, no masses, organomegaly, or guarding present. Musculoskeletal: No joint deformities, erythema, or stiffness, ROM full and nontender. Extremities: No lower  extremity edema bilaterally,  pulses symmetric and intact bilaterally. No cyanosis or clubbing. Neurological: A&O x3, Strength is normal Skin: Warm, dry and intact. No rashes or erythema. Psychiatric: Normal mood and affect. speech and behavior is normal. Cognition and memory are normal.      Assessment/ Plan: Joan Knight here for annual physical exam.  Joan Knight was seen today for annual exam.  Diagnoses and all orders for this visit:  Annual physical exam  completed Pneumococcal vaccination declined  Eczema, unspecified type -     clobetasol cream (TEMOVATE) 0.05 %; Apply 1 Application topically 2 (two) times daily.    Counseled on healthy lifestyle choices, including diet (rich in fruits, vegetables and lean meats and low in salt and simple carbohydrates) and exercise (at least 30 minutes of moderate physical activity daily).  Patient to follow up in 1 year for annual exam or sooner if needed.  The above assessment and management plan was discussed with the patient. The patient verbalized understanding of and has agreed to the management plan. Patient is aware to call the clinic if symptoms persist or worsen. Patient is aware when to return to the clinic for a follow-up visit. Patient educated on when it is appropriate to go to the emergency department.   This note has been created with Education officer, environmental. Any transcriptional errors are unintentional.   Marius Siemens, NP 01/13/2024, 9:56 AM

## 2024-01-20 ENCOUNTER — Other Ambulatory Visit: Payer: Self-pay

## 2024-01-21 ENCOUNTER — Other Ambulatory Visit: Payer: Self-pay

## 2024-02-11 ENCOUNTER — Encounter (HOSPITAL_COMMUNITY): Payer: Self-pay

## 2024-02-18 ENCOUNTER — Other Ambulatory Visit (HOSPITAL_COMMUNITY)
Admission: RE | Admit: 2024-02-18 | Discharge: 2024-02-18 | Disposition: A | Discharge status: Home / Self Care | Source: Ambulatory Visit | Attending: Family Medicine | Admitting: Family Medicine

## 2024-02-18 ENCOUNTER — Ambulatory Visit (INDEPENDENT_AMBULATORY_CARE_PROVIDER_SITE_OTHER): Admitting: Family Medicine

## 2024-02-18 VITALS — BP 113/78 | HR 78 | Wt 173.6 lb

## 2024-02-18 DIAGNOSIS — Z113 Encounter for screening for infections with a predominantly sexual mode of transmission: Secondary | ICD-10-CM | POA: Insufficient documentation

## 2024-02-18 DIAGNOSIS — R591 Generalized enlarged lymph nodes: Secondary | ICD-10-CM

## 2024-02-19 ENCOUNTER — Encounter: Payer: Self-pay | Admitting: Family Medicine

## 2024-02-19 LAB — CERVICOVAGINAL ANCILLARY ONLY
Bacterial Vaginitis (gardnerella): POSITIVE — AB
Candida Glabrata: NEGATIVE
Candida Vaginitis: NEGATIVE
Chlamydia: NEGATIVE
Comment: NEGATIVE
Comment: NEGATIVE
Comment: NEGATIVE
Comment: NEGATIVE
Comment: NEGATIVE
Comment: NORMAL
Neisseria Gonorrhea: NEGATIVE
Trichomonas: NEGATIVE

## 2024-02-19 NOTE — Progress Notes (Signed)
 Established Patient Office Visit  Subjective    Patient ID: Joan Knight, female    DOB: October 24, 1985  Age: 38 y.o. MRN: [id number]  CC:  Chief Complaint  Patient presents with   Medical Management of Chronic Issues    Swelling in lymphnodes   Wanting to do blood work      Joint Swelling    HPI Joan Knight presents for complaint of lymphadenopathy. Patient reports that she had recent cbc that showed slightly increased lymphs. She reports that she believes that she has lymph nodes that are intermittent noted by supraclavicular swelling. She has been seen in ED and by several specialists for said sx and it has been unremarkable. She denies fever/chills or viral sx.   Outpatient Encounter Medications as of 02/18/2024  Medication Sig   acetaminophen  (TYLENOL ) 500 MG tablet Take 1,000 mg by mouth every 6 (six) hours as needed for mild pain, fever or headache. (Patient not taking: Reported on 02/18/2024)   clobetasol  cream (TEMOVATE ) 0.05 % Apply 1 Application topically 2 (two) times daily. (Patient not taking: Reported on 02/18/2024)   hydrOXYzine  (VISTARIL ) 25 MG capsule Take 1 capsule (25 mg total) by mouth at bedtime and may repeat dose one time if needed. (Patient not taking: Reported on 02/18/2024)   ibuprofen  (ADVIL ) 800 MG tablet Take 1 tablet (800 mg total) by mouth every 8 (eight) hours as needed. (Patient not taking: Reported on 02/18/2024)   omeprazole  (PRILOSEC) 40 MG capsule Take 40 mg by mouth daily. (Patient not taking: Reported on 02/18/2024)   sertraline  (ZOLOFT ) 50 MG tablet Take 50 mg by mouth daily. (Patient not taking: Reported on 02/18/2024)   No facility-administered encounter medications on file as of 02/18/2024.    Past Medical History:  Diagnosis Date   Anemia    Anxiety    Depression    DVT (deep venous thrombosis) (HCC)    Had history of DVT in the right lower extremity about 5 years ago.  She tells me she was on blood thinner for about a year and then  she stopped taking it.     Eczema    GERD (gastroesophageal reflux disease)    Nexplanon  in place 02/24/2017   Sickle cell anemia (HCC)    Sickle cell trait (HCC)    Vaginal Pap smear, abnormal    "scraped or cut off" (?LEEP) no problems since    Past Surgical History:  Procedure Laterality Date   LEEP     REMOVAL OF IMPLANON  ROD  09/26/2019    Family History  Problem Relation Age of Onset   Asthma Mother    Anxiety disorder Mother    Depression Mother    Bronchitis Mother    Emphysema Mother    COPD Mother    Alcohol abuse Father    Heart disease Father        heart murmur   Hypertension Father    Deep vein thrombosis Father    Depression Father    Heart Problems Father    Diabetes Father    Breast cancer Maternal Aunt    Bone cancer Maternal Grandfather    Congenital heart disease Maternal Grandmother    Pancreatic cancer Neg Hx    Stomach cancer Neg Hx    Esophageal cancer Neg Hx    Colon cancer Neg Hx     Social History   Socioeconomic History   Marital status: Single    Spouse name: Not on file   Number of  children: Not on file   Years of education: Not on file   Highest education level: Associate degree: academic program  Occupational History   Not on file  Tobacco Use   Smoking status: Former    Current packs/day: 0.25    Average packs/day: 0.3 packs/day for 16.0 years (4.0 ttl pk-yrs)    Types: Cigarettes   Smokeless tobacco: Never   Tobacco comments:    Cutting back, plans to quit   Vaping Use   Vaping status: Every Day   Substances: Nicotine , Flavoring  Substance and Sexual Activity   Alcohol use: Yes    Comment: occ/social   Drug use: Not Currently    Types: Marijuana    Comment: Stopped once pregnant   Sexual activity: Yes    Birth control/protection: None  Other Topics Concern   Not on file  Social History Narrative   Not on file   Social Drivers of Health   Financial Resource Strain: High Risk (02/17/2024)   Overall Financial  Resource Strain (CARDIA)    Difficulty of Paying Living Expenses: Hard  Food Insecurity: Food Insecurity Present (02/17/2024)   Hunger Vital Sign    Worried About Running Out of Food in the Last Year: Sometimes true    Ran Out of Food in the Last Year: Sometimes true  Transportation Needs: No Transportation Needs (02/17/2024)   PRAPARE - Administrator, Civil Service (Medical): No    Lack of Transportation (Non-Medical): No  Physical Activity: Sufficiently Active (02/17/2024)   Exercise Vital Sign    Days of Exercise per Week: 5 days    Minutes of Exercise per Session: 150+ min  Stress: Stress Concern Present (02/17/2024)   [employer] of Occupational Health - Occupational Stress Questionnaire    Feeling of Stress : Very much  Social Connections: Moderately Isolated (02/17/2024)   Social Connection and Isolation Panel [NHANES]    Frequency of Communication with Friends and Family: More than three times a week    Frequency of Social Gatherings with Friends and Family: More than three times a week    Attends Religious Services: More than 4 times per year    Active Member of [employer] or Organizations: No    Attends Banker Meetings: Not on file    Marital Status: Never married  Intimate Partner Violence: Not At Risk (01/13/2024)   Humiliation, Afraid, Rape, and Kick questionnaire    Fear of Current or Ex-Partner: No    Emotionally Abused: No    Physically Abused: No    Sexually Abused: No    Review of Systems  All other systems reviewed and are negative.       Objective    BP 113/78 (BP Location: Right Arm, Patient Position: Sitting, Cuff Size: Normal)   Pulse 78   Wt 173 lb 9.6 oz (78.7 kg)   SpO2 93%   BMI 31.75 kg/m   Physical Exam Vitals and nursing note reviewed.  Constitutional:      General: She is not in acute distress. Cardiovascular:     Rate and Rhythm: Normal rate and regular rhythm.  Pulmonary:     Effort: Pulmonary effort is  normal.     Breath sounds: Normal breath sounds.  Lymphadenopathy:     Cervical: No cervical adenopathy.     Upper Body:     Right upper body: No supraclavicular adenopathy.     Left upper body: No supraclavicular adenopathy.  Neurological:     General: No  focal deficit present.     Mental Status: She is alert and oriented to person, place, and time.  Psychiatric:        Mood and Affect: Mood is anxious.         Assessment & Plan:   Lymphadenopathy -     CBC with Differential/Platelet -     Ambulatory referral to Infectious Disease  Screening examination for STD (sexually transmitted disease) -     Cervicovaginal ancillary only -     RPR -     HIV Antibody (routine testing w rflx)     No follow-ups on file.   Arlo Lama, MD

## 2024-02-20 LAB — CBC WITH DIFFERENTIAL/PLATELET
Basophils Absolute: 0.1 10*3/uL (ref 0.0–0.2)
Basos: 1 %
EOS (ABSOLUTE): 0.1 10*3/uL (ref 0.0–0.4)
Eos: 1 %
Hematocrit: 40.9 % (ref 34.0–46.6)
Hemoglobin: 13.2 g/dL (ref 11.1–15.9)
Immature Grans (Abs): 0 10*3/uL (ref 0.0–0.1)
Immature Granulocytes: 0 %
Lymphocytes Absolute: 2.7 10*3/uL (ref 0.7–3.1)
Lymphs: 49 %
MCH: 30.1 pg (ref 26.6–33.0)
MCHC: 32.3 g/dL (ref 31.5–35.7)
MCV: 93 fL (ref 79–97)
Monocytes Absolute: 0.3 10*3/uL (ref 0.1–0.9)
Monocytes: 6 %
Neutrophils Absolute: 2.4 10*3/uL (ref 1.4–7.0)
Neutrophils: 43 %
Platelets: 325 10*3/uL (ref 150–450)
RBC: 4.38 x10E6/uL (ref 3.77–5.28)
RDW: 14.1 % (ref 11.7–15.4)
WBC: 5.5 10*3/uL (ref 3.4–10.8)

## 2024-02-20 LAB — RPR: RPR Ser Ql: NONREACTIVE

## 2024-02-20 LAB — HIV ANTIBODY (ROUTINE TESTING W REFLEX): HIV Screen 4th Generation wRfx: NONREACTIVE

## 2024-02-24 ENCOUNTER — Other Ambulatory Visit: Payer: Self-pay

## 2024-02-24 ENCOUNTER — Ambulatory Visit: Payer: Self-pay | Admitting: Family Medicine

## 2024-02-24 ENCOUNTER — Telehealth: Payer: Self-pay | Admitting: Family Medicine

## 2024-02-24 MED ORDER — METRONIDAZOLE 500 MG PO TABS
500.0000 mg | ORAL_TABLET | Freq: Two times a day (BID) | ORAL | 0 refills | Status: AC
  Filled 2024-02-24: qty 14, 7d supply, fill #0

## 2024-02-24 NOTE — Telephone Encounter (Signed)
Rx was sent today by PCP.

## 2024-02-24 NOTE — Telephone Encounter (Signed)
 Copied from CRM [id number]. Topic: Clinical - Prescription Issue >> Feb 24, 2024 11:10 AM Stanly Early wrote: Reason for CRM: patient stated she was seen last week on 5/15 and was diagnosed with BV.  Patient is wondering if she will be prescribe medication. Patient would also like a callback [phone]

## 2024-02-25 ENCOUNTER — Other Ambulatory Visit: Payer: Self-pay

## 2024-02-25 ENCOUNTER — Other Ambulatory Visit: Payer: Self-pay | Admitting: Family Medicine

## 2024-02-25 MED ORDER — FLUCONAZOLE 150 MG PO TABS
150.0000 mg | ORAL_TABLET | Freq: Once | ORAL | 0 refills | Status: AC
  Filled 2024-02-25: qty 1, 1d supply, fill #0

## 2024-03-02 ENCOUNTER — Ambulatory Visit: Admitting: *Deleted

## 2024-03-02 ENCOUNTER — Other Ambulatory Visit: Payer: Self-pay

## 2024-03-02 DIAGNOSIS — O3680X0 Pregnancy with inconclusive fetal viability, not applicable or unspecified: Secondary | ICD-10-CM

## 2024-03-02 DIAGNOSIS — Z3201 Encounter for pregnancy test, result positive: Secondary | ICD-10-CM | POA: Diagnosis not present

## 2024-03-02 DIAGNOSIS — Z3200 Encounter for pregnancy test, result unknown: Secondary | ICD-10-CM

## 2024-03-02 LAB — POCT PREGNANCY, URINE: Preg Test, Ur: POSITIVE — AB

## 2024-03-02 NOTE — Progress Notes (Signed)
 Established pt submitted urine for pregnancy test which is positive. I called her and informed of test result. She reports sure LMP 01/28/24 which yields EDD 11/03/24, now [redacted]w[redacted]d. Pt denies vaginal bleeding or abdominal pain. She was advised to go to [facility]U if she develops heavy vaginal bleeding or strong abdominal pain. US  for Viability was scheduled on 6/16 @ 8:15 am. She will be scheduled for prenatal care in this office. Pt voiced understanding of all information and instructions given.

## 2024-03-04 ENCOUNTER — Inpatient Hospital Stay (HOSPITAL_COMMUNITY)

## 2024-03-04 ENCOUNTER — Encounter (HOSPITAL_COMMUNITY): Payer: Self-pay | Admitting: *Deleted

## 2024-03-04 ENCOUNTER — Other Ambulatory Visit: Payer: Self-pay

## 2024-03-04 ENCOUNTER — Inpatient Hospital Stay (HOSPITAL_COMMUNITY)
Admission: AD | Admit: 2024-03-04 | Discharge: 2024-03-04 | Disposition: A | Discharge status: Home / Self Care | Attending: Obstetrics and Gynecology | Admitting: Obstetrics and Gynecology

## 2024-03-04 DIAGNOSIS — R109 Unspecified abdominal pain: Secondary | ICD-10-CM | POA: Diagnosis present

## 2024-03-04 DIAGNOSIS — O208 Other hemorrhage in early pregnancy: Secondary | ICD-10-CM | POA: Diagnosis not present

## 2024-03-04 DIAGNOSIS — O26899 Other specified pregnancy related conditions, unspecified trimester: Secondary | ICD-10-CM

## 2024-03-04 DIAGNOSIS — O26891 Other specified pregnancy related conditions, first trimester: Secondary | ICD-10-CM | POA: Diagnosis present

## 2024-03-04 DIAGNOSIS — Z3A01 Less than 8 weeks gestation of pregnancy: Secondary | ICD-10-CM

## 2024-03-04 LAB — CBC WITH DIFFERENTIAL/PLATELET
Abs Immature Granulocytes: 0.04 10*3/uL (ref 0.00–0.07)
Basophils Absolute: 0 10*3/uL (ref 0.0–0.1)
Basophils Relative: 1 %
Eosinophils Absolute: 0.1 10*3/uL (ref 0.0–0.5)
Eosinophils Relative: 1 %
HCT: 33.6 % — ABNORMAL LOW (ref 36.0–46.0)
Hemoglobin: 12.2 g/dL (ref 12.0–15.0)
Immature Granulocytes: 1 %
Lymphocytes Relative: 44 %
Lymphs Abs: 2.5 10*3/uL (ref 0.7–4.0)
MCH: 30.1 pg (ref 26.0–34.0)
MCHC: 36.3 g/dL — ABNORMAL HIGH (ref 30.0–36.0)
MCV: 83 fL (ref 80.0–100.0)
Monocytes Absolute: 0.6 10*3/uL (ref 0.1–1.0)
Monocytes Relative: 10 %
Neutro Abs: 2.4 10*3/uL (ref 1.7–7.7)
Neutrophils Relative %: 43 %
Platelets: 311 10*3/uL (ref 150–400)
RBC: 4.05 MIL/uL (ref 3.87–5.11)
RDW: 13.2 % (ref 11.5–15.5)
WBC: 5.6 10*3/uL (ref 4.0–10.5)
nRBC: 0 % (ref 0.0–0.2)

## 2024-03-04 LAB — COMPREHENSIVE METABOLIC PANEL WITH GFR
ALT: 13 U/L (ref 0–44)
AST: 19 U/L (ref 15–41)
Albumin: 3.7 g/dL (ref 3.5–5.0)
Alkaline Phosphatase: 28 U/L — ABNORMAL LOW (ref 38–126)
Anion gap: 9 (ref 5–15)
BUN: 10 mg/dL (ref 6–20)
CO2: 22 mmol/L (ref 22–32)
Calcium: 9.1 mg/dL (ref 8.9–10.3)
Chloride: 104 mmol/L (ref 98–111)
Creatinine, Ser: 0.81 mg/dL (ref 0.44–1.00)
GFR, Estimated: >60 mL/min (ref >60)
Glucose, Bld: 95 mg/dL (ref 70–99)
Potassium: 3.7 mmol/L (ref 3.5–5.1)
Sodium: 135 mmol/L (ref 135–145)
Total Bilirubin: 0.6 mg/dL (ref 0.0–1.2)
Total Protein: 6.5 g/dL (ref 6.5–8.1)

## 2024-03-04 LAB — HCG, QUANTITATIVE, PREGNANCY: hCG, Beta Chain, Quant, S: 2287 m[IU]/mL — ABNORMAL HIGH (ref <5)

## 2024-03-04 MED ORDER — ENOXAPARIN SODIUM 40 MG/0.4ML IJ SOSY
40.0000 mg | PREFILLED_SYRINGE | INTRAMUSCULAR | 3 refills | Status: AC
  Filled 2024-03-04: qty 12, 30d supply, fill #0

## 2024-03-04 MED ORDER — INSULIN PEN NEEDLE 32G X 6 MM MISC
1.0000 | Freq: Every day | 1 refills | Status: AC
  Filled 2024-03-04: qty 100, 100d supply, fill #0

## 2024-03-04 NOTE — [facility]U Note (Addendum)
 Joan Knight is a 38 y.o. at [redacted]w[redacted]d here in [facility]U reporting: had some cramping yesterday and and last night no pain today. Denies any vag bleeding or discharge.  Currently on metronidazole  for BV  LMP: 01/28/24 Onset of complaint: yesterday Pain score: 0 Vitals:   03/04/24 0803  BP: 106/62  Pulse: 73  Resp: 18  Temp: 98 F (36.7 C)     FHT:   Lab orders placed from triage: u/a

## 2024-03-10 ENCOUNTER — Other Ambulatory Visit: Payer: Self-pay

## 2024-03-10 ENCOUNTER — Other Ambulatory Visit: Payer: Self-pay | Admitting: Physician Assistant

## 2024-03-10 MED ORDER — FLUCONAZOLE 150 MG PO TABS
150.0000 mg | ORAL_TABLET | Freq: Once | ORAL | 0 refills | Status: AC
  Filled 2024-03-10 – 2024-04-04 (×2): qty 1, 1d supply, fill #0

## 2024-03-17 ENCOUNTER — Telehealth: Payer: Self-pay

## 2024-03-17 NOTE — Telephone Encounter (Signed)
 Copied from CRM [id number]. Topic: Clinical - Prescription Issue >> Mar 17, 2024  9:51 AM [name] wrote: Reason for CRM: Patient is calling about prescription for clobetasol  cream (TEMOVATE )0.05% she is pregnant and the obgyn does not want her to use the cream wants to know if there she can be prescribed a cream in replacement. She has used hydrocortisone and it is not working.

## 2024-03-21 ENCOUNTER — Other Ambulatory Visit: Payer: Self-pay

## 2024-03-21 ENCOUNTER — Encounter: Payer: Self-pay | Admitting: Family Medicine

## 2024-03-21 ENCOUNTER — Ambulatory Visit: Admitting: Family Medicine

## 2024-03-21 ENCOUNTER — Ambulatory Visit

## 2024-03-21 VITALS — BP 106/70 | HR 91

## 2024-03-21 DIAGNOSIS — O020 Blighted ovum and nonhydatidiform mole: Secondary | ICD-10-CM | POA: Diagnosis not present

## 2024-03-21 DIAGNOSIS — O3680X0 Pregnancy with inconclusive fetal viability, not applicable or unspecified: Secondary | ICD-10-CM | POA: Diagnosis not present

## 2024-03-21 DIAGNOSIS — Z3A00 Weeks of gestation of pregnancy not specified: Secondary | ICD-10-CM | POA: Diagnosis not present

## 2024-03-21 DIAGNOSIS — Z3687 Encounter for antenatal screening for uncertain dates: Secondary | ICD-10-CM | POA: Diagnosis not present

## 2024-03-21 DIAGNOSIS — O039 Complete or unspecified spontaneous abortion without complication: Secondary | ICD-10-CM

## 2024-03-21 MED ORDER — ONDANSETRON 4 MG PO TBDP
4.0000 mg | ORAL_TABLET | Freq: Three times a day (TID) | ORAL | 0 refills | Status: DC | PRN
  Filled 2024-03-21 – 2024-04-04 (×2): qty 20, 7d supply, fill #0

## 2024-03-21 MED ORDER — OXYCODONE HCL 5 MG PO TABS
5.0000 mg | ORAL_TABLET | ORAL | 0 refills | Status: DC | PRN
  Filled 2024-03-21 – 2024-04-04 (×2): qty 10, 2d supply, fill #0

## 2024-03-21 MED ORDER — OXYCODONE HCL 5 MG PO TABS
5.0000 mg | ORAL_TABLET | ORAL | 0 refills | Status: DC | PRN
  Filled 2024-03-21: qty 30, 5d supply, fill #0

## 2024-03-21 MED ORDER — MISOPROSTOL 200 MCG PO TABS
800.0000 ug | ORAL_TABLET | Freq: Once | ORAL | 0 refills | Status: DC
  Filled 2024-03-21 – 2024-04-04 (×2): qty 4, 1d supply, fill #0

## 2024-03-21 NOTE — Assessment & Plan Note (Signed)
 Patient with diagnosis of anembryonic pregnancy.  Meets definitive criteria for failed pregnancy given absence of fetal pole with cardiac activity greater than 14 days after ultrasound performed showing gestational sac with no yolk sac or fetal pole.  Discussed conservative management versus medications versus surgical.  Patient elected for medical management.  Prescription sent for 800 mcg of Cytotec  oral with 1 refill.  Also short course of oxycodone  and Zofran  also sent.  Discussed MAU precautions.  No further questions or concerns

## 2024-03-21 NOTE — Progress Notes (Signed)
    Subjective:  Joan Knight is a 38 y.o. female who presents to the clinic today for viability ultrasound  HPI: Patient is a 38 year old G3, P0 with history of 2 miscarriages.  Was seen in the MAU on 5/30 and ultrasound was completed showing gestational sac with no yolk sac/fetal pole.  Patient reports she has had no bleeding, abdominal pain since that time.  Has concerns regarding viability of the pregnancy but no other issues.  Objective:  Physical Exam: BP 106/70   Pulse 91   LMP 01/28/2024   Gen: Alert NAD, resting comfortably CV: Regular rate Pulm: Normal work of breathing, speaking in full sentences MSK: No gross abnormalities Skin: warm, dry Neuro: grossly normal, moves all extremities Psych: Normal affect and thought content  No results found for this or any previous visit (from the past 72 hours).   Assessment/Plan:  Anembryonic pregnancy Patient with diagnosis of anembryonic pregnancy.  Meets definitive criteria for failed pregnancy given absence of fetal pole with cardiac activity greater than 14 days after ultrasound performed showing gestational sac with no yolk sac or fetal pole.  Discussed conservative management versus medications versus surgical.  Patient elected for medical management.  Prescription sent for 800 mcg of Cytotec  oral with 1 refill.  Also short course of oxycodone  and Zofran  also sent.  Discussed MAU precautions.  No further questions or concerns   Lab Orders  No laboratory test(s) ordered today    Meds ordered this encounter  Medications   misoprostol  (CYTOTEC ) 200 MCG tablet    Sig: Take 4 tablets (800 mcg total) by mouth once for 1 dose.    Dispense:  4 tablet    Refill:  0   DISCONTD: oxyCODONE  (ROXICODONE ) 5 MG immediate release tablet    Sig: Take 1 tablet (5 mg total) by mouth every 4 (four) hours as needed for severe pain (pain score 7-10).    Dispense:  30 tablet    Refill:  0   ondansetron  (ZOFRAN -ODT) 4 MG disintegrating  tablet    Sig: Take 1 tablet (4 mg total) by mouth every 8 (eight) hours as needed for nausea or vomiting.    Dispense:  20 tablet    Refill:  0   oxyCODONE  (ROXICODONE ) 5 MG immediate release tablet    Sig: Take 1 tablet (5 mg total) by mouth every 4 (four) hours as needed for severe pain (pain score 7-10).    Dispense:  10 tablet    Refill:  0      Janna Melter, MD Attending Family Medicine Physician, [facility] for [facility], [facility]   03/21/24 11:14 AM

## 2024-03-23 NOTE — Telephone Encounter (Signed)
 FYI  Called patient to go over information regarding medication, She did inform to disregard it due to her having a miscarriage.

## 2024-03-30 ENCOUNTER — Other Ambulatory Visit: Payer: Self-pay

## 2024-04-04 ENCOUNTER — Other Ambulatory Visit: Payer: Self-pay

## 2024-04-06 ENCOUNTER — Other Ambulatory Visit: Payer: Self-pay

## 2024-04-12 ENCOUNTER — Encounter: Payer: Self-pay | Admitting: Family Medicine

## 2024-04-13 ENCOUNTER — Telehealth

## 2024-04-19 NOTE — Progress Notes (Unsigned)
 GYNECOLOGY OFFICE VISIT NOTE  History:  Joan Knight is a 38 y.o. G3P0030 here today for follow up after medical management of SAB. She was diagnosed early based on serial US . She was given misoprostol  on 6/16. She thinks she passed eveyrthing but unsure.   Since that time she has had pain in vagina and abdomen daily. She is not bleeding currently. Has not yet had a period. Having some discharge - sometimes white and sometimes yellow.   Diflucan  did help the symptoms but not completely.   Previously felt like a fever on Monday and had emesis but while at work, but might have been situational.   She also reports heavy periods since her miscarriages in general. They come and last 7-10 days. She bleeds through period underwear. She doesn't want birth control, but is concerned she cannot stay pregnant. She is also unsure if she wants to get pregnant because of how hard emotionally it is to get pregnant.     Past Medical History:  Diagnosis Date   Anemia    Anxiety    Depression    DVT (deep venous thrombosis) (HCC)    Had history of DVT in the right lower extremity about 5 years ago.  She tells me she was on blood thinner for about a year and then she stopped taking it.     Eczema    GERD (gastroesophageal reflux disease)    Nexplanon  in place 02/24/2017   Sickle cell anemia (HCC)    Sickle cell trait (HCC)    Vaginal Pap smear, abnormal    scraped or cut off (?LEEP) no problems since    Past Surgical History:  Procedure Laterality Date   LEEP     REMOVAL OF IMPLANON  ROD  09/26/2019    The following portions of the patient's history were reviewed and updated as appropriate: allergies, current medications, past family history, past medical history, past social history, past surgical history and problem list.   Health Maintenance:   Diagnosis  Date Value Ref Range Status  11/03/2022   Final   - Negative for Intraepithelial Lesions or Malignancy (NILM)  11/03/2022 -  Benign reactive/reparative changes  Final   Review of Systems:  Pertinent items noted in HPI and remainder of comprehensive ROS otherwise negative.  Physical Exam:  BP 121/84   Pulse 74   Ht 5' 2 (1.575 m)   Wt 177 lb 6.4 oz (80.5 kg)   LMP 01/28/2024   Breastfeeding Unknown   BMI 32.45 kg/m  CONSTITUTIONAL: Well-developed, well-nourished female in no acute distress.  HEENT:  Normocephalic, atraumatic. External right and left ear normal. No scleral icterus.  NECK: Normal range of motion, supple, no masses noted on observation SKIN: No rash noted. Not diaphoretic. No erythema. No pallor. MUSCULOSKELETAL: Normal range of motion. No edema noted. NEUROLOGIC: Alert and oriented to person, place, and time. Normal muscle tone coordination. No cranial nerve deficit noted. PSYCHIATRIC: Normal mood and affect. Normal behavior. Normal judgment and thought content.  PELVIC: Deferred   Assessment and Plan:  Joan Knight was seen today for follow-up.  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 Prior genetics not done - has had SAB at home.  - Given recurrent losses, will initiate workup with sonohystogram, TFTs, APS testing - Normal APS in January 2024 but would recheck since over a year and another SAB.  - Will send note to [facility] to notify of need for [facility].  - Genetics referral placed.  - All questions answered  -     Beta-2  Glycoprotein I Ab,G/M -     Cardiolipin antibodies, IgM+IgG -     Lupus anticoagulant -     AMB MFM GENETICS REFERRAL -     US  Sonohysterogram; Future  STI (sexually transmitted infection) -      RPR -     HIV Antibody (routine testing w rflx) -     Hepatitis C antibody     No orders of the defined types were placed in this encounter.    Routine preventative health maintenance measures emphasized. Please refer to After Visit Summary for other counseling recommendations.   No follow-ups on file.  Joan Solian, MD, FACOG Obstetrician & Gynecologist, [facility], [facility]

## 2024-04-20 ENCOUNTER — Other Ambulatory Visit: Payer: Self-pay

## 2024-04-20 ENCOUNTER — Encounter: Payer: Self-pay | Admitting: Obstetrics and Gynecology

## 2024-04-20 ENCOUNTER — Other Ambulatory Visit (HOSPITAL_COMMUNITY)
Admission: RE | Admit: 2024-04-20 | Discharge: 2024-04-20 | Disposition: A | Discharge status: Home / Self Care | Source: Ambulatory Visit | Attending: Obstetrics and Gynecology | Admitting: Obstetrics and Gynecology

## 2024-04-20 ENCOUNTER — Ambulatory Visit (INDEPENDENT_AMBULATORY_CARE_PROVIDER_SITE_OTHER): Payer: Self-pay | Admitting: Obstetrics and Gynecology

## 2024-04-20 VITALS — BP 121/84 | HR 74 | Ht 62.0 in | Wt 177.4 lb

## 2024-04-20 DIAGNOSIS — Z3A00 Weeks of gestation of pregnancy not specified: Secondary | ICD-10-CM

## 2024-04-20 DIAGNOSIS — A64 Unspecified sexually transmitted disease: Secondary | ICD-10-CM | POA: Diagnosis not present

## 2024-04-20 DIAGNOSIS — N96 Recurrent pregnancy loss: Secondary | ICD-10-CM

## 2024-04-20 DIAGNOSIS — R109 Unspecified abdominal pain: Secondary | ICD-10-CM | POA: Diagnosis present

## 2024-04-20 DIAGNOSIS — O039 Complete or unspecified spontaneous abortion without complication: Secondary | ICD-10-CM | POA: Diagnosis not present

## 2024-04-21 ENCOUNTER — Ambulatory Visit: Payer: Self-pay | Admitting: Obstetrics and Gynecology

## 2024-04-21 LAB — RPR: RPR Ser Ql: NONREACTIVE

## 2024-04-21 LAB — CERVICOVAGINAL ANCILLARY ONLY
Bacterial Vaginitis (gardnerella): NEGATIVE
Candida Glabrata: NEGATIVE
Candida Vaginitis: NEGATIVE
Chlamydia: NEGATIVE
Comment: NEGATIVE
Comment: NEGATIVE
Comment: NEGATIVE
Comment: NEGATIVE
Comment: NEGATIVE
Comment: NORMAL
Neisseria Gonorrhea: NEGATIVE
Trichomonas: NEGATIVE

## 2024-04-21 LAB — BETA HCG QUANT (REF LAB): hCG Quant: <1 m[IU]/mL

## 2024-04-21 LAB — HEPATITIS C ANTIBODY: Hep C Virus Ab: NONREACTIVE

## 2024-04-21 LAB — HIV ANTIBODY (ROUTINE TESTING W REFLEX): HIV Screen 4th Generation wRfx: NONREACTIVE

## 2024-04-22 ENCOUNTER — Encounter: Payer: Self-pay | Admitting: Advanced Practice Midwife

## 2024-04-22 LAB — URINE CULTURE

## 2024-04-28 ENCOUNTER — Ambulatory Visit: Attending: Obstetrics and Gynecology

## 2024-05-09 ENCOUNTER — Ambulatory Visit

## 2024-05-09 ENCOUNTER — Ambulatory Visit: Attending: Obstetrics and Gynecology

## 2024-05-09 DIAGNOSIS — N96 Recurrent pregnancy loss: Secondary | ICD-10-CM | POA: Diagnosis not present

## 2024-05-09 DIAGNOSIS — Z31430 Encounter of female for testing for genetic disease carrier status for procreative management: Secondary | ICD-10-CM

## 2024-05-09 DIAGNOSIS — D573 Sickle-cell trait: Secondary | ICD-10-CM

## 2024-05-09 NOTE — Progress Notes (Signed)
 [facility] at [facility] [address] Phone:  [phone]   Fax:  [phone]      In-Person Genetic Counseling Clinic Note:   I spoke with 38 y.o. Joan Knight today to discuss recurrent pregnancy loss. She was referred by Cleatus Moccasin, MD.   Pregnancy History:    [id number]. Patient had a recent SAB in 03/2024 and two previous ones in 03/2021 and 06/2022. No known underlying genetic or health causes. Reports she takes vitamins and blood thinner injections. Reports she does not currently take PNVs. Discussed folic acid intake as recommended by OB, typically 400 mcg daily at least 1-3 months before pregnancy and during the first 12 weeks of pregnancy if there is no personal/family history of NTDs. Personal history of DVT and sickle cell trait. Denies other major personal health concerns. Denies using tobacco, alcohol, or street drugs in this pregnancy.   Family History:    A three-generation pedigree was created and scanned into Epic under the Media tab.  The first two miscarriages were with the patient's ex-fiance. The recent miscarriage was with patient's boyfriend. Boyfriend's history not obtained as relationship may not continue per patient.  Patient reports she has sickle cell trait. Some of her provider notes state she has sickle cell anemia. We reviewed carrier screening can clarify her sickle cell status.  Maternal ethnicity reported as Black. Denies Ashkenazi Jewish ancestry.  Family history not remarkable for consanguinity, individuals with birth defects, intellectual disability, autism spectrum disorder, multiple spontaneous abortions, still births, or unexplained neonatal death.  Recurrent Pregnancy Loss:  Recurrent pregnancy loss (RPL) is defined as two or more clinically recognized pregnancy losses occurring prior to [redacted] weeks gestation. Fewer than 5% of couples have two miscarriages in a row, and only 1% will  experience three or more. Around 50% of individuals experiencing RPL will have an identifiable etiology to their history. Pregnancy losses occur for many reasons, including endocrine factors (i.e. uncontrolled diabetes mellitus, luteal phase deficiency, thyroid disease, or polycystic ovarian syndrome), environmental factors, immunologic causes (i.e. antiphospholipid syndrome), maternal factors (i.e. uterine anatomic malformations, cervical abnormalities), as well as chromosomal and single-gene disorders. The overall rate of recognized pregnancy loss is approximately 20%, with the majority occurring in the first trimester. After 28 weeks of pregnancy, the incidence is about 3-6% and attributed causes include placental problems (i.e. abruption, infarction), maternal causes, and congenital malformations.   Chromosomal causes account for about 50%-70% of first trimester losses but are less commonly cited as a cause for late gestational pregnancy loss. Most chromosome differences are a result of a random change in the sperm or egg that conceived the fetus; however, 2-5% of all couples with RPL will be identified with a balanced chromosome translocation. A translocation occurs when there is an exchange in chromosome material, such as when a segment of one chromosome breaks off and reattaches to a different chromosome. When the translocation is balanced, there is no extra or missing genetic material; however, an individual who carries a balanced translocation has an increased risk to pass unbalanced chromosomes to their offspring. In couples in which one partner is a carrier of a balanced translocation, this can result in an increased risk for infertility, RPL, and offspring with abnormal chromosomes. Chromosome translocations can be detected by karyotype.  Single gene conditions can also be associated with RPL, but are less likely causes of early pregnancy loss compared with chromosomal differences. Single gene  conditions can  be more likely associated with later gestation losses. Certain autosomal recessive and X-linked conditions can be lethal in fetuses. If each member of a couple is a carrier for an autosomal recessive condition, there is a 25% chance the fetus would be affected with the condition. Certain X-linked conditions may be lethal in female fetuses. These can be inherited from unaffected or slightly affected mothers. We discussed and offered carrier screening per the ACOG Committee Opinion 691 as well as expanded carrier screening for autosomal recessive and X-linked conditions. The technical aspects, benefits, risks, and limitations of carrier screening were discussed. We reviewed that if the patient is found to be a carrier for a genetic condition, carrier screening is available for the partner. If both are found to be carriers for the same condition, there is a 25% chance of the pregnancy to be affected.  Due to the patient's history of RPL, chromosome analysis by karyotyping and an expanded carrier screen involving 613 genetic conditions was offered to [place]. This testing may help to inform the cause of the previous pregnancy losses, and may help the patient make informed reproductive decisions moving forward.  Available Genetic Screening/Testing Options for Future Pregnancy:   We reviewed available genetic screening and testing options for a future pregnancy, including noninvasive prenatal screening (NIPS) and diagnostic testing. We discussed the technical aspects, risks, benefits, and limitations of genetic screening and diagnostic testing. We discussed age-related risks for chromosome differences in a fetus that increases with maternal age. At [facility] age, the chance of a future pregnancy to be affected with a chromosome difference is ~1%.   Plan of Care:   Blood drawn today for karyotype and expanded carrier screening.  ADDENDUM 05/12/2024: I spoke with the patient to inform her that she  already had a karyotype performed last year. 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 No structural rearrangements were detected that increase the risk of infertility or recurrent pregnancy loss. Please see report for details. The repeat karyotype order will therefore be cancelled. Carrier screening is still pending.   Informed consent was obtained. All questions were answered.   45 minutes were spent on the date of the encounter in service to the patient including preparation, face-to-face consultation, discussion of test reports and available next steps, pedigree construction, genetic risk assessment, documentation, and care coordination.    Thank you for sharing in the care of Joan Knight with us .  Please do not hesitate to contact us  at [phone] if you have any questions.   Lauraine Bodily, MS, [facility] Certified Genetic Counselor   Genetic counseling student involved in appointment: No.

## 2024-05-12 ENCOUNTER — Encounter (HOSPITAL_BASED_OUTPATIENT_CLINIC_OR_DEPARTMENT_OTHER): Payer: Self-pay | Admitting: *Deleted

## 2024-05-12 ENCOUNTER — Telehealth: Payer: Self-pay

## 2024-05-12 DIAGNOSIS — Z148 Genetic carrier of other disease: Secondary | ICD-10-CM

## 2024-05-12 LAB — REQUEST PROBLEM

## 2024-05-16 ENCOUNTER — Ambulatory Visit: Payer: Self-pay | Admitting: Emergency Medicine

## 2024-05-16 ENCOUNTER — Ambulatory Visit
Admission: EM | Admit: 2024-05-16 | Discharge: 2024-05-16 | Disposition: A | Discharge status: Home / Self Care | Attending: Emergency Medicine | Admitting: Emergency Medicine

## 2024-05-16 ENCOUNTER — Encounter: Payer: Self-pay | Admitting: Emergency Medicine

## 2024-05-16 DIAGNOSIS — J0100 Acute maxillary sinusitis, unspecified: Secondary | ICD-10-CM | POA: Diagnosis not present

## 2024-05-16 DIAGNOSIS — Z86718 Personal history of other venous thrombosis and embolism: Secondary | ICD-10-CM

## 2024-05-16 DIAGNOSIS — Z20822 Contact with and (suspected) exposure to covid-19: Secondary | ICD-10-CM

## 2024-05-16 LAB — POC COVID19/FLU A&B COMBO
Covid Antigen, POC: NEGATIVE
Influenza A Antigen, POC: NEGATIVE
Influenza B Antigen, POC: NEGATIVE

## 2024-05-16 LAB — POCT URINE PREGNANCY: Preg Test, Ur: NEGATIVE

## 2024-05-16 MED ORDER — IPRATROPIUM BROMIDE 0.06 % NA SOLN: 2.0000 | Freq: Four times a day (QID) | NASAL | 0 refills | Status: AC

## 2024-05-16 MED ORDER — PROMETHAZINE-DM 6.25-15 MG/5ML PO SYRP: 5.0000 mL | ORAL_SOLUTION | Freq: Four times a day (QID) | ORAL | 0 refills | Status: DC | PRN

## 2024-05-16 MED ORDER — AMOXICILLIN-POT CLAVULANATE 875-125 MG PO TABS: 1.0000 | ORAL_TABLET | Freq: Two times a day (BID) | ORAL | 0 refills | Status: DC

## 2024-05-16 NOTE — Discharge Instructions (Addendum)
 Start Mucinex -D to keep the mucous thin and to decongest you.   You may take 600 mg of motrin  with 1000 mg of tylenol  up to 3-4 times a day as needed for pain. This is an effective combination for pain.  Finish the Augmentin , even if you feel better.  Atrovent  nasal spray will help with the nasal congestion, postnasal drip.  1000 mg of Tylenol  3 or 4 times a day as needed for pain.  Promethazine  DM for cough.  Use a NeilMed sinus rinse with distilled water as often as you want to to reduce nasal congestion. Follow the directions on the box.   Will contact you if your urine pregnancy, COVID or flu come back positive.  Follow-up with your primary care provider ASAP about being transitioned to an oral anticoagulant from the Lovenox .  I would restart the Lovenox  for now.  Go to the ER for any signs and symptoms of worsening blood clot in your leg, or blood clot in your lungs as we discussed.  Go to www.goodrx.com to look up your medications. This will give you a list of where you can find your prescriptions at the most affordable prices. Or you can ask the pharmacist what the cash price is. This is frequently cheaper than going through insurance.

## 2024-05-16 NOTE — ED Provider Notes (Signed)
 HPI  SUBJECTIVE:  Joan Knight is a 38 y.o. female who presents with 4 days of nasal congestion, rhinorrhea, sinus pain and pressure, cough productive of yellowish-green sputum, headache, dental pain, sore throat, postnasal drip and feeling feverish.  No documented fevers, body aches, facial swelling, wheezing, chest pain or shortness of breath, dyspnea on exertion.  No nausea, vomiting, diarrhea, abdominal pain, rash.  She is unable to sleep at night because of the cough.  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  No antibiotics in the past 3 months.  No antipyretic in the past 6 hours.  She has tried warm salt water gargles, ginger/peppermint tea with improvement in her symptoms.  No aggravating factors.  She has a past medical history of DVT, sickle cell disease. She also states that she has continued right calf pain and swelling, but this has not changed recently.  She discontinued her Lovenox  without medical supervision 1 month ago after having a miscarriage.  Denies chest pain, shortness of breath, dyspnea on exertion.   LMP: July 22.  Not sure if she could be pregnant.  PCP: [facility].   Past Medical History:  Diagnosis Date   Anemia    Anxiety    Depression    DVT (deep venous thrombosis) (HCC)    Had history of DVT in the right lower extremity about 5 years ago.  She tells me she was on blood thinner for about a year and then she stopped taking it.     Eczema    GERD (gastroesophageal reflux disease)    Sickle cell anemia (HCC)    Sickle cell trait (HCC)    Vaginal Pap smear, abnormal    scraped or cut off (?LEEP) no problems since    Past Surgical History:  Procedure Laterality Date   LEEP     REMOVAL OF IMPLANON  ROD  09/26/2019    Family History  Problem Relation Age of Onset   Asthma Mother    Anxiety disorder Mother    Depression Mother    Bronchitis Mother    Emphysema Mother    COPD Mother    Alcohol abuse  Father    Heart disease Father        heart murmur   Hypertension Father    Deep vein thrombosis Father    Depression Father    Heart Problems Father    Diabetes Father    Breast cancer Maternal Aunt    Bone cancer Maternal Grandfather    Congenital heart disease Maternal Grandmother    Pancreatic cancer Neg Hx    Stomach cancer Neg Hx    Esophageal cancer Neg Hx    Colon cancer Neg Hx     Social History   Tobacco Use   Smoking status: Former    Current packs/day: 0.25    Average packs/day: 0.3 packs/day for 16.0 years (4.0 ttl pk-yrs)    Types: Cigarettes   Smokeless tobacco: Never   Tobacco comments:    Cutting back, plans to quit   Vaping Use   Vaping status: Every Day   Substances: Nicotine , Flavoring  Substance Use Topics   Alcohol use: Yes    Comment: occ/social   Drug use: Not Currently    Types: Marijuana    Comment: Stopped once pregnant    No current facility-administered medications for this encounter.  Current Outpatient Medications:    amoxicillin -clavulanate (AUGMENTIN ) 875-125 MG tablet, Take 1 tablet  by mouth every 12 (twelve) hours., Disp: 14 tablet, Rfl: 0   ipratropium (ATROVENT ) 0.06 % nasal spray, Place 2 sprays into both nostrils 4 (four) times daily., Disp: 15 mL, Rfl: 0   omeprazole  (PRILOSEC) 40 MG capsule, Take 40 mg by mouth daily., Disp: , Rfl:    promethazine -dextromethorphan (PROMETHAZINE -DM) 6.25-15 MG/5ML syrup, Take 5 mLs by mouth 4 (four) times daily as needed for cough., Disp: 118 mL, Rfl: 0   acetaminophen  (TYLENOL ) 500 MG tablet, Take 1,000 mg by mouth every 6 (six) hours as needed for mild pain, fever or headache. (Patient not taking: Reported on 04/20/2024), Disp: , Rfl:    enoxaparin  (LOVENOX ) 40 MG/0.4ML injection, Inject 0.4 mLs (40 mg total) into the skin daily. (Patient not taking: Reported on 05/16/2024), Disp: 12 mL, Rfl: 3   hydrOXYzine  (VISTARIL ) 25 MG capsule, Take 1 capsule (25 mg total) by mouth at bedtime and may repeat  dose one time if needed. (Patient not taking: Reported on 04/20/2024), Disp: 30 capsule, Rfl: 1   Insulin  Pen Needle 32G X 6 MM MISC, Use daily. (Patient not taking: Reported on 05/16/2024), Disp: 100 each, Rfl: 1   sertraline  (ZOLOFT ) 50 MG tablet, Take 50 mg by mouth daily. (Patient not taking: Reported on 03/02/2024), Disp: , Rfl:   No Known Allergies   ROS  As noted in HPI.   Physical Exam  BP 106/60 (BP Location: Left Arm)   Pulse 89   Temp 98.1 F (36.7 C) (Oral)   Resp 18   LMP 04/26/2024 (Exact Date)   SpO2 98%   Breastfeeding No   Constitutional: Well developed, well nourished, no acute distress Eyes: PERRL, EOMI, conjunctiva normal bilaterally HENT: Normocephalic, atraumatic,mucus membranes moist. Erythematous, swollen turbinates.  Positive maxillary sinus tenderness.  Tonsils normal size without exudates.  Positive cobblestoning and postnasal drip. Neck: Positive cervical lymphadenopathy Respiratory: Clear to auscultation bilaterally, no rales, no wheezing, no rhonchi Cardiovascular: Normal rate and rhythm, no murmurs, no gallops, no rubs GI: nondistended skin: No rash, skin intact Musculoskeletal: no deformities.  Calves symmetric.  Mild posterior calf tenderness.  No palpable cord.  No tenderness along the greater saphenous vein where patient states her DVT was. Neurologic: Alert & oriented x 3, CN III-XII grossly intact, no motor deficits, sensation grossly intact Psychiatric: Speech and behavior appropriate   ED Course   Medications - No data to display  Orders Placed This Encounter  Procedures   POC Covid19/Flu A&B Antigen    Standing Status:   Standing    Number of Occurrences:   1   POCT urine pregnancy    Standing Status:   Standing    Number of Occurrences:   1   Results for orders placed or performed during the hospital encounter of 05/16/24 (from the past 24 hours)  POC Covid19/Flu A&B Antigen     Status: None   Collection Time: 05/16/24  5:07 PM   Result Value Ref Range   Influenza A Antigen, POC Negative Negative   Influenza B Antigen, POC Negative Negative   Covid Antigen, POC Negative Negative  POCT urine pregnancy     Status: Normal   Collection Time: 05/16/24  5:15 PM  Result Value Ref Range   Preg Test, Ur Negative Negative   No results found.  ED Clinical Impression  1. Acute non-recurrent maxillary sinusitis   2. Encounter for laboratory testing for COVID-19 virus   3. History of DVT of lower extremity      ED Assessment/Plan  1.  Upper respiratory infection/acute maxillary sinusitis.  She meets criteria for antibiotic treatment based on severe symptoms of upper dental pain.  Will send home with Augmentin  for 7 days, Atrovent  nasal spray, Tylenol , Mucinex  D, saline nasal irrigation.  Benadryl /Maalox mixture for the sore throat, Promethazine  DM.  Check COVID/flu, she declined strep testing.  Work note for 2 days.  COVID, flu negative.  Discussed with patient while in the department.  Also checking urine pregnancy Urine pregnancy test negative. Sent patient MyChart note notifying her of negative pregnancy test.  2.  History of DVT.  She states that her calf pain has not changed.  States her DVT was located in the greater saphenous vein.  This is nontender, no palpable cord.  She does have posterior calf tenderness, but no calf swelling, chest pain or shortness of breath.  Advised her to start the Lovenox  for now and follow-up with her PCP to be transitioned to Eliquis or Xarelto  ASAP.  ER return precautions given.  Discussed labs MDM, treatment plan, and plan for follow-up with patient Discussed sn/sx that should prompt return to the ED. patient agrees with plan.   Meds ordered this encounter  Medications   amoxicillin -clavulanate (AUGMENTIN ) 875-125 MG tablet    Sig: Take 1 tablet by mouth every 12 (twelve) hours.    Dispense:  14 tablet    Refill:  0   promethazine -dextromethorphan (PROMETHAZINE -DM) 6.25-15  MG/5ML syrup    Sig: Take 5 mLs by mouth 4 (four) times daily as needed for cough.    Dispense:  118 mL    Refill:  0   ipratropium (ATROVENT ) 0.06 % nasal spray    Sig: Place 2 sprays into both nostrils 4 (four) times daily.    Dispense:  15 mL    Refill:  0      *This clinic note was created using Scientist, clinical (histocompatibility and immunogenetics). Therefore, there may be occasional mistakes despite careful proofreading. ?    Van Knee, MD 05/17/24 1720

## 2024-05-16 NOTE — ED Triage Notes (Signed)
 Pt reports productive cough, sore throat, and nasal/head congestion x4 days. Denies fever, chills, or GI symptoms. No med use for symptoms.

## 2024-05-18 ENCOUNTER — Other Ambulatory Visit (HOSPITAL_BASED_OUTPATIENT_CLINIC_OR_DEPARTMENT_OTHER)

## 2024-05-22 LAB — HORIZON CUSTOM: REPORT SUMMARY: POSITIVE — AB

## 2024-05-26 DIAGNOSIS — Z148 Genetic carrier of other disease: Secondary | ICD-10-CM | POA: Insufficient documentation

## 2024-05-26 NOTE — Telephone Encounter (Signed)
 I spoke with the patient to return carrier screening results. 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 She was not found to be a carrier for the other 611 conditions screened for which significantly reduces but does not eliminate the chance of being a carrier. Please see report for details.  We reviewed genes, autosomal recessive inheritance patterns, and clinical features of these conditions.  3-MCC1: Joan Knight was found to be a carrier for this as she carries the likely pathogenic variant [id number] (p.E179*) in one of her MCCC1 genes. Carriers are not expected to show symptoms. This is an autosomal recessive condition that is caused by an inability to process the amino acid leucine.   The signs and symptoms of [facility] deficiency can vary among individuals, even among individuals in the same family. Some people with the genetic changes that cause [facility] deficiency will not develop symptoms until adulthood, while many will never develop signs or symptoms. Some affected individuals develop signs and symptoms in infancy or early childhood after an event such as an infection, a long period without food, or the introduction of a high-protein diet. Features of MCC deficiency may include feeding difficulties, delayed development, vomiting, excessive tiredness (lethargy), and weak muscle tone (hypotonia). If untreated, MCC deficiency can lead to seizures; breathing difficulties; and comas, which can be life-threatening (MedlinePlus, 2025).  Of note, biallelic pathogenic variants in the MCCC2 gene also causes 3-MCC deficiency (type 2). There is currently little evidence to suggest digenic inheritance of both MCCC1 and MCCC2 genes; however, this cannot be entirely ruled out.  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  Her clinic notes report a personal history of sickle cell trait; however, the lab did not identify sickle cell trait in the patient.  We reviewed that these two conditions would not increase risk of infertility or recurrent pregnancy loss. It is recommended her future reproductive partner be screened for these two genes to determine risk to a future pregnancy.  Joan Bodily, MS, [facility] Certified Genetic Counselor [facility] [phone]

## 2024-06-07 ENCOUNTER — Ambulatory Visit: Payer: Self-pay

## 2024-06-08 ENCOUNTER — Ambulatory Visit

## 2024-06-09 ENCOUNTER — Ambulatory Visit

## 2024-06-13 ENCOUNTER — Ambulatory Visit

## 2024-06-24 ENCOUNTER — Ambulatory Visit
Admission: RE | Admit: 2024-06-24 | Discharge: 2024-06-24 | Disposition: A | Discharge status: Home / Self Care | Payer: Self-pay | Source: Ambulatory Visit | Attending: Physician Assistant | Admitting: Physician Assistant

## 2024-06-24 VITALS — BP 112/70 | HR 85 | Temp 98.4°F | Resp 18 | Wt 177.5 lb

## 2024-06-24 DIAGNOSIS — Z113 Encounter for screening for infections with a predominantly sexual mode of transmission: Secondary | ICD-10-CM | POA: Insufficient documentation

## 2024-06-24 DIAGNOSIS — L209 Atopic dermatitis, unspecified: Secondary | ICD-10-CM | POA: Diagnosis present

## 2024-06-24 DIAGNOSIS — B3731 Acute candidiasis of vulva and vagina: Secondary | ICD-10-CM | POA: Diagnosis present

## 2024-06-24 LAB — POCT URINE PREGNANCY: Preg Test, Ur: NEGATIVE

## 2024-06-24 MED ORDER — CLOBETASOL PROPIONATE 0.05 % EX CREA: 1.0000 | TOPICAL_CREAM | Freq: Two times a day (BID) | CUTANEOUS | 0 refills | Status: AC

## 2024-06-24 MED ORDER — FLUCONAZOLE 150 MG PO TABS: 150.0000 mg | ORAL_TABLET | Freq: Every day | ORAL | 0 refills | Status: AC

## 2024-06-24 NOTE — ED Triage Notes (Signed)
 Pt presents c/o vaginal discharge and exposure to STD x 2 weeks. Pt reports she is having white discharge, itch and discomfort. Last sexual encounter 06/18/24. Pt is requesting to be tested via swab and blood work.   Pt is also requesting medication refill for psoriases med.

## 2024-06-24 NOTE — ED Provider Notes (Signed)
 [name]    CSN: [id number] Arrival date & time: 06/24/24  0953      History   Chief Complaint Chief Complaint  Patient presents with   Medication Refill   Vaginal Concern    Exposure to STD    HPI Joan Knight is a 38 y.o. female.   Patient here concerned with STD testing.  Sexually active with boyfriend, last time 2 weeks ago.  Notes vaginal itchiness, thick white vaginal discharge.  Denies dysuria, frequency, urgency, hematuria, vaginal bleeding. S he would also like HIV and syphilis testing.  Also, she is requesting refill of clobetasol , she states her eczema on her abdomen is acting up.  Admits itchiness.  Denies pain, tenderness, erythema.    Past Medical History:  Diagnosis Date   Anemia    Anxiety    Depression    DVT (deep venous thrombosis) (HCC)    Had history of DVT in the right lower extremity about 5 years ago.  She tells me she was on blood thinner for about a year and then she stopped taking it.     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 Authorizing Provider  fluconazole  (DIFLUCAN ) 150 MG tablet Take 1 tablet (150 mg total) by mouth daily for 1 day. 06/24/24 06/25/24 Yes Juleen Rush, PA-C  acetaminophen  (TYLENOL ) 500 MG tablet Take 1,000 mg by mouth every 6 (six) hours as needed for mild pain, fever or headache. Patient not taking: Reported on 04/20/2024    [provider]  amoxicillin -clavulanate (AUGMENTIN ) 875-125 MG tablet Take 1 tablet by mouth every 12 (twelve) hours. 05/16/24   Van Knee, MD  enoxaparin  (LOVENOX ) 40 MG/0.4ML injection Inject 0.4 mLs (40 mg total) into the skin daily. Patient not taking: Reported on 05/16/2024 03/04/24   Rasch, Delon I, NP  hydrOXYzine  (VISTARIL ) 25 MG capsule Take 1 capsule (25 mg total) by mouth at bedtime and may repeat dose one time if needed. Patient not taking: Reported on 04/20/2024 07/24/22   Arfeen, Leni DASEN, MD  Insulin  Pen Needle 32G X 6 MM MISC Use daily. Patient not taking: Reported on 05/16/2024 03/04/24   Rasch, Delon I, NP  ipratropium (ATROVENT ) 0.06 % nasal spray Place 2 sprays into both nostrils 4 (  four) times daily. 05/16/24   Van Knee, MD  omeprazole  (PRILOSEC) 40 MG capsule Take 40 mg by mouth daily. 06/07/21   [provider]  promethazine -dextromethorphan (PROMETHAZINE -DM) 6.25-15 MG/5ML syrup Take 5 mLs by mouth 4 (four) times daily as needed for cough. 05/16/24   Van Knee, MD  sertraline  (ZOLOFT ) 50 MG tablet Take 50 mg by mouth daily. Patient not taking: Reported on 03/02/2024 05/29/21   [provider]    Family History Family History  Problem Relation Age of Onset   Asthma Mother    Anxiety disorder Mother    Depression Mother    Bronchitis Mother    Emphysema Mother    COPD Mother    Alcohol abuse Father    Heart disease Father        heart murmur   Hypertension Father    Deep vein thrombosis Father    Depression Father    Heart Problems Father    Diabetes Father    Breast cancer Maternal Aunt     Bone cancer Maternal Grandfather    Congenital heart disease Maternal Grandmother    Pancreatic cancer Neg Hx    Stomach cancer Neg Hx    Esophageal cancer Neg Hx    Colon cancer Neg Hx     Social History Social History   Tobacco Use   Smoking status: Former    Current packs/day: 0.25    Average packs/day: 0.3 packs/day for 16.0 years (4.0 ttl pk-yrs)    Types: Cigarettes    Passive exposure: Current   Smokeless tobacco: Never   Tobacco comments:    Cutting back, plans to quit   Vaping Use   Vaping status: Every Day   Substances: Nicotine , Flavoring  Substance Use Topics   Alcohol use: Yes    Comment: occ/social   Drug use: Not Currently    Types: Marijuana    Comment: Stopped once pregnant     Allergies   Patient has no known allergies.   Review of Systems Review of Systems  Constitutional:  Negative for chills, fatigue and fever.  Gastrointestinal:  Negative for abdominal pain, diarrhea, nausea and vomiting.  Genitourinary:  Positive for vaginal discharge. Negative for decreased urine volume, difficulty urinating, dyspareunia, dysuria, flank pain, frequency, hematuria, menstrual problem, pelvic pain, urgency and vaginal bleeding.  Musculoskeletal:  Negative for arthralgias, back pain and myalgias.  Skin:  Positive for rash. Negative for color change.  Neurological:  Negative for seizures and syncope.  Psychiatric/Behavioral:  Negative for sleep disturbance. The patient is not nervous/anxious.   All other systems reviewed and are negative.    Physical Exam Triage Vital Signs ED Triage Vitals  Encounter Vitals Group     BP 06/24/24 1011 112/70     Girls Systolic BP Percentile --      Girls Diastolic BP Percentile --      Boys Systolic BP Percentile --      Boys Diastolic BP Percentile --      Pulse Rate 06/24/24 1011 85     Resp 06/24/24 1011 18     Temp 06/24/24 1011 98.4 F (36.9 C)     Temp Source 06/24/24 1011 Oral     SpO2 06/24/24 1011 98 %      Weight 06/24/24 1010 177 lb 7.5 oz (80.5 kg)     Height --      Head Circumference --      Peak Flow --      Pain Score 06/24/24 1009  5     Pain Loc --      Pain Education --      Exclude from Growth Chart --    No data found.  Updated Vital Signs BP 112/70 (BP Location: Left Arm)   Pulse 85   Temp 98.4 F (36.9 C) (Oral)   Resp 18   Wt 177 lb 7.5 oz (80.5 kg)   LMP 05/26/2024 (Exact Date)   SpO2 98%   BMI 32.46 kg/m   Visual Acuity Right Eye Distance:   Left Eye Distance:   Bilateral Distance:    Right Eye Near:   Left Eye Near:    Bilateral Near:     Physical Exam Vitals and nursing note reviewed.  Constitutional:      General: She is not in acute distress.    Appearance: Normal appearance. She is not ill-appearing.  HENT:     Head: Normocephalic and atraumatic.  Eyes:     General: No scleral icterus.    Extraocular Movements: Extraocular movements intact.     Conjunctiva/sclera: Conjunctivae normal.  Pulmonary:     Effort: Pulmonary effort is normal. No respiratory distress.  Abdominal:     General: There is no distension.     Tenderness: There is no abdominal tenderness. There is no right CVA tenderness, left CVA tenderness, guarding or rebound.  Musculoskeletal:        General: Normal range of motion.     Cervical back: Normal range of motion. No rigidity.  Skin:    General: Skin is warm.     Coloration: Skin is not jaundiced.     Findings: No rash.     Comments: Slightly flaky skin abdomen, no erythema  Neurological:     General: No focal deficit present.     Mental Status: She is alert and oriented to person, place, and time.     Motor: No weakness.     Gait: Gait normal.  Psychiatric:        Mood and Affect: Mood normal.        Behavior: Behavior normal.      UC Treatments / Results  Labs (all labs ordered are listed, but only abnormal results are displayed) Labs Reviewed  POCT URINE PREGNANCY - Normal  HIV ANTIBODY (ROUTINE TESTING W  REFLEX)  RPR  CERVICOVAGINAL ANCILLARY ONLY    EKG   Radiology No results found.  Procedures Procedures (including critical care time)  Medications Ordered in UC Medications - No data to display  Initial Impression / Assessment and Plan / UC Course  I have reviewed the triage vital signs and the nursing notes.  Pertinent labs & imaging results that were available during my care of the patient were reviewed by me and considered in my medical decision making (see chart for details).     Take medication as prescribed Refilled medication - do not use steroid for more than 7 days in a row. Final Clinical Impressions(s) / UC Diagnoses   Final diagnoses:  Atopic dermatitis, unspecified type  Vaginal yeast infection  Screen for STD (sexually transmitted disease)     Discharge Instructions      Lab results available via mychart in 2 -5 days.     ED Prescriptions     Medication Sig Dispense Auth. Provider   fluconazole  (DIFLUCAN ) 150 MG tablet Take 1 tablet (150 mg total) by mouth daily for 1 day. 1 tablet Juleen Rush, PA-C      PDMP not reviewed this encounter.  Juleen Rush, PA-C 06/24/24 1059

## 2024-06-24 NOTE — Discharge Instructions (Addendum)
 Lab results available via mychart in 2 - 5 days

## 2024-06-25 LAB — RPR: RPR Ser Ql: NONREACTIVE

## 2024-06-25 LAB — HIV ANTIBODY (ROUTINE TESTING W REFLEX): HIV Screen 4th Generation wRfx: NONREACTIVE

## 2024-06-27 LAB — CERVICOVAGINAL ANCILLARY ONLY
Bacterial Vaginitis (gardnerella): NEGATIVE
Candida Glabrata: NEGATIVE
Candida Vaginitis: NEGATIVE
Chlamydia: NEGATIVE
Comment: NEGATIVE
Comment: NEGATIVE
Comment: NEGATIVE
Comment: NEGATIVE
Comment: NEGATIVE
Comment: NORMAL
Neisseria Gonorrhea: NEGATIVE
Trichomonas: NEGATIVE

## 2024-08-10 ENCOUNTER — Ambulatory Visit
Admission: RE | Admit: 2024-08-10 | Discharge: 2024-08-10 | Disposition: A | Discharge status: Home / Self Care | Source: Ambulatory Visit

## 2024-08-10 VITALS — BP 115/80 | HR 75 | Temp 98.2°F | Resp 16

## 2024-08-10 DIAGNOSIS — N760 Acute vaginitis: Secondary | ICD-10-CM

## 2024-08-10 LAB — POCT URINE DIPSTICK
Bilirubin, UA: NEGATIVE
Blood, UA: NEGATIVE
Glucose, UA: NEGATIVE mg/dL
Ketones, POC UA: NEGATIVE mg/dL
Nitrite, UA: NEGATIVE
POC PROTEIN,UA: NEGATIVE
Spec Grav, UA: 1.02 (ref 1.010–1.025)
Urobilinogen, UA: 0.2 U/dL
pH, UA: 7 (ref 5.0–8.0)

## 2024-08-10 LAB — POCT URINE PREGNANCY: Preg Test, Ur: NEGATIVE

## 2024-08-10 MED ORDER — METRONIDAZOLE 500 MG PO TABS: 500.0000 mg | ORAL_TABLET | Freq: Two times a day (BID) | ORAL | 0 refills | Status: AC

## 2024-08-10 MED ORDER — FLUCONAZOLE 150 MG PO TABS: ORAL_TABLET | ORAL | 1 refills | Status: AC

## 2024-08-10 NOTE — ED Provider Notes (Signed)
 [name]    CSN: [id number] Arrival date & time: 08/10/24  0948      History   Chief Complaint Chief Complaint  Patient presents with   Urinary Frequency   Dysuria   Vaginal Itching    HPI Joan Knight is a 38 y.o. female.   Patient presents today due to  vaginal irritation for the past week.  Patient states that she is experiencing some dysuria, vaginal itching, thick, cottage cheese like and malodorous discharge.  Patient states that she has a history of BV and states that this feels similar to when she has had BV in the past.  Patient states that she is concerned about STIs and would like to have blood work done.  Patient states she has a new sexual partner.    The history is provided by the patient.  Urinary Frequency  Dysuria Vaginal Itching    Past Medical History:  Diagnosis Date   Anemia    Anxiety    Depression    DVT (deep venous thrombosis) (HCC)    Had history of DVT in the right lower extremity about 5 years ago.  She tells me she was on blood thinner for about a year and then she stopped taking it.     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 Authorizing Provider  acetaminophen  (TYLENOL ) 500 MG tablet Take 1,000 mg by mouth every 6 (six) hours as needed for mild pain (pain score 1-3), fever or headache.   Yes [provider]  clobetasol  cream (TEMOVATE ) 0.05 % Apply 1 Application topically 2 (two) times daily. 06/24/24  Yes Juleen Rush, PA-C  enoxaparin  (LOVENOX ) 40 MG/0.4ML injection Inject 0.4 mLs (40 mg total) into the skin daily. 03/04/24  Yes Rasch, Delon I, NP  fluconazole  (DIFLUCAN ) 150 MG tablet Take 1 tab po every 3 days. 08/10/24  Yes Andra Corean BROCKS, PA-C  hydrOXYzine  (VISTARIL ) 25 MG capsule Take 1 capsule (25 mg total) by mouth at bedtime and may repeat dose one time if needed. 07/24/22  Yes Arfeen, Leni DASEN, MD  Insulin  Pen Needle 32G X 6 MM MISC Use daily. 03/04/24  Yes Rasch, Delon I, NP  metroNIDAZOLE  (FLAGYL ) 500 MG tablet Take 1 tablet (500 mg total) by mouth  2 (two) times daily. 08/10/24  Yes Andra Corean BROCKS, PA-C  omeprazole  (PRILOSEC) 40 MG capsule Take 40 mg by mouth daily. 06/07/21  Yes [provider]  sertraline  (ZOLOFT ) 50 MG tablet Take 50 mg by mouth daily. 05/29/21  Yes [provider]  ipratropium (ATROVENT ) 0.06 % nasal spray Place 2 sprays into both nostrils 4 (four) times daily. Patient not taking: Reported on 08/10/2024 05/16/24   Van Knee, MD    Family History Family History  Problem Relation Age of Onset   Asthma Mother    Anxiety disorder Mother    Depression Mother    Bronchitis Mother    Emphysema Mother    COPD Mother    Alcohol abuse Father    Heart disease Father        heart murmur   Hypertension Father    Deep vein thrombosis Father    Depression Father    Heart Problems Father    Diabetes Father    Breast cancer Maternal Aunt    Bone cancer Maternal Grandfather    Congenital heart disease Maternal Grandmother    Pancreatic  cancer Neg Hx    Stomach cancer Neg Hx    Esophageal cancer Neg Hx    Colon cancer Neg Hx     Social History Social History   Tobacco Use   Smoking status: Former    Current packs/day: 0.25    Average packs/day: 0.3 packs/day for 16.0 years (4.0 ttl pk-yrs)    Types: Cigarettes    Passive exposure: Current   Smokeless tobacco: Never   Tobacco comments:    Cutting back, plans to quit   Vaping Use   Vaping status: Every Day   Substances: Nicotine , Flavoring  Substance Use Topics   Alcohol use: Yes    Comment: occ/social   Drug use: Not Currently    Types: Marijuana    Comment: Stopped once pregnant     Allergies   Patient has no known allergies.   Review of Systems Review of Systems  Genitourinary:  Positive for dysuria and frequency.     Physical Exam Triage Vital Signs ED Triage Vitals  Encounter Vitals Group     BP 08/10/24 1010 115/80     Girls Systolic BP Percentile --      Girls Diastolic BP Percentile --      Boys Systolic BP Percentile --      Boys Diastolic BP Percentile --      Pulse Rate 08/10/24 1010 75     Resp 08/10/24 1010 16     Temp 08/10/24 1010 98.2 F (36.8 C)     Temp Source 08/10/24 1010 Oral     SpO2 08/10/24 1010 98 %     Weight --      Height --      Head Circumference --      Peak Flow --      Pain Score 08/10/24 1011 7     Pain Loc --      Pain Education --      Exclude from Growth Chart --    No data found.  Updated Vital Signs BP 115/80 (BP Location: Left Arm)   Pulse 75   Temp 98.2 F (36.8 C) (Oral)   Resp 16   LMP 07/24/2024 (Exact Date)   SpO2 98%   Visual Acuity Right Eye Distance:   Left Eye Distance:   Bilateral Distance:    Right Eye Near:   Left Eye Near:  Bilateral Near:     Physical Exam Vitals and nursing note reviewed.  Constitutional:      General: She is not in acute distress.    Appearance: Normal appearance. She is not ill-appearing, toxic-appearing or diaphoretic.  Eyes:      General: No scleral icterus. Cardiovascular:     Rate and Rhythm: Normal rate and regular rhythm.     Heart sounds: Normal heart sounds.  Pulmonary:     Effort: Pulmonary effort is normal. No respiratory distress.     Breath sounds: Normal breath sounds. No wheezing or rhonchi.  Abdominal:     General: Abdomen is flat. Bowel sounds are normal.     Palpations: Abdomen is soft.     Tenderness: There is no abdominal tenderness. There is no right CVA tenderness or left CVA tenderness.  Skin:    General: Skin is warm.  Neurological:     Mental Status: She is alert and oriented to person, place, and time.  Psychiatric:        Mood and Affect: Mood normal.        Behavior: Behavior normal.      UC Treatments / Results  Labs (all labs ordered are listed, but only abnormal results are displayed) Labs Reviewed  POCT URINE DIPSTICK - Abnormal; Notable for the following components:      Result Value   Clarity, UA cloudy (*)    Leukocytes, UA Trace (*)    All other components within normal limits  POCT URINE PREGNANCY - Normal  RPR  HIV ANTIBODY (ROUTINE TESTING W REFLEX)  CERVICOVAGINAL ANCILLARY ONLY    EKG   Radiology No results found.  Procedures Procedures (including critical care time)  Medications Ordered in UC Medications - No data to display  Initial Impression / Assessment and Plan / UC Course  I have reviewed the triage vital signs and the nursing notes.  Pertinent labs & imaging results that were available during my care of the patient were reviewed by me and considered in my medical decision making (see chart for details).     Final Clinical Impressions(s) / UC Diagnoses   Final diagnoses:  Acute vaginitis     Discharge Instructions      You have been diagnosed with a vaginal infection today. -Be sure to take the medications prescribed as directed and in their entirety. -Use of boric acid vaginal suppositories may assist in vaginal pH maintenance  and prevention of yeast and bacterial vaginosis infections, you can start by using twice a week once your symptoms have resolved.  -Be sure to wear clean, cotton underwear and change out of tight, sweaty clothes as soon as possible.     ED Prescriptions     Medication Sig Dispense Auth. Provider   fluconazole  (DIFLUCAN ) 150 MG tablet Take 1 tab po every 3 days. 2 tablet Andra Krabbe C, PA-C   metroNIDAZOLE  (FLAGYL ) 500 MG tablet Take 1 tablet (500 mg total) by mouth 2 (two) times daily. 14 tablet Andra Krabbe BROCKS, PA-C      PDMP not reviewed this encounter.   Andra Krabbe BROCKS, PA-C 08/10/24 1049

## 2024-08-10 NOTE — ED Triage Notes (Addendum)
 Pt reports dysuria, urgency, frequency, and oliguria x1 week along with vaginal itching. Denies fevers, abdominal pain, and hematuria. Denies abnormal vaginal discharge. LMP: 07/24/2024. No known exposures to STDs. Pt requests STD testing: swab & blood work. Also requests pregnancy test.  New partner, only female partners. No med use for symptoms.

## 2024-08-10 NOTE — Discharge Instructions (Signed)
 You have been diagnosed with a vaginal infection today. -Be sure to take the medications prescribed as directed and in their entirety. -Use of boric acid vaginal suppositories may assist in vaginal pH maintenance and prevention of yeast and bacterial vaginosis infections, you can start by using twice a week once your symptoms have resolved.  -Be sure to wear clean, cotton underwear and change out of tight, sweaty clothes as soon as possible.

## 2024-08-11 LAB — CERVICOVAGINAL ANCILLARY ONLY
Bacterial Vaginitis (gardnerella): NEGATIVE
Candida Glabrata: NEGATIVE
Candida Vaginitis: NEGATIVE
Chlamydia: NEGATIVE
Comment: NEGATIVE
Comment: NEGATIVE
Comment: NEGATIVE
Comment: NEGATIVE
Comment: NEGATIVE
Comment: NORMAL
Neisseria Gonorrhea: NEGATIVE
Trichomonas: NEGATIVE

## 2024-08-11 LAB — HIV ANTIBODY (ROUTINE TESTING W REFLEX): HIV Screen 4th Generation wRfx: NONREACTIVE

## 2024-08-11 LAB — RPR: RPR Ser Ql: NONREACTIVE

## 2024-09-25 ENCOUNTER — Ambulatory Visit: Payer: Self-pay

## 2024-10-05 ENCOUNTER — Other Ambulatory Visit: Payer: Self-pay

## 2024-10-05 ENCOUNTER — Ambulatory Visit
Admission: RE | Admit: 2024-10-05 | Discharge: 2024-10-05 | Disposition: A | Discharge status: Home / Self Care | Attending: Physician Assistant | Admitting: Physician Assistant

## 2024-10-05 VITALS — BP 122/82 | HR 83 | Temp 98.0°F | Resp 14 | Wt 175.0 lb

## 2024-10-05 DIAGNOSIS — R319 Hematuria, unspecified: Secondary | ICD-10-CM | POA: Diagnosis present

## 2024-10-05 DIAGNOSIS — N390 Urinary tract infection, site not specified: Secondary | ICD-10-CM | POA: Insufficient documentation

## 2024-10-05 DIAGNOSIS — R8281 Pyuria: Secondary | ICD-10-CM | POA: Diagnosis present

## 2024-10-05 DIAGNOSIS — N898 Other specified noninflammatory disorders of vagina: Secondary | ICD-10-CM | POA: Insufficient documentation

## 2024-10-05 LAB — POCT URINE PREGNANCY: Preg Test, Ur: NEGATIVE

## 2024-10-05 LAB — POCT URINE DIPSTICK
Bilirubin, UA: NEGATIVE
Glucose, UA: NEGATIVE mg/dL
Ketones, POC UA: NEGATIVE mg/dL
Nitrite, UA: NEGATIVE
POC PROTEIN,UA: 30 — AB
Spec Grav, UA: >=1.03 — AB
Urobilinogen, UA: 0.2 U/dL
pH, UA: 5

## 2024-10-05 MED ORDER — NITROFURANTOIN MONOHYD MACRO 100 MG PO CAPS: 100.0000 mg | ORAL_CAPSULE | Freq: Two times a day (BID) | ORAL | 0 refills | Status: AC

## 2024-10-05 NOTE — ED Triage Notes (Signed)
 Pt presents with c/o vaginal discharge, itching, and odors x 2-3 weeks. Also endorses burning with urination. Requesting swab and blood work today. Currently denies pain. States she is feeling discomfort. Suppositories for vaginal pH balance inserted with improvement. Last inserted about two weeks ago.

## 2024-10-05 NOTE — ED Provider Notes (Signed)
 [facility]    CSN: [id number] Arrival date & time: 10/05/24  9146      History   Chief Complaint Chief Complaint  Patient presents with   SEXUALLY TRANSMITTED DISEASE   Vaginal Discharge    HPI Joan Knight is a 38 y.o. female.   HPI  Pt is here today for STD testing. She states she is having vaginal discharge changes, vulvovaginal discomfort and vaginal odor that is concerning her for potential infection. She reports this has been ongoing for about 2-3 weeks.  She is sexually active with single female partner and she states her partner has not expressed concerns for exposure or symptoms   Past Medical History:  Diagnosis Date   Anemia    Anxiety    Depression    DVT (deep venous thrombosis) (HCC)    Had history of DVT in the right lower extremity about 5 years ago.  She tells me she was on blood thinner for about a year and then she stopped taking it.     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 Authorizing Provider  nitrofurantoin , macrocrystal-monohydrate,  (MACROBID ) 100 MG capsule Take 1 capsule (100 mg total) by mouth 2 (two) times daily for 5 days. 10/05/24 10/10/24 Yes Evelean Bigler E, PA-C  acetaminophen  (TYLENOL ) 500 MG tablet Take 1,000 mg by mouth every 6 (six) hours as needed for mild pain (pain score 1-3), fever or headache.    [provider]  clobetasol  cream (TEMOVATE ) 0.05 % Apply 1 Application topically 2 (two) times daily. 06/24/24   Juleen Rush, PA-C  enoxaparin  (LOVENOX ) 40 MG/0.4ML injection Inject 0.4 mLs (40 mg total) into the skin daily. 03/04/24   Rasch, Delon I, NP  fluconazole  (DIFLUCAN ) 150 MG tablet Take 1 tab po every 3 days. 08/10/24   Andra Corean BROCKS, PA-C  hydrOXYzine  (VISTARIL ) 25 MG capsule Take 1 capsule (25 mg total) by mouth at bedtime and may repeat dose one time if needed. 07/24/22   Arfeen, Leni DASEN, MD  Insulin  Pen Needle 32G X 6 MM MISC Use daily. 03/04/24   Rasch, Delon I, NP  ipratropium (ATROVENT ) 0.06 % nasal spray Place 2 sprays into both nostrils 4 (four) times daily. Patient not taking: Reported on 08/10/2024 05/16/24  Van Knee, MD  metroNIDAZOLE  (FLAGYL ) 500 MG tablet Take 1 tablet (500 mg total) by mouth 2 (two) times daily. 08/10/24   Andra Corean BROCKS, PA-C  omeprazole  (PRILOSEC) 40 MG capsule Take 40 mg by mouth daily. 06/07/21   [provider]  sertraline  (ZOLOFT ) 50 MG tablet Take 50 mg by mouth daily. 05/29/21   [provider]    Family History Family History  Problem Relation Age of Onset   Asthma Mother    Anxiety disorder Mother    Depression Mother    Bronchitis Mother    Emphysema Mother    COPD Mother    Alcohol abuse Father    Heart disease Father        heart murmur   Hypertension Father    Deep vein thrombosis Father    Depression Father    Heart Problems Father    Diabetes Father    Breast cancer Maternal Aunt    Bone cancer Maternal Grandfather    Congenital heart disease Maternal Grandmother    Pancreatic cancer Neg Hx     Stomach cancer Neg Hx    Esophageal cancer Neg Hx    Colon cancer Neg Hx     Social History Social History[1]   Allergies   Patient has no known allergies.   Review of Systems Review of Systems  Constitutional:  Negative for chills and fever.  Gastrointestinal:  Negative for abdominal pain.  Genitourinary:  Positive for dysuria, vaginal discharge and vaginal pain. Negative for flank pain, genital sores, pelvic pain and vaginal bleeding.     Physical Exam Triage Vital Signs ED Triage Vitals  Encounter Vitals Group     BP 10/05/24 0908 122/82     Girls Systolic BP Percentile --      Girls Diastolic BP Percentile --      Boys Systolic BP Percentile --      Boys Diastolic BP Percentile --      Pulse Rate 10/05/24 0908 83     Resp 10/05/24 0908 14     Temp 10/05/24 0908 98 F (36.7 C)     Temp Source 10/05/24 0908 Oral     SpO2 10/05/24 0908 97 %     Weight 10/05/24 0908 175 lb (79.4 kg)     Height --      Head Circumference --      Peak Flow --      Pain Score 10/05/24 0917 0     Pain Loc --      Pain Education --      Exclude from Growth Chart --    No data found.  Updated Vital Signs BP 122/82 (BP Location: Right Arm)   Pulse 83   Temp 98 F (36.7 C) (Oral)   Resp 14   Wt 175 lb (79.4 kg)   LMP 09/23/2024 (Exact Date)   SpO2 97%   BMI 32.01 kg/m   Visual Acuity Right Eye Distance:   Left Eye Distance:   Bilateral Distance:    Right Eye Near:   Left Eye Near:    Bilateral Near:     Physical Exam Vitals reviewed.  Constitutional:      General: She is awake.     Appearance: Normal appearance. She is well-developed and well-groomed.  HENT:     Head: Normocephalic and atraumatic.  Eyes:     General: Lids are normal. Gaze aligned appropriately.     Extraocular Movements: Extraocular movements intact.  Conjunctiva/sclera: Conjunctivae normal.  Pulmonary:     Effort: Pulmonary effort is normal.  Neurological:     Mental Status: She is alert  and oriented to person, place, and time.  Psychiatric:        Attention and Perception: Attention and perception normal.        Mood and Affect: Mood and affect normal.        Speech: Speech normal.        Behavior: Behavior normal. Behavior is cooperative.      UC Treatments / Results  Labs (all labs ordered are listed, but only abnormal results are displayed) Labs Reviewed  POCT URINE DIPSTICK - Abnormal; Notable for the following components:      Result Value   Spec Grav, UA >=1.030 (*)    Blood, UA trace-intact (*)    POC PROTEIN,UA =30 (*)    Leukocytes, UA Large (3+) (*)    All other components within normal limits  URINE CULTURE  SYPHILIS: RPR W/REFLEX TO RPR TITER AND TREPONEMAL ANTIBODIES, TRADITIONAL SCREENING AND DIAGNOSIS ALGORITHM  HIV ANTIBODY (ROUTINE TESTING W REFLEX)  POCT URINE PREGNANCY  CERVICOVAGINAL ANCILLARY ONLY    EKG   Radiology No results found.  Procedures Procedures (including critical care time)  Medications Ordered in UC Medications - No data to display  Initial Impression / Assessment and Plan / UC Course  I have reviewed the triage vital signs and the nursing notes.  Pertinent labs & imaging results that were available during my care of the patient were reviewed by me and considered in my medical decision making (see chart for details).      Final Clinical Impressions(s) / UC Diagnoses   Final diagnoses:  Vaginal discharge  Pyuria  Urinary tract infection with hematuria, site unspecified     Discharge Instructions      Based on your symptoms and results of the urinalysis I believe you have a UTI I recommend the following:  I have sent in a script for macrobid  100 mg to be taken by mouth twice per day for 5 days. Please finish the entire course of the antibiotic even if you are feeling better before it is completed unless you develop an allergic reaction or are told by a medical provider to stop taking it. We have sent a  sample of your urine off for a urine culture.  This will help us  determine what bacteria is causing your symptoms as well as the most appropriate antibiotic to treat it.  If we need to make any adjustments to your medication regimen and new medication will be sent to the pharmacy on file and you will be updated via phone call in MyChart. Stay well hydrated (at least 75 oz of water per day) and avoid holding your urine for prolonged periods of time. If you have any of the following please return to urgent care or go to the emergency room: Persistent symptoms, fever, trouble urinating or inability to urinate, confusion, flank pain.   You were seen today for STD screening.  We collected a cervicovaginal swab that we will assess for gonorrhea, chlamydia, trichomonas, bacterial vaginosis, yeast.  If collected blood work that we will assess for HIV and syphilis.  We will keep you updated with these results once they are available.  If any medications are indicated by those test results we will call you and medications will either be sent to the pharmacy on file or you can return to the urgent care for  an injection.  It is recommended that you refrain from sexual activity until your test results are negative or until you have completed an appropriate medication regimen as dictated by your test results.  Please use a condom or another barrier method to help prevent STD transmission.  Please make sure that you communicate with your partners regarding your test results should any positive results, about as they will also need to be tested and screened.       ED Prescriptions     Medication Sig Dispense Auth. Provider   nitrofurantoin , macrocrystal-monohydrate, (MACROBID ) 100 MG capsule Take 1 capsule (100 mg total) by mouth 2 (two) times daily for 5 days. 10 capsule Jennetta Flood E, PA-C      PDMP not reviewed this encounter.    [1]  Social History Tobacco Use   Smoking status: Former    Current  packs/day: 0.25    Average packs/day: 0.3 packs/day for 16.0 years (4.0 ttl pk-yrs)    Types: Cigarettes    Passive exposure: Current   Smokeless tobacco: Never   Tobacco comments:    Cutting back, plans to quit   Vaping Use   Vaping status: Every Day   Substances: Nicotine , Flavoring  Substance Use Topics   Alcohol use: Yes    Comment: occ/social   Drug use: Not Currently    Types: Marijuana    Comment: Stopped once pregnant   "

## 2024-10-05 NOTE — ED Notes (Signed)
 Pt needing to provide more urine to obtain culture. Water provided.

## 2024-10-05 NOTE — Discharge Instructions (Addendum)
 Based on your symptoms and results of the urinalysis I believe you have a UTI I recommend the following:  I have sent in a script for macrobid  100 mg to be taken by mouth twice per day for 5 days  Please finish the entire course of the antibiotic even if you are feeling better before it is completed unless you develop an allergic reaction or are told by a medical provider to stop taking it. We have sent a sample of your urine off for a urine culture.  This will help us  determine what bacteria is causing your symptoms as well as the most appropriate antibiotic to treat it.  If we need to make any adjustments to your medication regimen and new medication will be sent to the pharmacy on file and you will be updated via phone call in MyChart. Stay well hydrated (at least 75 oz of water per day) and avoid holding your urine for prolonged periods of time. If you have any of the following please return to urgent care or go to the emergency room: Persistent symptoms, fever, trouble urinating or inability to urinate, confusion, flank pain.   You were seen today for STD screening.  We collected a cervicovaginal swab that we will assess for gonorrhea, chlamydia, trichomonas, bacterial vaginosis, yeast.  If collected blood work that we will assess for HIV and syphilis.  We will keep you updated with these results once they are available.  If any medications are indicated by those test results we will call you and medications will either be sent to the pharmacy on file or you can return to the urgent care for an injection.  It is recommended that you refrain from sexual activity until your test results are negative or until you have completed an appropriate medication regimen as dictated by your test results.  Please use a condom or another barrier method to help prevent STD transmission.  Please make sure that you communicate with your partners regarding your test results should any positive results, about as they will  also need to be tested and screened.

## 2024-10-06 LAB — HIV ANTIBODY (ROUTINE TESTING W REFLEX): HIV Screen 4th Generation wRfx: NONREACTIVE

## 2024-10-06 LAB — SYPHILIS: RPR W/REFLEX TO RPR TITER AND TREPONEMAL ANTIBODIES, TRADITIONAL SCREENING AND DIAGNOSIS ALGORITHM: RPR Ser Ql: NONREACTIVE

## 2024-10-07 ENCOUNTER — Ambulatory Visit (HOSPITAL_COMMUNITY): Payer: Self-pay

## 2024-10-07 LAB — CERVICOVAGINAL ANCILLARY ONLY
Bacterial Vaginitis (gardnerella): NEGATIVE
Candida Glabrata: NEGATIVE
Candida Vaginitis: NEGATIVE
Chlamydia: NEGATIVE
Comment: NEGATIVE
Comment: NEGATIVE
Comment: NEGATIVE
Comment: NEGATIVE
Comment: NEGATIVE
Comment: NORMAL
Neisseria Gonorrhea: NEGATIVE
Trichomonas: NEGATIVE

## 2024-10-07 LAB — URINE CULTURE

## 2024-10-10 ENCOUNTER — Ambulatory Visit
